# Patient Record
Sex: Male | Born: 1960
Health system: Southern US, Community
[De-identification: ages and names within clinical notes are randomized; demographics above are authoritative.]

## PROBLEM LIST (undated history)

## (undated) DIAGNOSIS — I509 Heart failure, unspecified: Secondary | ICD-10-CM

## (undated) DIAGNOSIS — I4891 Unspecified atrial fibrillation: Secondary | ICD-10-CM

## (undated) DIAGNOSIS — I10 Essential (primary) hypertension: Secondary | ICD-10-CM

## (undated) DIAGNOSIS — K760 Fatty (change of) liver, not elsewhere classified: Secondary | ICD-10-CM

## (undated) DIAGNOSIS — G4730 Sleep apnea, unspecified: Secondary | ICD-10-CM

## (undated) HISTORY — DX: Fatty (change of) liver, not elsewhere classified: K76.0

## (undated) HISTORY — DX: Essential (primary) hypertension: I10

## (undated) HISTORY — DX: Unspecified atrial fibrillation: I48.91

## (undated) HISTORY — PX: WISDOM TOOTH EXTRACTION: SHX21

## (undated) HISTORY — DX: Sleep apnea, unspecified: G47.30

## (undated) HISTORY — PX: TONSILLECTOMY: SUR1361

## (undated) HISTORY — PX: KNEE ARTHROPLASTY: SHX992

## (undated) HISTORY — PX: HIP ARTHROPLASTY: SHX981

---

## 2004-07-05 ENCOUNTER — Inpatient Hospital Stay (HOSPITAL_COMMUNITY): Admission: RE | Admit: 2004-07-05 | Discharge: 2004-07-09 | Payer: Self-pay | Admitting: Orthopedic Surgery

## 2009-12-19 ENCOUNTER — Ambulatory Visit: Payer: Self-pay | Admitting: Cardiology

## 2014-06-10 ENCOUNTER — Encounter (INDEPENDENT_AMBULATORY_CARE_PROVIDER_SITE_OTHER): Payer: Self-pay | Admitting: Ophthalmology

## 2014-06-14 ENCOUNTER — Encounter (INDEPENDENT_AMBULATORY_CARE_PROVIDER_SITE_OTHER): Payer: PRIVATE HEALTH INSURANCE | Admitting: Ophthalmology

## 2014-06-14 DIAGNOSIS — H35039 Hypertensive retinopathy, unspecified eye: Secondary | ICD-10-CM

## 2014-06-14 DIAGNOSIS — H2510 Age-related nuclear cataract, unspecified eye: Secondary | ICD-10-CM

## 2014-06-14 DIAGNOSIS — H43819 Vitreous degeneration, unspecified eye: Secondary | ICD-10-CM

## 2014-06-14 DIAGNOSIS — I10 Essential (primary) hypertension: Secondary | ICD-10-CM

## 2014-07-09 DIAGNOSIS — M545 Low back pain, unspecified: Secondary | ICD-10-CM | POA: Diagnosis not present

## 2014-07-09 DIAGNOSIS — E669 Obesity, unspecified: Secondary | ICD-10-CM | POA: Diagnosis not present

## 2014-07-09 DIAGNOSIS — I10 Essential (primary) hypertension: Secondary | ICD-10-CM | POA: Diagnosis not present

## 2014-07-09 DIAGNOSIS — J45902 Unspecified asthma with status asthmaticus: Secondary | ICD-10-CM | POA: Diagnosis not present

## 2014-07-09 DIAGNOSIS — Z5181 Encounter for therapeutic drug level monitoring: Secondary | ICD-10-CM | POA: Diagnosis not present

## 2014-07-09 DIAGNOSIS — G4733 Obstructive sleep apnea (adult) (pediatric): Secondary | ICD-10-CM | POA: Diagnosis not present

## 2014-07-09 DIAGNOSIS — F17200 Nicotine dependence, unspecified, uncomplicated: Secondary | ICD-10-CM | POA: Diagnosis not present

## 2014-07-09 DIAGNOSIS — Z79899 Other long term (current) drug therapy: Secondary | ICD-10-CM | POA: Diagnosis not present

## 2014-09-06 DIAGNOSIS — I10 Essential (primary) hypertension: Secondary | ICD-10-CM | POA: Diagnosis not present

## 2014-12-28 DIAGNOSIS — J4542 Moderate persistent asthma with status asthmaticus: Secondary | ICD-10-CM | POA: Diagnosis not present

## 2014-12-28 DIAGNOSIS — F331 Major depressive disorder, recurrent, moderate: Secondary | ICD-10-CM | POA: Diagnosis not present

## 2014-12-28 DIAGNOSIS — F17210 Nicotine dependence, cigarettes, uncomplicated: Secondary | ICD-10-CM | POA: Diagnosis not present

## 2014-12-28 DIAGNOSIS — F5221 Male erectile disorder: Secondary | ICD-10-CM | POA: Diagnosis not present

## 2014-12-28 DIAGNOSIS — I10 Essential (primary) hypertension: Secondary | ICD-10-CM | POA: Diagnosis not present

## 2014-12-28 DIAGNOSIS — Z1389 Encounter for screening for other disorder: Secondary | ICD-10-CM | POA: Diagnosis not present

## 2014-12-28 DIAGNOSIS — Z9189 Other specified personal risk factors, not elsewhere classified: Secondary | ICD-10-CM | POA: Diagnosis not present

## 2014-12-28 DIAGNOSIS — E6609 Other obesity due to excess calories: Secondary | ICD-10-CM | POA: Diagnosis not present

## 2014-12-28 DIAGNOSIS — G4733 Obstructive sleep apnea (adult) (pediatric): Secondary | ICD-10-CM | POA: Diagnosis not present

## 2014-12-28 DIAGNOSIS — K210 Gastro-esophageal reflux disease with esophagitis: Secondary | ICD-10-CM | POA: Diagnosis not present

## 2014-12-28 DIAGNOSIS — M545 Low back pain: Secondary | ICD-10-CM | POA: Diagnosis not present

## 2015-04-06 DIAGNOSIS — F17210 Nicotine dependence, cigarettes, uncomplicated: Secondary | ICD-10-CM | POA: Diagnosis not present

## 2015-04-06 DIAGNOSIS — F331 Major depressive disorder, recurrent, moderate: Secondary | ICD-10-CM | POA: Diagnosis not present

## 2015-04-06 DIAGNOSIS — E6609 Other obesity due to excess calories: Secondary | ICD-10-CM | POA: Diagnosis not present

## 2015-04-06 DIAGNOSIS — I10 Essential (primary) hypertension: Secondary | ICD-10-CM | POA: Diagnosis not present

## 2015-04-06 DIAGNOSIS — K210 Gastro-esophageal reflux disease with esophagitis: Secondary | ICD-10-CM | POA: Diagnosis not present

## 2015-04-13 DIAGNOSIS — R1011 Right upper quadrant pain: Secondary | ICD-10-CM | POA: Diagnosis not present

## 2015-04-13 DIAGNOSIS — K219 Gastro-esophageal reflux disease without esophagitis: Secondary | ICD-10-CM | POA: Diagnosis not present

## 2015-04-13 DIAGNOSIS — F331 Major depressive disorder, recurrent, moderate: Secondary | ICD-10-CM | POA: Diagnosis not present

## 2015-04-13 DIAGNOSIS — E6609 Other obesity due to excess calories: Secondary | ICD-10-CM | POA: Diagnosis not present

## 2015-04-13 DIAGNOSIS — G4733 Obstructive sleep apnea (adult) (pediatric): Secondary | ICD-10-CM | POA: Diagnosis not present

## 2015-04-13 DIAGNOSIS — I10 Essential (primary) hypertension: Secondary | ICD-10-CM | POA: Diagnosis not present

## 2015-04-13 DIAGNOSIS — F17210 Nicotine dependence, cigarettes, uncomplicated: Secondary | ICD-10-CM | POA: Diagnosis not present

## 2015-04-13 DIAGNOSIS — F5221 Male erectile disorder: Secondary | ICD-10-CM | POA: Diagnosis not present

## 2015-04-13 DIAGNOSIS — M545 Low back pain: Secondary | ICD-10-CM | POA: Diagnosis not present

## 2015-04-20 DIAGNOSIS — I10 Essential (primary) hypertension: Secondary | ICD-10-CM | POA: Diagnosis not present

## 2015-05-06 DIAGNOSIS — K219 Gastro-esophageal reflux disease without esophagitis: Secondary | ICD-10-CM | POA: Diagnosis not present

## 2015-05-06 DIAGNOSIS — I10 Essential (primary) hypertension: Secondary | ICD-10-CM | POA: Diagnosis not present

## 2015-05-09 DIAGNOSIS — I10 Essential (primary) hypertension: Secondary | ICD-10-CM | POA: Diagnosis not present

## 2015-07-19 DIAGNOSIS — F331 Major depressive disorder, recurrent, moderate: Secondary | ICD-10-CM | POA: Diagnosis not present

## 2015-07-19 DIAGNOSIS — F17210 Nicotine dependence, cigarettes, uncomplicated: Secondary | ICD-10-CM | POA: Diagnosis not present

## 2015-07-19 DIAGNOSIS — E6609 Other obesity due to excess calories: Secondary | ICD-10-CM | POA: Diagnosis not present

## 2015-07-19 DIAGNOSIS — I10 Essential (primary) hypertension: Secondary | ICD-10-CM | POA: Diagnosis not present

## 2015-07-19 DIAGNOSIS — K219 Gastro-esophageal reflux disease without esophagitis: Secondary | ICD-10-CM | POA: Diagnosis not present

## 2015-07-19 DIAGNOSIS — F5221 Male erectile disorder: Secondary | ICD-10-CM | POA: Diagnosis not present

## 2015-07-19 DIAGNOSIS — M545 Low back pain: Secondary | ICD-10-CM | POA: Diagnosis not present

## 2015-07-19 DIAGNOSIS — G4733 Obstructive sleep apnea (adult) (pediatric): Secondary | ICD-10-CM | POA: Diagnosis not present

## 2015-08-24 DIAGNOSIS — F5221 Male erectile disorder: Secondary | ICD-10-CM | POA: Diagnosis not present

## 2015-08-24 DIAGNOSIS — I10 Essential (primary) hypertension: Secondary | ICD-10-CM | POA: Diagnosis not present

## 2015-08-24 DIAGNOSIS — M545 Low back pain: Secondary | ICD-10-CM | POA: Diagnosis not present

## 2015-08-24 DIAGNOSIS — K219 Gastro-esophageal reflux disease without esophagitis: Secondary | ICD-10-CM | POA: Diagnosis not present

## 2015-08-24 DIAGNOSIS — E6609 Other obesity due to excess calories: Secondary | ICD-10-CM | POA: Diagnosis not present

## 2015-08-24 DIAGNOSIS — F17210 Nicotine dependence, cigarettes, uncomplicated: Secondary | ICD-10-CM | POA: Diagnosis not present

## 2015-08-24 DIAGNOSIS — G4733 Obstructive sleep apnea (adult) (pediatric): Secondary | ICD-10-CM | POA: Diagnosis not present

## 2015-08-24 DIAGNOSIS — F331 Major depressive disorder, recurrent, moderate: Secondary | ICD-10-CM | POA: Diagnosis not present

## 2015-08-24 DIAGNOSIS — Z23 Encounter for immunization: Secondary | ICD-10-CM | POA: Diagnosis not present

## 2015-09-22 DIAGNOSIS — J209 Acute bronchitis, unspecified: Secondary | ICD-10-CM | POA: Diagnosis not present

## 2015-09-22 DIAGNOSIS — J019 Acute sinusitis, unspecified: Secondary | ICD-10-CM | POA: Diagnosis not present

## 2015-09-26 DIAGNOSIS — R0602 Shortness of breath: Secondary | ICD-10-CM | POA: Diagnosis not present

## 2015-09-26 DIAGNOSIS — I4892 Unspecified atrial flutter: Secondary | ICD-10-CM | POA: Diagnosis not present

## 2015-09-26 DIAGNOSIS — G4733 Obstructive sleep apnea (adult) (pediatric): Secondary | ICD-10-CM | POA: Diagnosis not present

## 2015-09-26 DIAGNOSIS — I10 Essential (primary) hypertension: Secondary | ICD-10-CM | POA: Diagnosis not present

## 2015-09-26 DIAGNOSIS — I253 Aneurysm of heart: Secondary | ICD-10-CM | POA: Diagnosis not present

## 2015-09-26 DIAGNOSIS — G8929 Other chronic pain: Secondary | ICD-10-CM | POA: Diagnosis present

## 2015-09-26 DIAGNOSIS — Z79899 Other long term (current) drug therapy: Secondary | ICD-10-CM | POA: Diagnosis not present

## 2015-09-26 DIAGNOSIS — Z79891 Long term (current) use of opiate analgesic: Secondary | ICD-10-CM | POA: Diagnosis not present

## 2015-09-26 DIAGNOSIS — F329 Major depressive disorder, single episode, unspecified: Secondary | ICD-10-CM | POA: Diagnosis present

## 2015-09-26 DIAGNOSIS — M549 Dorsalgia, unspecified: Secondary | ICD-10-CM | POA: Diagnosis present

## 2015-09-26 DIAGNOSIS — Z7952 Long term (current) use of systemic steroids: Secondary | ICD-10-CM | POA: Diagnosis not present

## 2015-09-26 DIAGNOSIS — I2510 Atherosclerotic heart disease of native coronary artery without angina pectoris: Secondary | ICD-10-CM | POA: Diagnosis present

## 2015-09-26 DIAGNOSIS — Z6841 Body Mass Index (BMI) 40.0 and over, adult: Secondary | ICD-10-CM | POA: Diagnosis not present

## 2015-09-26 DIAGNOSIS — E8770 Fluid overload, unspecified: Secondary | ICD-10-CM | POA: Diagnosis not present

## 2015-09-26 DIAGNOSIS — R600 Localized edema: Secondary | ICD-10-CM | POA: Diagnosis not present

## 2015-09-26 DIAGNOSIS — I340 Nonrheumatic mitral (valve) insufficiency: Secondary | ICD-10-CM | POA: Diagnosis not present

## 2015-09-26 DIAGNOSIS — I4891 Unspecified atrial fibrillation: Secondary | ICD-10-CM | POA: Insufficient documentation

## 2015-09-26 DIAGNOSIS — Z9889 Other specified postprocedural states: Secondary | ICD-10-CM | POA: Diagnosis not present

## 2015-09-26 DIAGNOSIS — Z7951 Long term (current) use of inhaled steroids: Secondary | ICD-10-CM | POA: Diagnosis not present

## 2015-09-26 DIAGNOSIS — Z7982 Long term (current) use of aspirin: Secondary | ICD-10-CM | POA: Diagnosis not present

## 2015-09-26 DIAGNOSIS — Z87891 Personal history of nicotine dependence: Secondary | ICD-10-CM | POA: Diagnosis not present

## 2015-09-26 DIAGNOSIS — R0682 Tachypnea, not elsewhere classified: Secondary | ICD-10-CM | POA: Diagnosis not present

## 2015-10-11 DIAGNOSIS — I4891 Unspecified atrial fibrillation: Secondary | ICD-10-CM | POA: Diagnosis not present

## 2015-10-11 DIAGNOSIS — R0602 Shortness of breath: Secondary | ICD-10-CM | POA: Diagnosis not present

## 2015-10-17 DIAGNOSIS — F331 Major depressive disorder, recurrent, moderate: Secondary | ICD-10-CM | POA: Diagnosis not present

## 2015-10-17 DIAGNOSIS — I480 Paroxysmal atrial fibrillation: Secondary | ICD-10-CM | POA: Diagnosis not present

## 2015-10-17 DIAGNOSIS — I5031 Acute diastolic (congestive) heart failure: Secondary | ICD-10-CM | POA: Diagnosis not present

## 2015-10-17 DIAGNOSIS — K219 Gastro-esophageal reflux disease without esophagitis: Secondary | ICD-10-CM | POA: Diagnosis not present

## 2015-10-17 DIAGNOSIS — F17210 Nicotine dependence, cigarettes, uncomplicated: Secondary | ICD-10-CM | POA: Diagnosis not present

## 2015-10-17 DIAGNOSIS — G4733 Obstructive sleep apnea (adult) (pediatric): Secondary | ICD-10-CM | POA: Diagnosis not present

## 2015-10-17 DIAGNOSIS — I10 Essential (primary) hypertension: Secondary | ICD-10-CM | POA: Diagnosis not present

## 2015-10-17 DIAGNOSIS — M545 Low back pain: Secondary | ICD-10-CM | POA: Diagnosis not present

## 2015-12-12 DIAGNOSIS — I4891 Unspecified atrial fibrillation: Secondary | ICD-10-CM | POA: Diagnosis not present

## 2016-01-24 DIAGNOSIS — K219 Gastro-esophageal reflux disease without esophagitis: Secondary | ICD-10-CM | POA: Diagnosis not present

## 2016-01-24 DIAGNOSIS — F5221 Male erectile disorder: Secondary | ICD-10-CM | POA: Diagnosis not present

## 2016-01-24 DIAGNOSIS — F331 Major depressive disorder, recurrent, moderate: Secondary | ICD-10-CM | POA: Diagnosis not present

## 2016-01-24 DIAGNOSIS — F17210 Nicotine dependence, cigarettes, uncomplicated: Secondary | ICD-10-CM | POA: Diagnosis not present

## 2016-01-24 DIAGNOSIS — E6609 Other obesity due to excess calories: Secondary | ICD-10-CM | POA: Diagnosis not present

## 2016-01-24 DIAGNOSIS — G4733 Obstructive sleep apnea (adult) (pediatric): Secondary | ICD-10-CM | POA: Diagnosis not present

## 2016-01-24 DIAGNOSIS — E876 Hypokalemia: Secondary | ICD-10-CM | POA: Diagnosis not present

## 2016-01-24 DIAGNOSIS — Z9189 Other specified personal risk factors, not elsewhere classified: Secondary | ICD-10-CM | POA: Diagnosis not present

## 2016-01-24 DIAGNOSIS — I10 Essential (primary) hypertension: Secondary | ICD-10-CM | POA: Diagnosis not present

## 2016-01-24 DIAGNOSIS — I480 Paroxysmal atrial fibrillation: Secondary | ICD-10-CM | POA: Diagnosis not present

## 2016-02-28 DIAGNOSIS — G4733 Obstructive sleep apnea (adult) (pediatric): Secondary | ICD-10-CM | POA: Diagnosis not present

## 2016-02-28 DIAGNOSIS — F331 Major depressive disorder, recurrent, moderate: Secondary | ICD-10-CM | POA: Diagnosis not present

## 2016-02-28 DIAGNOSIS — I10 Essential (primary) hypertension: Secondary | ICD-10-CM | POA: Diagnosis not present

## 2016-02-28 DIAGNOSIS — K219 Gastro-esophageal reflux disease without esophagitis: Secondary | ICD-10-CM | POA: Diagnosis not present

## 2016-04-04 DIAGNOSIS — Z1211 Encounter for screening for malignant neoplasm of colon: Secondary | ICD-10-CM | POA: Diagnosis not present

## 2016-04-30 DIAGNOSIS — G4733 Obstructive sleep apnea (adult) (pediatric): Secondary | ICD-10-CM | POA: Insufficient documentation

## 2016-04-30 DIAGNOSIS — I517 Cardiomegaly: Secondary | ICD-10-CM | POA: Diagnosis not present

## 2016-04-30 DIAGNOSIS — Z720 Tobacco use: Secondary | ICD-10-CM | POA: Insufficient documentation

## 2016-04-30 DIAGNOSIS — Z7952 Long term (current) use of systemic steroids: Secondary | ICD-10-CM | POA: Diagnosis not present

## 2016-04-30 DIAGNOSIS — J811 Chronic pulmonary edema: Secondary | ICD-10-CM | POA: Diagnosis not present

## 2016-04-30 DIAGNOSIS — Z79891 Long term (current) use of opiate analgesic: Secondary | ICD-10-CM | POA: Diagnosis not present

## 2016-04-30 DIAGNOSIS — K219 Gastro-esophageal reflux disease without esophagitis: Secondary | ICD-10-CM | POA: Insufficient documentation

## 2016-04-30 DIAGNOSIS — R0602 Shortness of breath: Secondary | ICD-10-CM | POA: Diagnosis not present

## 2016-04-30 DIAGNOSIS — I4891 Unspecified atrial fibrillation: Secondary | ICD-10-CM | POA: Diagnosis not present

## 2016-04-30 DIAGNOSIS — Z79899 Other long term (current) drug therapy: Secondary | ICD-10-CM | POA: Diagnosis not present

## 2016-04-30 DIAGNOSIS — R079 Chest pain, unspecified: Secondary | ICD-10-CM | POA: Diagnosis not present

## 2016-04-30 DIAGNOSIS — I10 Essential (primary) hypertension: Secondary | ICD-10-CM | POA: Insufficient documentation

## 2016-04-30 DIAGNOSIS — E876 Hypokalemia: Secondary | ICD-10-CM | POA: Diagnosis not present

## 2016-04-30 DIAGNOSIS — F329 Major depressive disorder, single episode, unspecified: Secondary | ICD-10-CM | POA: Diagnosis not present

## 2016-04-30 DIAGNOSIS — Z7951 Long term (current) use of inhaled steroids: Secondary | ICD-10-CM | POA: Diagnosis not present

## 2016-04-30 DIAGNOSIS — Z7982 Long term (current) use of aspirin: Secondary | ICD-10-CM | POA: Diagnosis not present

## 2016-05-01 DIAGNOSIS — G4733 Obstructive sleep apnea (adult) (pediatric): Secondary | ICD-10-CM | POA: Diagnosis not present

## 2016-05-01 DIAGNOSIS — Z9989 Dependence on other enabling machines and devices: Secondary | ICD-10-CM | POA: Diagnosis not present

## 2016-05-01 DIAGNOSIS — I10 Essential (primary) hypertension: Secondary | ICD-10-CM | POA: Diagnosis not present

## 2016-05-01 DIAGNOSIS — I4891 Unspecified atrial fibrillation: Secondary | ICD-10-CM | POA: Diagnosis not present

## 2016-05-02 DIAGNOSIS — I4891 Unspecified atrial fibrillation: Secondary | ICD-10-CM | POA: Diagnosis not present

## 2016-05-02 DIAGNOSIS — G4733 Obstructive sleep apnea (adult) (pediatric): Secondary | ICD-10-CM | POA: Diagnosis not present

## 2016-05-02 DIAGNOSIS — Z9989 Dependence on other enabling machines and devices: Secondary | ICD-10-CM | POA: Diagnosis not present

## 2016-05-02 DIAGNOSIS — I10 Essential (primary) hypertension: Secondary | ICD-10-CM | POA: Diagnosis not present

## 2016-05-03 DIAGNOSIS — Z452 Encounter for adjustment and management of vascular access device: Secondary | ICD-10-CM | POA: Diagnosis not present

## 2016-05-03 DIAGNOSIS — J811 Chronic pulmonary edema: Secondary | ICD-10-CM | POA: Diagnosis not present

## 2016-05-03 DIAGNOSIS — I517 Cardiomegaly: Secondary | ICD-10-CM | POA: Diagnosis not present

## 2016-05-03 DIAGNOSIS — I4891 Unspecified atrial fibrillation: Secondary | ICD-10-CM | POA: Diagnosis not present

## 2016-05-04 DIAGNOSIS — I4891 Unspecified atrial fibrillation: Secondary | ICD-10-CM | POA: Diagnosis not present

## 2016-05-05 DIAGNOSIS — I10 Essential (primary) hypertension: Secondary | ICD-10-CM | POA: Diagnosis not present

## 2016-05-05 DIAGNOSIS — I4891 Unspecified atrial fibrillation: Secondary | ICD-10-CM | POA: Diagnosis not present

## 2016-05-06 DIAGNOSIS — I10 Essential (primary) hypertension: Secondary | ICD-10-CM | POA: Diagnosis not present

## 2016-05-06 DIAGNOSIS — I4891 Unspecified atrial fibrillation: Secondary | ICD-10-CM | POA: Diagnosis not present

## 2016-05-07 DIAGNOSIS — I4891 Unspecified atrial fibrillation: Secondary | ICD-10-CM | POA: Diagnosis not present

## 2016-05-07 DIAGNOSIS — I10 Essential (primary) hypertension: Secondary | ICD-10-CM | POA: Diagnosis not present

## 2016-05-25 DIAGNOSIS — I340 Nonrheumatic mitral (valve) insufficiency: Secondary | ICD-10-CM | POA: Insufficient documentation

## 2016-05-25 DIAGNOSIS — I480 Paroxysmal atrial fibrillation: Secondary | ICD-10-CM | POA: Insufficient documentation

## 2016-05-25 DIAGNOSIS — I4891 Unspecified atrial fibrillation: Secondary | ICD-10-CM | POA: Diagnosis not present

## 2016-05-25 DIAGNOSIS — I493 Ventricular premature depolarization: Secondary | ICD-10-CM | POA: Diagnosis not present

## 2016-05-25 DIAGNOSIS — R9431 Abnormal electrocardiogram [ECG] [EKG]: Secondary | ICD-10-CM | POA: Diagnosis not present

## 2016-05-30 DIAGNOSIS — I480 Paroxysmal atrial fibrillation: Secondary | ICD-10-CM | POA: Diagnosis not present

## 2016-05-30 DIAGNOSIS — Z9989 Dependence on other enabling machines and devices: Secondary | ICD-10-CM | POA: Diagnosis not present

## 2016-05-30 DIAGNOSIS — I10 Essential (primary) hypertension: Secondary | ICD-10-CM | POA: Diagnosis not present

## 2016-05-30 DIAGNOSIS — I340 Nonrheumatic mitral (valve) insufficiency: Secondary | ICD-10-CM | POA: Diagnosis not present

## 2016-05-30 DIAGNOSIS — G4733 Obstructive sleep apnea (adult) (pediatric): Secondary | ICD-10-CM | POA: Diagnosis not present

## 2016-06-04 DIAGNOSIS — F17210 Nicotine dependence, cigarettes, uncomplicated: Secondary | ICD-10-CM | POA: Diagnosis not present

## 2016-06-04 DIAGNOSIS — I10 Essential (primary) hypertension: Secondary | ICD-10-CM | POA: Diagnosis not present

## 2016-06-04 DIAGNOSIS — I5031 Acute diastolic (congestive) heart failure: Secondary | ICD-10-CM | POA: Diagnosis not present

## 2016-06-04 DIAGNOSIS — I480 Paroxysmal atrial fibrillation: Secondary | ICD-10-CM | POA: Diagnosis not present

## 2016-06-04 DIAGNOSIS — G4733 Obstructive sleep apnea (adult) (pediatric): Secondary | ICD-10-CM | POA: Diagnosis not present

## 2016-06-04 DIAGNOSIS — F331 Major depressive disorder, recurrent, moderate: Secondary | ICD-10-CM | POA: Diagnosis not present

## 2016-06-04 DIAGNOSIS — K219 Gastro-esophageal reflux disease without esophagitis: Secondary | ICD-10-CM | POA: Diagnosis not present

## 2016-06-11 DIAGNOSIS — R0602 Shortness of breath: Secondary | ICD-10-CM | POA: Diagnosis not present

## 2016-07-04 DIAGNOSIS — Z5181 Encounter for therapeutic drug level monitoring: Secondary | ICD-10-CM | POA: Diagnosis not present

## 2016-07-04 DIAGNOSIS — Z79899 Other long term (current) drug therapy: Secondary | ICD-10-CM | POA: Diagnosis not present

## 2016-08-16 DIAGNOSIS — M1711 Unilateral primary osteoarthritis, right knee: Secondary | ICD-10-CM | POA: Diagnosis not present

## 2016-08-20 DIAGNOSIS — M179 Osteoarthritis of knee, unspecified: Secondary | ICD-10-CM | POA: Diagnosis not present

## 2016-08-20 DIAGNOSIS — M1711 Unilateral primary osteoarthritis, right knee: Secondary | ICD-10-CM | POA: Diagnosis not present

## 2016-08-31 DIAGNOSIS — R9431 Abnormal electrocardiogram [ECG] [EKG]: Secondary | ICD-10-CM | POA: Diagnosis not present

## 2016-08-31 DIAGNOSIS — I4891 Unspecified atrial fibrillation: Secondary | ICD-10-CM | POA: Diagnosis not present

## 2016-08-31 DIAGNOSIS — I481 Persistent atrial fibrillation: Secondary | ICD-10-CM | POA: Diagnosis not present

## 2016-09-19 DIAGNOSIS — E6609 Other obesity due to excess calories: Secondary | ICD-10-CM | POA: Diagnosis not present

## 2016-09-19 DIAGNOSIS — I5031 Acute diastolic (congestive) heart failure: Secondary | ICD-10-CM | POA: Diagnosis not present

## 2016-09-19 DIAGNOSIS — G4733 Obstructive sleep apnea (adult) (pediatric): Secondary | ICD-10-CM | POA: Diagnosis not present

## 2016-09-19 DIAGNOSIS — I10 Essential (primary) hypertension: Secondary | ICD-10-CM | POA: Diagnosis not present

## 2016-09-19 DIAGNOSIS — I480 Paroxysmal atrial fibrillation: Secondary | ICD-10-CM | POA: Diagnosis not present

## 2016-09-19 DIAGNOSIS — F5221 Male erectile disorder: Secondary | ICD-10-CM | POA: Diagnosis not present

## 2016-09-19 DIAGNOSIS — M545 Low back pain: Secondary | ICD-10-CM | POA: Diagnosis not present

## 2016-09-19 DIAGNOSIS — F331 Major depressive disorder, recurrent, moderate: Secondary | ICD-10-CM | POA: Diagnosis not present

## 2016-09-19 DIAGNOSIS — K219 Gastro-esophageal reflux disease without esophagitis: Secondary | ICD-10-CM | POA: Diagnosis not present

## 2016-09-19 DIAGNOSIS — F17210 Nicotine dependence, cigarettes, uncomplicated: Secondary | ICD-10-CM | POA: Diagnosis not present

## 2016-12-18 DIAGNOSIS — F331 Major depressive disorder, recurrent, moderate: Secondary | ICD-10-CM | POA: Diagnosis not present

## 2016-12-18 DIAGNOSIS — F17210 Nicotine dependence, cigarettes, uncomplicated: Secondary | ICD-10-CM | POA: Diagnosis not present

## 2016-12-18 DIAGNOSIS — I10 Essential (primary) hypertension: Secondary | ICD-10-CM | POA: Diagnosis not present

## 2016-12-18 DIAGNOSIS — G4733 Obstructive sleep apnea (adult) (pediatric): Secondary | ICD-10-CM | POA: Diagnosis not present

## 2016-12-18 DIAGNOSIS — E6609 Other obesity due to excess calories: Secondary | ICD-10-CM | POA: Diagnosis not present

## 2016-12-18 DIAGNOSIS — K219 Gastro-esophageal reflux disease without esophagitis: Secondary | ICD-10-CM | POA: Diagnosis not present

## 2016-12-18 DIAGNOSIS — M545 Low back pain: Secondary | ICD-10-CM | POA: Diagnosis not present

## 2016-12-18 DIAGNOSIS — I480 Paroxysmal atrial fibrillation: Secondary | ICD-10-CM | POA: Diagnosis not present

## 2017-03-14 DIAGNOSIS — G4733 Obstructive sleep apnea (adult) (pediatric): Secondary | ICD-10-CM | POA: Diagnosis not present

## 2017-03-14 DIAGNOSIS — E162 Hypoglycemia, unspecified: Secondary | ICD-10-CM | POA: Diagnosis not present

## 2017-03-14 DIAGNOSIS — F331 Major depressive disorder, recurrent, moderate: Secondary | ICD-10-CM | POA: Diagnosis not present

## 2017-03-14 DIAGNOSIS — F17210 Nicotine dependence, cigarettes, uncomplicated: Secondary | ICD-10-CM | POA: Diagnosis not present

## 2017-03-14 DIAGNOSIS — Z9189 Other specified personal risk factors, not elsewhere classified: Secondary | ICD-10-CM | POA: Diagnosis not present

## 2017-03-14 DIAGNOSIS — E876 Hypokalemia: Secondary | ICD-10-CM | POA: Diagnosis not present

## 2017-03-14 DIAGNOSIS — I10 Essential (primary) hypertension: Secondary | ICD-10-CM | POA: Diagnosis not present

## 2017-03-14 DIAGNOSIS — K210 Gastro-esophageal reflux disease with esophagitis: Secondary | ICD-10-CM | POA: Diagnosis not present

## 2017-03-19 DIAGNOSIS — K219 Gastro-esophageal reflux disease without esophagitis: Secondary | ICD-10-CM | POA: Diagnosis not present

## 2017-03-19 DIAGNOSIS — F331 Major depressive disorder, recurrent, moderate: Secondary | ICD-10-CM | POA: Diagnosis not present

## 2017-03-19 DIAGNOSIS — M545 Low back pain: Secondary | ICD-10-CM | POA: Diagnosis not present

## 2017-03-19 DIAGNOSIS — Z1389 Encounter for screening for other disorder: Secondary | ICD-10-CM | POA: Diagnosis not present

## 2017-03-19 DIAGNOSIS — F5221 Male erectile disorder: Secondary | ICD-10-CM | POA: Diagnosis not present

## 2017-03-19 DIAGNOSIS — Z23 Encounter for immunization: Secondary | ICD-10-CM | POA: Diagnosis not present

## 2017-03-19 DIAGNOSIS — I480 Paroxysmal atrial fibrillation: Secondary | ICD-10-CM | POA: Diagnosis not present

## 2017-03-19 DIAGNOSIS — I10 Essential (primary) hypertension: Secondary | ICD-10-CM | POA: Diagnosis not present

## 2017-06-24 DIAGNOSIS — I5032 Chronic diastolic (congestive) heart failure: Secondary | ICD-10-CM | POA: Diagnosis not present

## 2017-06-24 DIAGNOSIS — Z6841 Body Mass Index (BMI) 40.0 and over, adult: Secondary | ICD-10-CM | POA: Diagnosis not present

## 2017-06-24 DIAGNOSIS — G4733 Obstructive sleep apnea (adult) (pediatric): Secondary | ICD-10-CM | POA: Diagnosis not present

## 2017-06-24 DIAGNOSIS — Z9189 Other specified personal risk factors, not elsewhere classified: Secondary | ICD-10-CM | POA: Diagnosis not present

## 2017-06-24 DIAGNOSIS — F331 Major depressive disorder, recurrent, moderate: Secondary | ICD-10-CM | POA: Diagnosis not present

## 2017-06-24 DIAGNOSIS — I480 Paroxysmal atrial fibrillation: Secondary | ICD-10-CM | POA: Diagnosis not present

## 2017-06-24 DIAGNOSIS — I10 Essential (primary) hypertension: Secondary | ICD-10-CM | POA: Diagnosis not present

## 2017-06-24 DIAGNOSIS — F5221 Male erectile disorder: Secondary | ICD-10-CM | POA: Diagnosis not present

## 2017-06-24 DIAGNOSIS — F17210 Nicotine dependence, cigarettes, uncomplicated: Secondary | ICD-10-CM | POA: Diagnosis not present

## 2017-06-24 DIAGNOSIS — K219 Gastro-esophageal reflux disease without esophagitis: Secondary | ICD-10-CM | POA: Diagnosis not present

## 2017-06-24 DIAGNOSIS — M545 Low back pain: Secondary | ICD-10-CM | POA: Diagnosis not present

## 2017-09-27 DIAGNOSIS — E162 Hypoglycemia, unspecified: Secondary | ICD-10-CM | POA: Diagnosis not present

## 2017-09-27 DIAGNOSIS — G4733 Obstructive sleep apnea (adult) (pediatric): Secondary | ICD-10-CM | POA: Diagnosis not present

## 2017-09-27 DIAGNOSIS — E876 Hypokalemia: Secondary | ICD-10-CM | POA: Diagnosis not present

## 2017-09-27 DIAGNOSIS — K210 Gastro-esophageal reflux disease with esophagitis: Secondary | ICD-10-CM | POA: Diagnosis not present

## 2017-09-27 DIAGNOSIS — I10 Essential (primary) hypertension: Secondary | ICD-10-CM | POA: Diagnosis not present

## 2017-09-27 DIAGNOSIS — F17210 Nicotine dependence, cigarettes, uncomplicated: Secondary | ICD-10-CM | POA: Diagnosis not present

## 2017-10-01 DIAGNOSIS — F331 Major depressive disorder, recurrent, moderate: Secondary | ICD-10-CM | POA: Diagnosis not present

## 2017-10-01 DIAGNOSIS — I10 Essential (primary) hypertension: Secondary | ICD-10-CM | POA: Diagnosis not present

## 2017-10-01 DIAGNOSIS — I480 Paroxysmal atrial fibrillation: Secondary | ICD-10-CM | POA: Diagnosis not present

## 2017-10-01 DIAGNOSIS — F17210 Nicotine dependence, cigarettes, uncomplicated: Secondary | ICD-10-CM | POA: Diagnosis not present

## 2017-10-01 DIAGNOSIS — I5032 Chronic diastolic (congestive) heart failure: Secondary | ICD-10-CM | POA: Diagnosis not present

## 2017-10-01 DIAGNOSIS — Z6841 Body Mass Index (BMI) 40.0 and over, adult: Secondary | ICD-10-CM | POA: Diagnosis not present

## 2017-10-01 DIAGNOSIS — K219 Gastro-esophageal reflux disease without esophagitis: Secondary | ICD-10-CM | POA: Diagnosis not present

## 2017-10-01 DIAGNOSIS — Z23 Encounter for immunization: Secondary | ICD-10-CM | POA: Diagnosis not present

## 2018-03-26 DIAGNOSIS — Z79891 Long term (current) use of opiate analgesic: Secondary | ICD-10-CM | POA: Diagnosis not present

## 2018-03-26 DIAGNOSIS — I10 Essential (primary) hypertension: Secondary | ICD-10-CM | POA: Diagnosis not present

## 2018-03-26 DIAGNOSIS — K219 Gastro-esophageal reflux disease without esophagitis: Secondary | ICD-10-CM | POA: Diagnosis not present

## 2018-03-26 DIAGNOSIS — F17210 Nicotine dependence, cigarettes, uncomplicated: Secondary | ICD-10-CM | POA: Diagnosis not present

## 2018-03-26 DIAGNOSIS — M545 Low back pain: Secondary | ICD-10-CM | POA: Diagnosis not present

## 2018-03-26 DIAGNOSIS — Z1331 Encounter for screening for depression: Secondary | ICD-10-CM | POA: Diagnosis not present

## 2018-03-26 DIAGNOSIS — Z23 Encounter for immunization: Secondary | ICD-10-CM | POA: Diagnosis not present

## 2018-03-26 DIAGNOSIS — Z6841 Body Mass Index (BMI) 40.0 and over, adult: Secondary | ICD-10-CM | POA: Diagnosis not present

## 2018-03-26 DIAGNOSIS — Z1389 Encounter for screening for other disorder: Secondary | ICD-10-CM | POA: Diagnosis not present

## 2018-03-26 DIAGNOSIS — F331 Major depressive disorder, recurrent, moderate: Secondary | ICD-10-CM | POA: Diagnosis not present

## 2018-03-26 DIAGNOSIS — Z0001 Encounter for general adult medical examination with abnormal findings: Secondary | ICD-10-CM | POA: Diagnosis not present

## 2018-03-26 DIAGNOSIS — Z9189 Other specified personal risk factors, not elsewhere classified: Secondary | ICD-10-CM | POA: Diagnosis not present

## 2018-03-26 DIAGNOSIS — G4733 Obstructive sleep apnea (adult) (pediatric): Secondary | ICD-10-CM | POA: Diagnosis not present

## 2018-03-26 DIAGNOSIS — I5032 Chronic diastolic (congestive) heart failure: Secondary | ICD-10-CM | POA: Diagnosis not present

## 2018-03-26 DIAGNOSIS — I480 Paroxysmal atrial fibrillation: Secondary | ICD-10-CM | POA: Diagnosis not present

## 2018-04-08 ENCOUNTER — Other Ambulatory Visit: Payer: Self-pay | Admitting: Family Medicine

## 2018-04-08 DIAGNOSIS — M545 Low back pain: Secondary | ICD-10-CM

## 2018-05-20 DIAGNOSIS — Z9989 Dependence on other enabling machines and devices: Secondary | ICD-10-CM | POA: Diagnosis not present

## 2018-05-20 DIAGNOSIS — I10 Essential (primary) hypertension: Secondary | ICD-10-CM | POA: Diagnosis not present

## 2018-05-20 DIAGNOSIS — G4733 Obstructive sleep apnea (adult) (pediatric): Secondary | ICD-10-CM | POA: Diagnosis not present

## 2018-05-20 DIAGNOSIS — I481 Persistent atrial fibrillation: Secondary | ICD-10-CM | POA: Diagnosis not present

## 2018-06-27 DIAGNOSIS — F5221 Male erectile disorder: Secondary | ICD-10-CM | POA: Diagnosis not present

## 2018-06-27 DIAGNOSIS — Z6841 Body Mass Index (BMI) 40.0 and over, adult: Secondary | ICD-10-CM | POA: Diagnosis not present

## 2018-06-27 DIAGNOSIS — Z1389 Encounter for screening for other disorder: Secondary | ICD-10-CM | POA: Diagnosis not present

## 2018-06-27 DIAGNOSIS — Z79891 Long term (current) use of opiate analgesic: Secondary | ICD-10-CM | POA: Diagnosis not present

## 2018-06-27 DIAGNOSIS — I10 Essential (primary) hypertension: Secondary | ICD-10-CM | POA: Diagnosis not present

## 2018-06-27 DIAGNOSIS — F17210 Nicotine dependence, cigarettes, uncomplicated: Secondary | ICD-10-CM | POA: Diagnosis not present

## 2018-06-27 DIAGNOSIS — I480 Paroxysmal atrial fibrillation: Secondary | ICD-10-CM | POA: Diagnosis not present

## 2018-07-08 DIAGNOSIS — F17200 Nicotine dependence, unspecified, uncomplicated: Secondary | ICD-10-CM | POA: Diagnosis not present

## 2018-07-08 DIAGNOSIS — M8580 Other specified disorders of bone density and structure, unspecified site: Secondary | ICD-10-CM | POA: Diagnosis not present

## 2018-07-08 DIAGNOSIS — Z7952 Long term (current) use of systemic steroids: Secondary | ICD-10-CM | POA: Diagnosis not present

## 2018-08-08 DIAGNOSIS — R0602 Shortness of breath: Secondary | ICD-10-CM | POA: Diagnosis not present

## 2018-08-08 DIAGNOSIS — Z6841 Body Mass Index (BMI) 40.0 and over, adult: Secondary | ICD-10-CM | POA: Diagnosis not present

## 2018-08-08 DIAGNOSIS — J209 Acute bronchitis, unspecified: Secondary | ICD-10-CM | POA: Diagnosis not present

## 2018-08-08 DIAGNOSIS — R05 Cough: Secondary | ICD-10-CM | POA: Diagnosis not present

## 2018-08-08 DIAGNOSIS — R300 Dysuria: Secondary | ICD-10-CM | POA: Diagnosis not present

## 2018-08-15 DIAGNOSIS — R0602 Shortness of breath: Secondary | ICD-10-CM | POA: Diagnosis not present

## 2018-08-15 DIAGNOSIS — R600 Localized edema: Secondary | ICD-10-CM | POA: Diagnosis not present

## 2018-08-15 DIAGNOSIS — R3911 Hesitancy of micturition: Secondary | ICD-10-CM | POA: Diagnosis not present

## 2018-08-15 DIAGNOSIS — M793 Panniculitis, unspecified: Secondary | ICD-10-CM | POA: Diagnosis not present

## 2018-08-15 DIAGNOSIS — R350 Frequency of micturition: Secondary | ICD-10-CM | POA: Diagnosis not present

## 2018-09-22 DIAGNOSIS — R739 Hyperglycemia, unspecified: Secondary | ICD-10-CM | POA: Diagnosis not present

## 2018-09-22 DIAGNOSIS — I10 Essential (primary) hypertension: Secondary | ICD-10-CM | POA: Diagnosis not present

## 2018-09-29 DIAGNOSIS — G252 Other specified forms of tremor: Secondary | ICD-10-CM | POA: Diagnosis not present

## 2018-09-29 DIAGNOSIS — I10 Essential (primary) hypertension: Secondary | ICD-10-CM | POA: Diagnosis not present

## 2018-09-29 DIAGNOSIS — I480 Paroxysmal atrial fibrillation: Secondary | ICD-10-CM | POA: Diagnosis not present

## 2018-09-29 DIAGNOSIS — Z23 Encounter for immunization: Secondary | ICD-10-CM | POA: Diagnosis not present

## 2018-09-29 DIAGNOSIS — F17210 Nicotine dependence, cigarettes, uncomplicated: Secondary | ICD-10-CM | POA: Diagnosis not present

## 2018-09-29 DIAGNOSIS — Z6841 Body Mass Index (BMI) 40.0 and over, adult: Secondary | ICD-10-CM | POA: Diagnosis not present

## 2018-09-29 DIAGNOSIS — F331 Major depressive disorder, recurrent, moderate: Secondary | ICD-10-CM | POA: Diagnosis not present

## 2019-01-05 DIAGNOSIS — Z79891 Long term (current) use of opiate analgesic: Secondary | ICD-10-CM | POA: Diagnosis not present

## 2019-01-05 DIAGNOSIS — I5032 Chronic diastolic (congestive) heart failure: Secondary | ICD-10-CM | POA: Diagnosis not present

## 2019-01-05 DIAGNOSIS — I480 Paroxysmal atrial fibrillation: Secondary | ICD-10-CM | POA: Diagnosis not present

## 2019-01-05 DIAGNOSIS — F5221 Male erectile disorder: Secondary | ICD-10-CM | POA: Diagnosis not present

## 2019-01-05 DIAGNOSIS — G252 Other specified forms of tremor: Secondary | ICD-10-CM | POA: Diagnosis not present

## 2019-01-05 DIAGNOSIS — I10 Essential (primary) hypertension: Secondary | ICD-10-CM | POA: Diagnosis not present

## 2019-01-05 DIAGNOSIS — Z6841 Body Mass Index (BMI) 40.0 and over, adult: Secondary | ICD-10-CM | POA: Diagnosis not present

## 2019-01-05 DIAGNOSIS — F331 Major depressive disorder, recurrent, moderate: Secondary | ICD-10-CM | POA: Diagnosis not present

## 2019-01-28 DIAGNOSIS — E876 Hypokalemia: Secondary | ICD-10-CM | POA: Diagnosis not present

## 2019-02-16 DIAGNOSIS — E876 Hypokalemia: Secondary | ICD-10-CM | POA: Diagnosis not present

## 2019-03-05 DIAGNOSIS — J0101 Acute recurrent maxillary sinusitis: Secondary | ICD-10-CM | POA: Diagnosis not present

## 2019-03-05 DIAGNOSIS — I10 Essential (primary) hypertension: Secondary | ICD-10-CM | POA: Diagnosis not present

## 2019-03-05 DIAGNOSIS — I5032 Chronic diastolic (congestive) heart failure: Secondary | ICD-10-CM | POA: Diagnosis not present

## 2019-03-13 DIAGNOSIS — E876 Hypokalemia: Secondary | ICD-10-CM | POA: Diagnosis not present

## 2019-03-13 DIAGNOSIS — F17210 Nicotine dependence, cigarettes, uncomplicated: Secondary | ICD-10-CM | POA: Diagnosis present

## 2019-03-13 DIAGNOSIS — R0602 Shortness of breath: Secondary | ICD-10-CM | POA: Diagnosis not present

## 2019-03-13 DIAGNOSIS — R000 Tachycardia, unspecified: Secondary | ICD-10-CM | POA: Diagnosis not present

## 2019-03-13 DIAGNOSIS — Z7901 Long term (current) use of anticoagulants: Secondary | ICD-10-CM | POA: Diagnosis not present

## 2019-03-13 DIAGNOSIS — I10 Essential (primary) hypertension: Secondary | ICD-10-CM | POA: Diagnosis not present

## 2019-03-13 DIAGNOSIS — N400 Enlarged prostate without lower urinary tract symptoms: Secondary | ICD-10-CM | POA: Diagnosis present

## 2019-03-13 DIAGNOSIS — N529 Male erectile dysfunction, unspecified: Secondary | ICD-10-CM | POA: Diagnosis not present

## 2019-03-13 DIAGNOSIS — Z6841 Body Mass Index (BMI) 40.0 and over, adult: Secondary | ICD-10-CM | POA: Diagnosis not present

## 2019-03-13 DIAGNOSIS — I5033 Acute on chronic diastolic (congestive) heart failure: Secondary | ICD-10-CM | POA: Diagnosis not present

## 2019-03-13 DIAGNOSIS — I110 Hypertensive heart disease with heart failure: Secondary | ICD-10-CM | POA: Diagnosis not present

## 2019-03-13 DIAGNOSIS — M47816 Spondylosis without myelopathy or radiculopathy, lumbar region: Secondary | ICD-10-CM | POA: Diagnosis not present

## 2019-03-13 DIAGNOSIS — Z79899 Other long term (current) drug therapy: Secondary | ICD-10-CM | POA: Diagnosis not present

## 2019-03-13 DIAGNOSIS — Z96641 Presence of right artificial hip joint: Secondary | ICD-10-CM | POA: Diagnosis not present

## 2019-03-13 DIAGNOSIS — I472 Ventricular tachycardia: Secondary | ICD-10-CM | POA: Diagnosis not present

## 2019-03-13 DIAGNOSIS — I4891 Unspecified atrial fibrillation: Secondary | ICD-10-CM | POA: Diagnosis not present

## 2019-03-13 DIAGNOSIS — Z7952 Long term (current) use of systemic steroids: Secondary | ICD-10-CM | POA: Diagnosis not present

## 2019-03-13 DIAGNOSIS — M5136 Other intervertebral disc degeneration, lumbar region: Secondary | ICD-10-CM | POA: Diagnosis present

## 2019-03-13 DIAGNOSIS — K219 Gastro-esophageal reflux disease without esophagitis: Secondary | ICD-10-CM | POA: Diagnosis present

## 2019-03-13 DIAGNOSIS — R451 Restlessness and agitation: Secondary | ICD-10-CM | POA: Diagnosis present

## 2019-03-13 DIAGNOSIS — I482 Chronic atrial fibrillation, unspecified: Secondary | ICD-10-CM | POA: Diagnosis not present

## 2019-03-13 DIAGNOSIS — G4733 Obstructive sleep apnea (adult) (pediatric): Secondary | ICD-10-CM | POA: Diagnosis present

## 2019-03-13 DIAGNOSIS — F3340 Major depressive disorder, recurrent, in remission, unspecified: Secondary | ICD-10-CM | POA: Diagnosis not present

## 2019-03-13 DIAGNOSIS — G609 Hereditary and idiopathic neuropathy, unspecified: Secondary | ICD-10-CM | POA: Diagnosis not present

## 2019-04-02 DIAGNOSIS — I5032 Chronic diastolic (congestive) heart failure: Secondary | ICD-10-CM | POA: Diagnosis not present

## 2019-04-02 DIAGNOSIS — F331 Major depressive disorder, recurrent, moderate: Secondary | ICD-10-CM | POA: Diagnosis not present

## 2019-04-02 DIAGNOSIS — Z79891 Long term (current) use of opiate analgesic: Secondary | ICD-10-CM | POA: Diagnosis not present

## 2019-04-02 DIAGNOSIS — K219 Gastro-esophageal reflux disease without esophagitis: Secondary | ICD-10-CM | POA: Diagnosis not present

## 2019-04-02 DIAGNOSIS — I480 Paroxysmal atrial fibrillation: Secondary | ICD-10-CM | POA: Diagnosis not present

## 2019-04-02 DIAGNOSIS — G4733 Obstructive sleep apnea (adult) (pediatric): Secondary | ICD-10-CM | POA: Diagnosis not present

## 2019-04-02 DIAGNOSIS — F5221 Male erectile disorder: Secondary | ICD-10-CM | POA: Diagnosis not present

## 2019-04-02 DIAGNOSIS — I10 Essential (primary) hypertension: Secondary | ICD-10-CM | POA: Diagnosis not present

## 2019-04-15 DIAGNOSIS — I4891 Unspecified atrial fibrillation: Secondary | ICD-10-CM | POA: Diagnosis not present

## 2019-05-15 DIAGNOSIS — I4811 Longstanding persistent atrial fibrillation: Secondary | ICD-10-CM | POA: Diagnosis not present

## 2019-05-15 DIAGNOSIS — Z6841 Body Mass Index (BMI) 40.0 and over, adult: Secondary | ICD-10-CM | POA: Diagnosis not present

## 2019-05-15 DIAGNOSIS — I5042 Chronic combined systolic (congestive) and diastolic (congestive) heart failure: Secondary | ICD-10-CM | POA: Diagnosis not present

## 2019-05-15 DIAGNOSIS — I420 Dilated cardiomyopathy: Secondary | ICD-10-CM | POA: Diagnosis not present

## 2019-06-04 DIAGNOSIS — R270 Ataxia, unspecified: Secondary | ICD-10-CM | POA: Diagnosis not present

## 2019-06-23 NOTE — Progress Notes (Addendum)
Virtual Visit via Video Note The purpose of this virtual visit is to provide medical care while limiting exposure to the novel coronavirus.    Consent was obtained for video visit:  Yes Answered questions that patient had about telehealth interaction:  Yes I discussed the limitations, risks, security and privacy concerns of performing an evaluation and management service by telemedicine. I also discussed with the patient that there may be a patient responsible charge related to this service. The patient expressed understanding and agreed to proceed.  Pt location: Home Physician Location: Home Name of referring provider:  Caryl Bis, MD I connected with Timothy Macias at patients initiation/request on 06/24/2019 at  8:30 AM EDT by video enabled telemedicine application and verified that I am speaking with the correct person using two identifiers. Pt MRN:  [id number] Pt DOB:  05-08-1961 Video Participants:  Timothy Macias   History of Present Illness:  Timothy Macias is a 58 year old man with atrial fibrillation, on anticoagulation, who presents for dizziness.  History supplemented by referring provider note.  Since January, he started experiencing "dizziness" and balance issues which has gradually progressed.  He states he is stumbling.  He feels off balance.  He reports lack of coordination.  When he walks, his feet "don't go where my brain tells them".  He feels numbness and tingling in his feet and has trouble feeling the ground when walking.  He has had several falls.  He also reports low back pain as well as pain in the legs and feet.  He also reports numbness in the hands and arms as well.  The pain is stabbing and burning.  Arms and legs feel weak  He also reports dizziness.  When he stands up, he feels dizzy described as the room moving.  It lasts about a few minutes.  He also reports fairly constant horizontal double vision.  It resolves when covering either eye.  Severity  fluctuates, worse later in the day.  Sometimes he slurs his speech.  Sometimes trouble swallowing but nothing significant.  Hearing is fine.  No tinnitus.  No significant facial numbness.   He was started on gabapentin for the pain.  He has been on Cymbalta off and on for a couple of years for depression and arthritic pain.  He has neck pain. No changes in bowel or bladder function.  No known diabetes.  He has not seen the eye doctor for the double vision.    He had a CT of the head without contrast on 06/04/19 which was reported as negative.  05/29/19 LABS:  CMP with Na 138, K 4.4, Cl 92, CO2 28, Ca 9.4, glucose 118, BUN 21, Cr 1.07, t bili 0.5, ALP 61, AST 20, ALT 13  Past Medical History: Past Medical History:  Diagnosis Date  . Atrial fibrillation (HCC)     Medications: Outpatient Encounter Medications as of 06/24/2019  Medication Sig  . apixaban (ELIQUIS) 5 MG TABS tablet Take 5 mg by mouth daily.  . cyclobenzaprine (FLEXERIL) 10 MG tablet Take 20 mg by mouth at bedtime.  . digoxin (LANOXIN) 0.25 MG tablet Take by mouth.  . potassium chloride SA (K-DUR) 20 MEQ tablet Take 2 tablets by mouth daily.  . SUMAtriptan (IMITREX) 100 MG tablet Take 100 mg by mouth as directed.  . Cholecalciferol (VITAMIN D3) 25 MCG (1000 UT) CAPS Take 1 capsule by mouth daily.  . Coenzyme Q10 10 MG capsule Take 1 capsule by mouth daily.  . diphenhydramine-acetaminophen (  TYLENOL PM) 25-500 MG TABS tablet Take 2 tablets by mouth at bedtime as needed.  . DULoxetine (CYMBALTA) 60 MG capsule Take 60 mg by mouth 2 (two) times daily.  . furosemide (LASIX) 80 MG tablet Take 80 mg by mouth 2 (two) times daily.  Marland Kitchen gabapentin (NEURONTIN) 600 MG tablet Take 600 mg by mouth 2 (two) times daily.  Marland Kitchen lisinopril (ZESTRIL) 5 MG tablet Take 2.5 mg by mouth daily.  Marland Kitchen LORazepam (ATIVAN) 0.5 MG tablet Take 0.5 mg by mouth 3 (three) times daily.  . Melatonin (MELATONIN MAXIMUM STRENGTH) 5 MG TABS Take 2-3 tablets by mouth at bedtime as  needed.  . metoprolol (TOPROL-XL) 200 MG 24 hr tablet Take 200 mg by mouth 2 (two) times daily.  . Multiple Vitamins-Minerals (YOUR LIFE MULTI ADULT GUMMIES) CHEW Chew by mouth.  . Oxycodone HCl 20 MG TABS Take 3 tablets by mouth daily.  . pantoprazole (PROTONIX) 40 MG tablet Take 1 tablet by mouth 2 (two) times daily.  . predniSONE (DELTASONE) 20 MG tablet Take 20 mg by mouth daily.  . promethazine (PHENERGAN) 25 MG tablet Take 25 mg by mouth every 4 (four) hours as needed.  . tamsulosin (FLOMAX) 0.4 MG CAPS capsule Take 0.4 mg by mouth daily.  Marland Kitchen testosterone cypionate (DEPOTESTOSTERONE CYPIONATE) 200 MG/ML injection INJECT ONE ML INTRAMUSCULARLY TWICE A WEEK FOR LOW TESTOSTERONE.   No facility-administered encounter medications on file as of 06/24/2019.     Allergies: Allergies  Allergen Reactions  . Albuterol Swelling  . Penicillins Hives    Family History: Family History  Problem Relation Age of Onset  . Bone cancer Mother   . Other Father        MVA    Social History: Social History   Socioeconomic History  . Marital status: Married    Spouse name: Not on file  . Number of children: Not on file  . Years of education: Not on file  . Highest education level: Not on file  Occupational History  . Not on file  Social Needs  . Financial resource strain: Not on file  . Food insecurity    Worry: Not on file    Inability: Not on file  . Transportation needs    Medical: Not on file    Non-medical: Not on file  Tobacco Use  . Smoking status: Not on file  Substance and Sexual Activity  . Alcohol use: Not on file  . Drug use: Not on file  . Sexual activity: Not on file  Lifestyle  . Physical activity    Days per week: Not on file    Minutes per session: Not on file  . Stress: Not on file  Relationships  . Social Musician on phone: Not on file    Gets together: Not on file    Attends religious service: Not on file    Active member of club or  organization: Not on file    Attends meetings of clubs or organizations: Not on file    Relationship status: Not on file  . Intimate partner violence    Fear of current or ex partner: Not on file    Emotionally abused: Not on file    Physically abused: Not on file    Forced sexual activity: Not on file  Other Topics Concern  . Not on file  Social History Narrative  . Not on file   Observations/Objective:   Height 5\' 9"  (1.753 m), weight Marland Kitchen)  370 lb (167.8 kg). No acute distress.  Alert and oriented.  Speech fluent and not dysarthric.  Language intact.  Dysconjugate gaze (right eye appears abducted on primary gaze).  Face symmetric.  Assessment and Plan:   1.  Positional vertigo 2.  Diplopia/ophthalmoplegia.  I do appreciate abduction of this right eye on primary gaze. 3.  Numbness and tingling/neuropathy/unsteady gait.   4.  Atrial fibrillation 5.  Tobacco use 6.  Morbid obesity  He describes symptoms of a peripheral neuropathy.  Dizziness may be BPPV.  However, given the ophthalmoplegia, I think we need to check MRI of brain.    1.  MRI of brain with and without contrast 2.  NCV-EMG to assess for peripheral neuropathy 3.  Blood work:  B12, TSH, Hgb A1c  ADDENDUM:  Labs from 06/25/19 showed B12 592, TSH 1.760, Hgb A1c 6.1 4.  Refer to ophthalmology for formal evaluation.  ADDENDUM (07/24/19):  Patient evaluated by ophthalmologist Dr. Baker Pierini on 07/16/19.  Decompensated exodeviation suspected.  He is being referred to Dr. Rodman Pickle.   5.  Follow up in office after testing.  Further recommendations pending results.  Follow Up Instructions:    -I discussed the assessment and treatment plan with the patient. The patient was provided an opportunity to ask questions and all were answered. The patient agreed with the plan and demonstrated an understanding of the instructions.   The patient was advised to call back or seek an in-person evaluation if the symptoms worsen or if the  condition fails to improve as anticipated.   Cira Servant, DO

## 2019-06-24 ENCOUNTER — Encounter: Payer: Self-pay | Admitting: Neurology

## 2019-06-24 ENCOUNTER — Telehealth (INDEPENDENT_AMBULATORY_CARE_PROVIDER_SITE_OTHER): Payer: Commercial Managed Care - PPO | Admitting: Neurology

## 2019-06-24 ENCOUNTER — Other Ambulatory Visit: Payer: Self-pay

## 2019-06-24 VITALS — Ht 69.0 in | Wt 370.0 lb

## 2019-06-24 DIAGNOSIS — H499 Unspecified paralytic strabismus: Secondary | ICD-10-CM

## 2019-06-24 DIAGNOSIS — R6889 Other general symptoms and signs: Secondary | ICD-10-CM

## 2019-06-24 DIAGNOSIS — Z720 Tobacco use: Secondary | ICD-10-CM

## 2019-06-24 DIAGNOSIS — H532 Diplopia: Secondary | ICD-10-CM

## 2019-06-24 DIAGNOSIS — G629 Polyneuropathy, unspecified: Secondary | ICD-10-CM

## 2019-06-24 DIAGNOSIS — R739 Hyperglycemia, unspecified: Secondary | ICD-10-CM | POA: Diagnosis not present

## 2019-06-24 DIAGNOSIS — R296 Repeated falls: Secondary | ICD-10-CM | POA: Diagnosis not present

## 2019-06-24 DIAGNOSIS — I4891 Unspecified atrial fibrillation: Secondary | ICD-10-CM

## 2019-06-24 NOTE — Progress Notes (Signed)
Faxed referral to Dr. Trilby Leaver office

## 2019-07-01 DIAGNOSIS — E669 Obesity, unspecified: Secondary | ICD-10-CM | POA: Diagnosis not present

## 2019-07-01 DIAGNOSIS — I5042 Chronic combined systolic (congestive) and diastolic (congestive) heart failure: Secondary | ICD-10-CM | POA: Diagnosis not present

## 2019-07-01 DIAGNOSIS — Z720 Tobacco use: Secondary | ICD-10-CM | POA: Diagnosis not present

## 2019-07-01 DIAGNOSIS — Z6841 Body Mass Index (BMI) 40.0 and over, adult: Secondary | ICD-10-CM | POA: Diagnosis not present

## 2019-07-02 ENCOUNTER — Other Ambulatory Visit: Payer: Self-pay | Admitting: Family Medicine

## 2019-07-02 ENCOUNTER — Ambulatory Visit
Admission: RE | Admit: 2019-07-02 | Discharge: 2019-07-02 | Disposition: A | Discharge status: Home / Self Care | Payer: Medicare Other | Source: Ambulatory Visit | Attending: Family Medicine | Admitting: Family Medicine

## 2019-07-02 ENCOUNTER — Other Ambulatory Visit: Payer: Self-pay

## 2019-07-02 DIAGNOSIS — G35 Multiple sclerosis: Secondary | ICD-10-CM

## 2019-07-07 ENCOUNTER — Ambulatory Visit (INDEPENDENT_AMBULATORY_CARE_PROVIDER_SITE_OTHER): Payer: Commercial Managed Care - PPO | Admitting: Neurology

## 2019-07-07 ENCOUNTER — Other Ambulatory Visit: Payer: Self-pay

## 2019-07-07 DIAGNOSIS — G629 Polyneuropathy, unspecified: Secondary | ICD-10-CM | POA: Diagnosis not present

## 2019-07-07 NOTE — Procedures (Signed)
[facility]  [address] [place], [address]  [place], [place] [address] Tel: [phone] Fax:  [phone] Test Date:  07/07/2019  Patient: Timothy Macias DOB: [birth year] Physician: Nita Sickle, DO  Sex: Male Height: 5\' 9"  Ref Phys: Nita Sickle, DO  ID#: [id number] Temp: 34.0C Technician:    Patient Complaints: This is a 58 year old man referred for evaluation of gait imbalance and left foot weakness.  NCV & EMG Findings: Extensive electrodiagnostic testing of the left lower extremity and additional studies of the right shows:  1. Bilateral sural and superficial peroneal sensory responses are absent. 2. Bilateral peroneal (EBD) and tibial motor responses are absent.  Bilateral peroneal motor responses at the tibialis anterior shows markedly reduced amplitude (L1.1, R1.5 mV).   3. Bilateral tibial H reflex studies are absent. 4. Severe active on chronic motor axonal loss changes are seen affecting the muscles below the knee with chronic changes seen in bilateral rectus femoris muscles.  Proximal and deep muscles are not tested as the patient is on anticoagulation therapy.   Impression: The electrophysiologic findings are consistent with a severe active on chronic sensorimotor axonal polyneuropathy affecting the lower extremities, worse on the left.   A superimposed multilevel intraspinal canal lesion (i.e. radiculopathy) cannot be excluded.   ___________________________ Nita Sickle, DO    Nerve Conduction Studies Anti Sensory Summary Table   Site NR Peak (ms) Norm Peak (ms) P-T Amp (V) Norm P-T Amp  Left Sup Peroneal Anti Sensory (Ant Lat Mall)  34C  12 cm NR  <4.6  >4  Right Sup Peroneal Anti Sensory (Ant Lat Mall)  34C  12 cm NR  <4.6  >4  Left Sural Anti Sensory (Lat Mall)  34C  Calf NR  <4.6  >4  Right Sural Anti Sensory (Lat Mall)  34C  Calf NR  <4.6  >4   Motor Summary Table   Site NR Onset (ms) Norm Onset (ms) O-P Amp (mV) Norm O-P Amp Site1  Site2 Delta-0 (ms) Dist (cm) Vel (m/s) Norm Vel (m/s)  Left Peroneal Motor (Ext Dig Brev)  34C  Ankle NR  <6.0  >2.5 B Fib Ankle  0.0  >40  B Fib NR     Poplt B Fib  0.0  >40  Poplt NR            Right Peroneal Motor (Ext Dig Brev)  34C  Ankle NR  <6.0  >2.5 B Fib Ankle  0.0  >40  B Fib NR     Poplt B Fib  0.0  >40  Poplt NR            Left Peroneal TA Motor (Tib Ant)  34C  Fib Head    4.4 <4.5 1.1 >3 Poplit Fib Head 1.3 7.0 54 >40  Poplit    5.7  1.0         Post-exercise    4.6  1.0         Right Peroneal TA Motor (Tib Ant)  34C  Fib Head    3.0 <4.5 1.5 >3 Poplit Fib Head 1.4 8.0 57 >40  Poplit    4.4  1.5         Post-exercise    3.0  1.5         Left Tibial Motor (Abd Hall Brev)  34C  Ankle NR  <6.0  >4 Knee Ankle  0.0  >40  Knee NR  Right Tibial Motor (Abd Hall Brev)  34C  Ankle NR  <6.0  >4 Knee Ankle  0.0  >40  Knee NR             H Reflex Studies   NR H-Lat (ms) Lat Norm (ms) L-R H-Lat (ms)  Left Tibial (Gastroc)  34C  NR  <35   Right Tibial (Gastroc)  34C  NR  <35    EMG   Side Muscle Ins Act Fibs Psw Fasc Number Recrt Dur Dur. Amp Amp. Poly Poly. Comment  Left AntTibialis Nml 2+ Nml Nml NE None - - - - - - N/A  Left Gastroc Nml 1+ Nml Nml 2- Rapid All 1+ All 1+ All 1+ N/A  Left RectFemoris Nml Nml Nml Nml 2- Rapid Most 1+ Most 1+ Most 1+ N/A  Right AntTibialis Nml 2+ Nml Nml 2- Rapid All 1+ All 1+ All 1+ N/A  Right Gastroc Nml 1+ Nml Nml 2- Rapid Many 1+ Many 1+ Many 1+ N/A  Right RectFemoris Nml Nml Nml Nml 1- Rapid Some 1+ Some 1+ Some 1+ N/A      Waveforms:

## 2019-07-08 ENCOUNTER — Telehealth: Payer: Self-pay

## 2019-07-08 DIAGNOSIS — R6889 Other general symptoms and signs: Secondary | ICD-10-CM

## 2019-07-08 DIAGNOSIS — G5793 Unspecified mononeuropathy of bilateral lower limbs: Secondary | ICD-10-CM

## 2019-07-08 DIAGNOSIS — G629 Polyneuropathy, unspecified: Secondary | ICD-10-CM

## 2019-07-08 DIAGNOSIS — M5417 Radiculopathy, lumbosacral region: Secondary | ICD-10-CM

## 2019-07-08 NOTE — Telephone Encounter (Signed)
-----   Message from Pieter Partridge, DO sent at 07/07/2019 11:22 AM EDT ----- Nerve study shows severe peripheral neuropathy.  Pinched nerves in the lower back also a possibility.  He should be having labs performed (Hgb A1c, B12, TSH).  I would like to add ANA, Sed Rate, RF, B6, SPEP/IFE.  I would also like to order MRI of lumbar spine without contrast for evaluation of multilevel lumbosacral radiculopathy.

## 2019-07-08 NOTE — Telephone Encounter (Signed)
Called and advised Pt of EMG results, MRI recommendation, additional labs. Pt verbalized understanding.

## 2019-07-10 ENCOUNTER — Telehealth: Payer: Self-pay

## 2019-07-10 NOTE — Telephone Encounter (Signed)
Per Dr. Tomi Likens, reviewed his brain MRI and it looks overall unremarkable.   Called Pt and advised him.

## 2019-07-13 IMAGING — MR MRI HEAD WITHOUT CONTRAST
12 series · 43 of 48 positions shown · non-contrast
Comparison: Head CT 06/04/2019

CLINICAL DATA: Worsening fatigue and left leg weakness. Double
vision and gait disturbance. Question multiple sclerosis.

EXAM:
MRI HEAD WITHOUT CONTRAST
TECHNIQUE: Multiplanar, multiecho pulse sequences of the brain and surrounding
structures were obtained without intravenous contrast.

[Series 5: T1 · sagittal · 4.0mm · 0.75mm/px · 3 of 29 slices shown (1 of 2)]
[im 1/29]
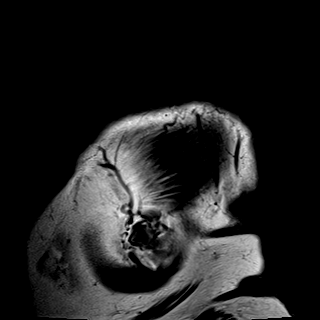
[im 15/29]
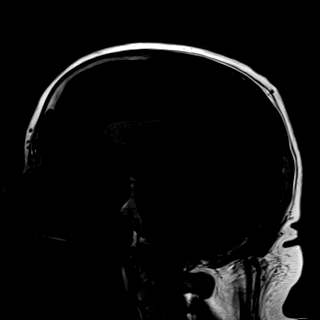
[im 29/29]
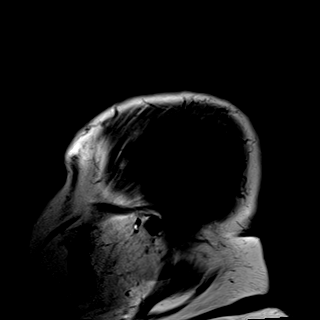

[Series 6: T2 · axial · 4.0mm · 0.36mm/px · z∈[-14,+117]mm · 3 of 28 slices shown (1 of 2)]
[im 1/28]
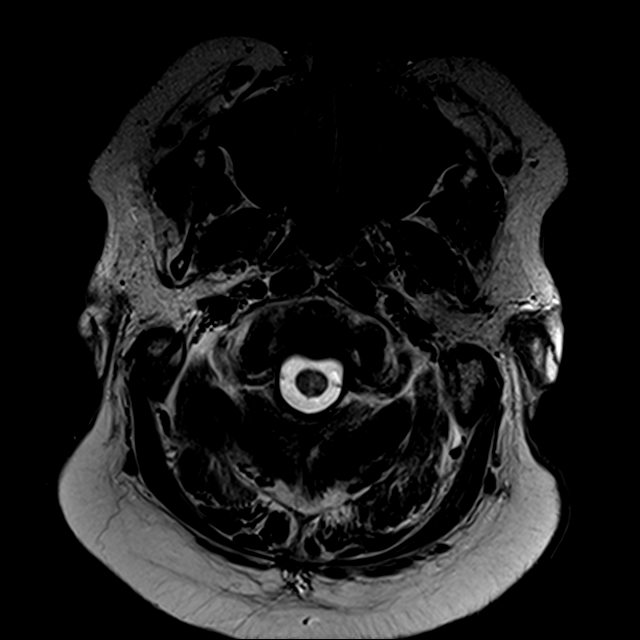
[im 14/28]
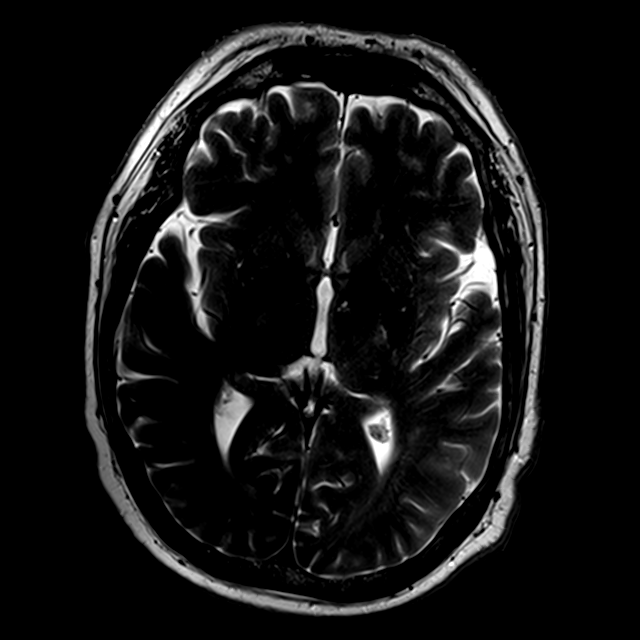
[im 28/28]
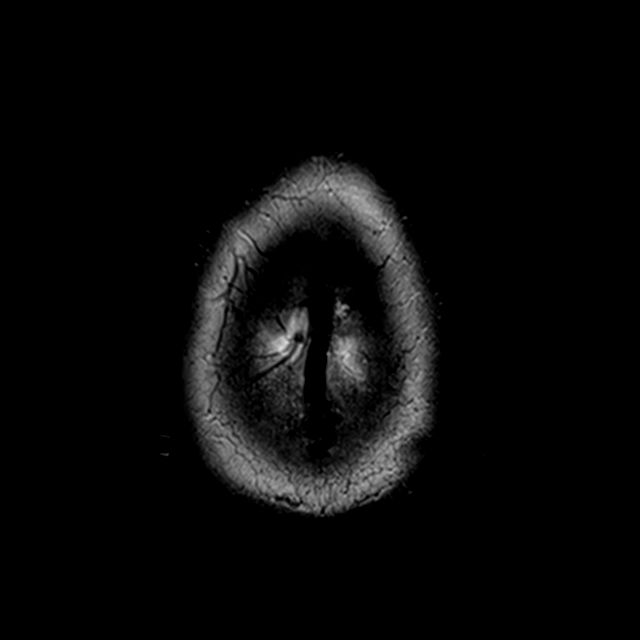

[Series 7: DWI · axial · 3.0mm · 1.44mm/px · z∈[-2,+121]mm · 7 of 84 slices shown (1 of 4)]
[im 1/84]
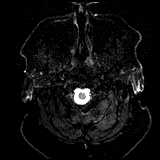
[im 14/84]
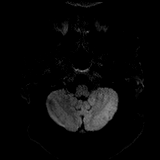
[im 28/84]
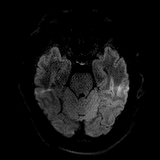
[im 42/84]
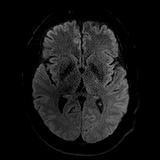
[im 56/84]
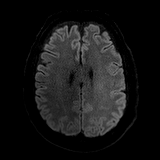
[im 70/84]
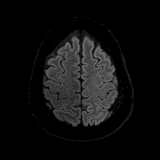
[im 84/84]
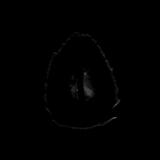

[Series 8: DWI · axial · 3.0mm · 1.44mm/px · z∈[-2,+121]mm · 3 of 42 slices shown (2 of 4)]
[im 1/42]
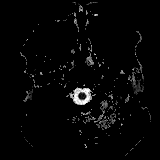
[im 21/42]
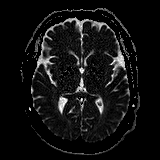
[im 42/42]
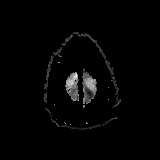

[Series 9: DWI · coronal · 5.0mm · 1.44mm/px · 5 of 60 slices shown (3 of 4)]
[im 1/60]
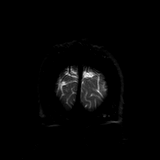
[im 15/60]
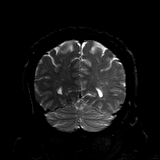
[im 30/60]
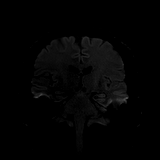
[im 45/60]
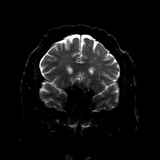
[im 60/60]
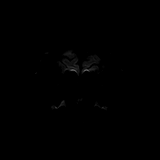

[Series 10: DWI · coronal · 5.0mm · 1.44mm/px · 2 of 30 slices shown (4 of 4)]
[im 1/30]
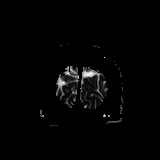
[im 30/30]
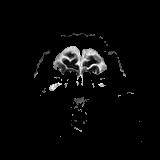

[Series 11: T2 · axial · 4.0mm · 0.36mm/px · z∈[-1,+128]mm · 2 of 28 slices shown (2 of 2)]
[im 1/28]
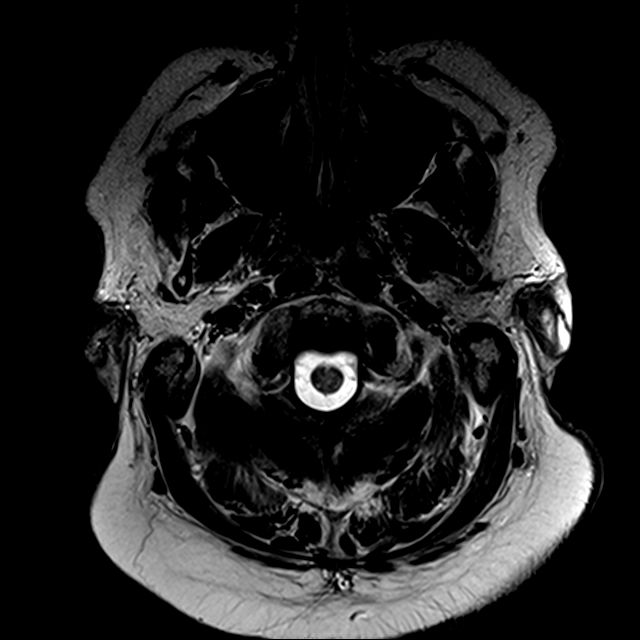
[im 28/28]
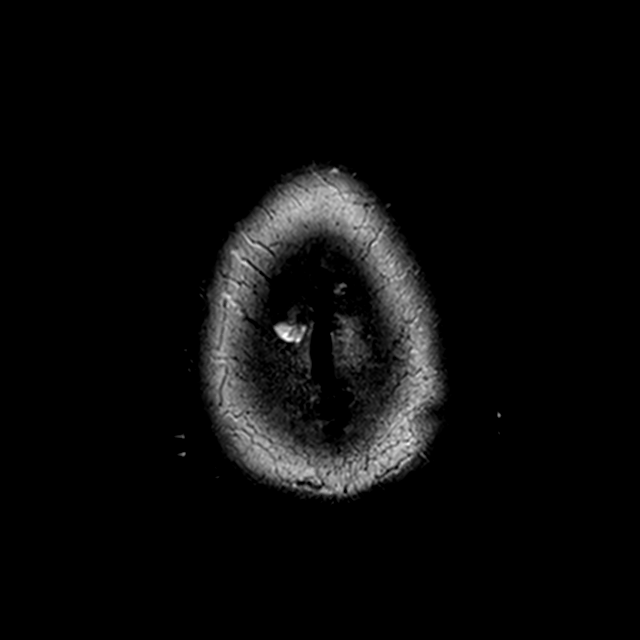

[Series 12: FLAIR · sagittal · 4.0mm · 0.72mm/px · 2 of 29 slices shown (1 of 2)]
[im 1/29]
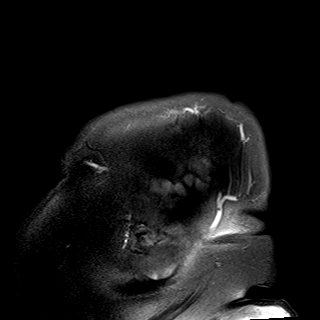
[im 29/29]
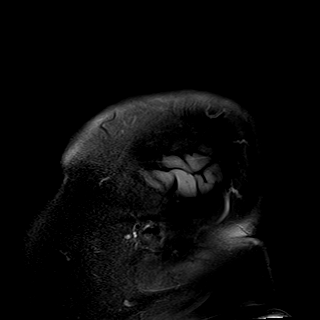

[Series 14: swi_images · axial · 4.0mm · 0.90mm/px · z∈[-2,+60]mm · 2 of 36 slices shown]
[im 1/36]
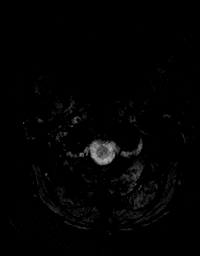
[im 18/36]
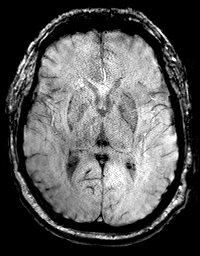

[Series 15: FLAIR · axial · 3.0mm · 0.72mm/px · z∈[-3,+130]mm · 3 of 40 slices shown (2 of 2)]
[im 1/40]
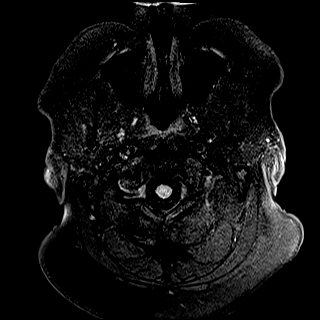
[im 20/40]
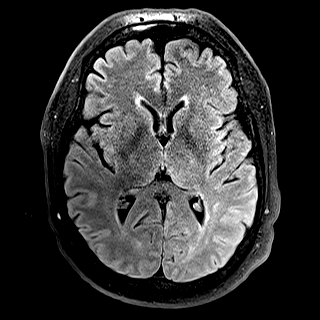
[im 40/40]
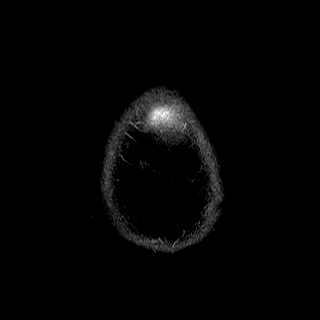

[Series 16: T1 · axial · 1.0mm · 0.90mm/px · z∈[-14,+125]mm · 8 of 144 slices shown (2 of 2)]
[im 1/144]
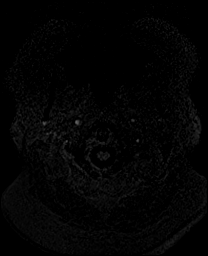
[im 27/144]
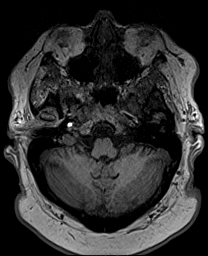
[im 40/144]
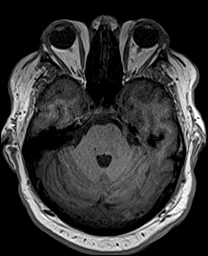
[im 66/144]
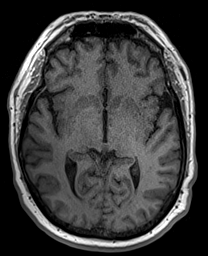
[im 79/144]
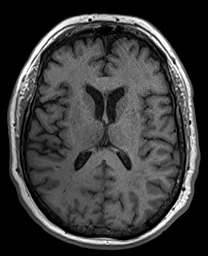
[im 105/144]
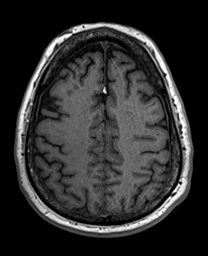
[im 118/144]
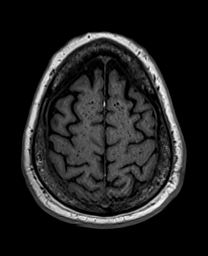
[im 144/144]
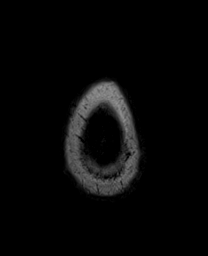

[Series 17: T2 post-contrast · coronal · 4.0mm · 0.36mm/px · 3 of 33 slices shown]
[im 1/33]
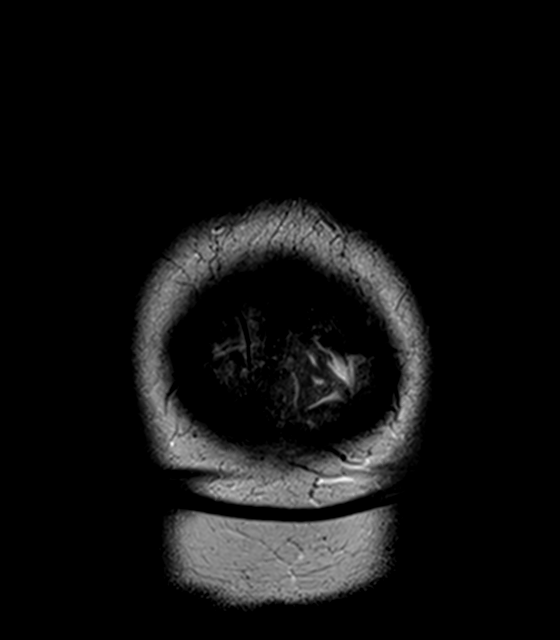
[im 17/33]
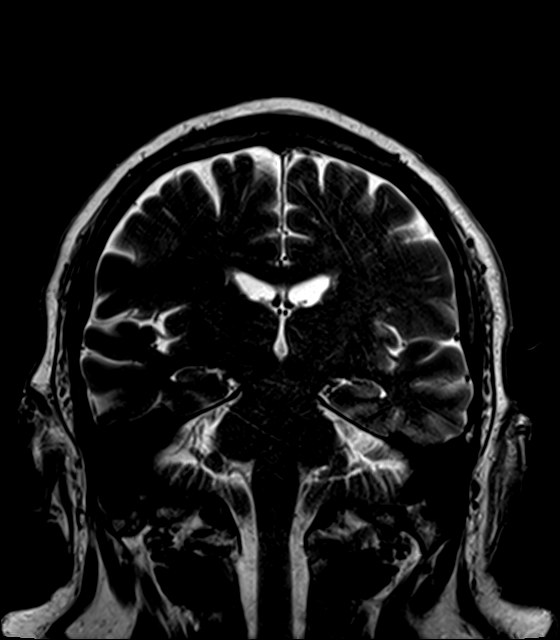
[im 33/33]
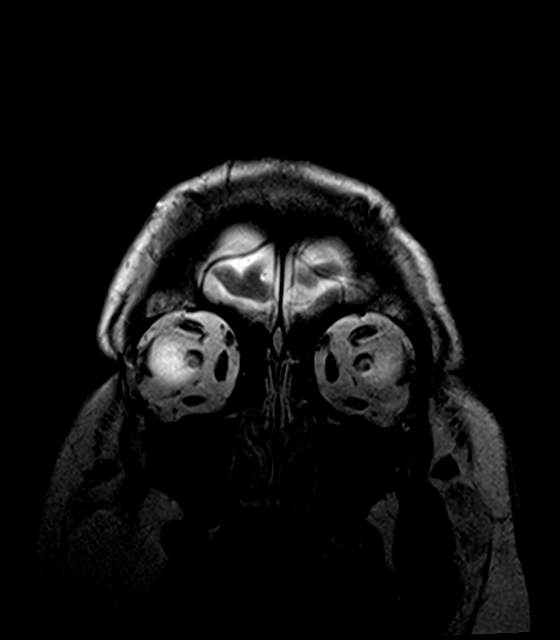

[43 of 48 positions shown; findings below may reference images not displayed]

FINDINGS: Brain: Diffusion imaging does not show any acute or subacute
infarction. No abnormality seen affecting the brainstem or
cerebellum. Within the cerebral hemispheres, there are multiple
scattered punctate foci of T2 and FLAIR signal within the
subcortical more than deep white matter. Old small vessel lacunar
infarctions in the thalami. Findings are most consistent with a
manifestation of small vessel disease rather than demyelinating
disease, though the latter is not absolutely excluded. No mass
lesion, hemorrhage, hydrocephalus or extra-axial collection.

Vascular: Major vessels at the base of the brain show flow.

Skull and upper cervical spine: Negative

Sinuses/Orbits: Clear/normal

Other: None
IMPRESSION: No acute or reversible finding. Multiple punctate foci of T2 and
FLAIR signal within the cerebral hemispheric white matter,
subcortical more than deep. The pattern is more consistent with
small vessel disease than demyelinating disease, though the latter
is not absolutely excluded.

## 2019-07-29 ENCOUNTER — Ambulatory Visit
Admission: RE | Admit: 2019-07-29 | Discharge: 2019-07-29 | Disposition: A | Discharge status: Home / Self Care | Payer: Medicare Other | Source: Ambulatory Visit | Attending: Neurology | Admitting: Neurology

## 2019-07-29 ENCOUNTER — Other Ambulatory Visit (INDEPENDENT_AMBULATORY_CARE_PROVIDER_SITE_OTHER): Payer: Commercial Managed Care - PPO

## 2019-07-29 ENCOUNTER — Other Ambulatory Visit: Payer: Self-pay

## 2019-07-29 DIAGNOSIS — R739 Hyperglycemia, unspecified: Secondary | ICD-10-CM

## 2019-07-29 DIAGNOSIS — R6889 Other general symptoms and signs: Secondary | ICD-10-CM

## 2019-07-29 DIAGNOSIS — G5793 Unspecified mononeuropathy of bilateral lower limbs: Secondary | ICD-10-CM

## 2019-07-29 DIAGNOSIS — G629 Polyneuropathy, unspecified: Secondary | ICD-10-CM

## 2019-07-29 DIAGNOSIS — M5417 Radiculopathy, lumbosacral region: Secondary | ICD-10-CM

## 2019-07-29 LAB — TSH: TSH: 3.07 u[IU]/mL (ref 0.35–4.50)

## 2019-07-29 LAB — SEDIMENTATION RATE: Sed Rate: 34 mm/hr — ABNORMAL HIGH (ref 0–20)

## 2019-07-29 LAB — VITAMIN B12: Vitamin B-12: 438 pg/mL (ref 211–911)

## 2019-07-29 LAB — HEMOGLOBIN A1C: Hgb A1c MFr Bld: 6 % (ref 4.6–6.5)

## 2019-07-29 NOTE — Addendum Note (Signed)
Addended by: Kaylyn Lim I on: 07/29/2019 08:20 AM   Modules accepted: Orders

## 2019-07-29 NOTE — Addendum Note (Signed)
Addended by: STONE-ELMORE, Rabecca Birge I on: 07/29/2019 08:20 AM   Modules accepted: Orders  

## 2019-07-29 NOTE — Addendum Note (Signed)
Addended by: STONE-ELMORE, Aviyah Swetz I on: 07/29/2019 08:20 AM   Modules accepted: Orders  

## 2019-07-31 LAB — IMMUNOFIXATION ELECTROPHORESIS
IgG (Immunoglobin G), Serum: 930 mg/dL (ref 600–1640)
IgM, Serum: 78 mg/dL (ref 50–300)
Immunofix Electr Int: NOT DETECTED
Immunoglobulin A: 232 mg/dL (ref 47–310)

## 2019-07-31 LAB — ANA: Anti Nuclear Antibody (ANA): NEGATIVE

## 2019-07-31 LAB — RHEUMATOID FACTOR: Rheumatoid fact SerPl-aCnc: <14 IU/mL (ref <14)

## 2019-08-01 LAB — PROTEIN ELECTROPHORESIS, SERUM
Albumin ELP: 3.1 g/dL — ABNORMAL LOW (ref 3.8–4.8)
Alpha 1: 0.4 g/dL — ABNORMAL HIGH (ref 0.2–0.3)
Alpha 2: 0.9 g/dL (ref 0.5–0.9)
Beta 2: 0.3 g/dL (ref 0.2–0.5)
Beta Globulin: 0.5 g/dL (ref 0.4–0.6)
Gamma Globulin: 0.9 g/dL (ref 0.8–1.7)
Total Protein: 6 g/dL — ABNORMAL LOW (ref 6.1–8.1)

## 2019-08-01 LAB — VITAMIN B6: Vitamin B6: 6.5 ng/mL (ref 2.1–21.7)

## 2019-08-05 ENCOUNTER — Other Ambulatory Visit: Payer: Self-pay

## 2019-08-05 ENCOUNTER — Telehealth: Payer: Self-pay

## 2019-08-05 DIAGNOSIS — M5417 Radiculopathy, lumbosacral region: Secondary | ICD-10-CM

## 2019-08-05 NOTE — Telephone Encounter (Signed)
-----   Message from Pieter Partridge, DO sent at 08/04/2019 12:23 PM EDT ----- The labs checking for causes of neuropathy are back.  Overall unremarkable.  One test is mildly elevated but is nonspecific and likely of no clinical significance.

## 2019-08-05 NOTE — Telephone Encounter (Signed)
-----   Message from Pieter Partridge, DO sent at 07/30/2019  4:22 PM EDT ----- MRI of lumbar spine shows several levels of arthritic changes in the spine that are pressing on nerves.  This may be contributing to the back and leg pain.  We can refer to spine surgeon for evaluation.  If not surgery, they may recommend injections.

## 2019-08-05 NOTE — Telephone Encounter (Signed)
Called and spoke with Pt, advised of MRI results, and referral to [facility]

## 2019-08-05 NOTE — Telephone Encounter (Signed)
Called and advised Pt of lab results 

## 2019-08-17 DIAGNOSIS — M48061 Spinal stenosis, lumbar region without neurogenic claudication: Secondary | ICD-10-CM | POA: Diagnosis not present

## 2019-08-19 DIAGNOSIS — I4811 Longstanding persistent atrial fibrillation: Secondary | ICD-10-CM | POA: Insufficient documentation

## 2019-08-24 NOTE — Progress Notes (Signed)
Virtual Visit via Video Note The purpose of this virtual visit is to provide medical care while limiting exposure to the novel coronavirus.    Consent was obtained for video visit:  Yes Answered questions that patient had about telehealth interaction:  Yes I discussed the limitations, risks, security and privacy concerns of performing an evaluation and management service by telemedicine. I also discussed with the patient that there may be a patient responsible charge related to this service. The patient expressed understanding and agreed to proceed.  Pt location: Home Physician Location: Home Name of referring provider:  Richardean Chimeraaniel, Terry G, MD I connected with Timothy LitesAnthony Macias at patients initiation/request on 08/26/2019 at  8:50 AM EDT by video enabled telemedicine application and verified that I am speaking with the correct person using two identifiers. Pt MRN:  [id number] Pt DOB:  03/23/1961 Video Participants:  Timothy Macias   History of Present Illness:  Timothy Litesnthony Macias is a 58 year old man with atrial fibrillation, on anticoagulation, who follows up for peripheral neuropathy, BPPV  UPDATE: 06/25/19 LABS:  B12 592, TSH 1.760, Hgb A1c 6.1. 07/02/19 MRI Brain wo:  Multiple punctate T2 and FLAIR signal within the cerebral hemispheric and subcortical white matter, likely small vessel disease.  My suspicion for demyelinating disease is low. 07/07/19:  NCV-EMG demonstrated severe active on chronic sensorimotor axonal polyneuropathy, worse on left.  To rule out possible superimposed multilevel radiculopathy, MRI lumbar spine performed on 07/29/19, which showed compressive spinal stenosis from T12-L1 to L3-4 with moderate spinal stenosis at L4-5.  He was evaluated by neurosurgery who felt symptoms more likely related to polyneuropathy and that given severity of polyneuropathy, surgery would not be beneficial anyway.   07/24/19 Ophthalmology (Dr. Alben SpittleWeaver):  Exam demonstrated decompensated  exodeviation.  He was being referred to Dr. Rodman PickleGrace Patel, scheduled for 9/29.  HISTORY: Since January, he started experiencing "dizziness" and balance issues which has gradually progressed.  He states he is stumbling.  He feels off balance.  He reports lack of coordination.  When he walks, his feet "don't go where my brain tells them".  He feels numbness and tingling in his feet and has trouble feeling the ground when walking.  He has had several falls.  He also reports low back pain as well as pain in the legs and feet.  He also reports numbness in the hands and arms as well.  The pain is stabbing and burning.  Arms and legs feel weak  He also reports dizziness.  When he stands up, he feels dizzy described as the room moving.  It lasts about a few minutes.  He also reports fairly constant horizontal double vision.  It resolves when covering either eye.  Severity fluctuates, worse later in the day.  Sometimes he slurs his speech.  Sometimes trouble swallowing but nothing significant.  Hearing is fine.  No tinnitus.  No significant facial numbness.   He was started on gabapentin for the pain.  He has been on Cymbalta off and on for a couple of years for depression and arthritic pain.  He has neck pain. No changes in bowel or bladder function.  No known diabetes.  He has not seen the eye doctor for the double vision.    He had a CT of the head without contrast on 06/04/19 which was reported as negative.   Past Medical History: Past Medical History:  Diagnosis Date  . Atrial fibrillation [facility]HCC)     Medications: Outpatient Encounter Medications as of 08/26/2019  Medication Sig  . apixaban (ELIQUIS) 5 MG TABS tablet Take 5 mg by mouth daily.  . Cholecalciferol (VITAMIN D3) 25 MCG (1000 UT) CAPS Take 1 capsule by mouth daily.  . Coenzyme Q10 10 MG capsule Take 1 capsule by mouth daily.  . cyclobenzaprine (FLEXERIL) 10 MG tablet Take 20 mg by mouth at bedtime.  . digoxin (LANOXIN) 0.25 MG tablet Take by  mouth.  . diphenhydramine-acetaminophen (TYLENOL PM) 25-500 MG TABS tablet Take 2 tablets by mouth at bedtime as needed.  . DULoxetine (CYMBALTA) 60 MG capsule Take 60 mg by mouth 2 (two) times daily.  . furosemide (LASIX) 80 MG tablet Take 80 mg by mouth 2 (two) times daily.  Marland Kitchen gabapentin (NEURONTIN) 600 MG tablet Take 600 mg by mouth 2 (two) times daily.  Marland Kitchen lisinopril (ZESTRIL) 5 MG tablet Take 2.5 mg by mouth daily.  Marland Kitchen LORazepam (ATIVAN) 0.5 MG tablet Take 0.5 mg by mouth 3 (three) times daily.  . Melatonin (MELATONIN MAXIMUM STRENGTH) 5 MG TABS Take 2-3 tablets by mouth at bedtime as needed.  . metoprolol (TOPROL-XL) 200 MG 24 hr tablet Take 200 mg by mouth 2 (two) times daily.  . Multiple Vitamins-Minerals (YOUR LIFE MULTI ADULT GUMMIES) CHEW Chew by mouth.  . Oxycodone HCl 20 MG TABS Take 3 tablets by mouth daily.  . pantoprazole (PROTONIX) 40 MG tablet Take 1 tablet by mouth 2 (two) times daily.  . potassium chloride SA (K-DUR) 20 MEQ tablet Take 2 tablets by mouth daily.  . predniSONE (DELTASONE) 20 MG tablet Take 20 mg by mouth daily.  . promethazine (PHENERGAN) 25 MG tablet Take 25 mg by mouth every 4 (four) hours as needed.  . SUMAtriptan (IMITREX) 100 MG tablet Take 100 mg by mouth as directed.  . tamsulosin (FLOMAX) 0.4 MG CAPS capsule Take 0.4 mg by mouth daily.  Marland Kitchen testosterone cypionate (DEPOTESTOSTERONE CYPIONATE) 200 MG/ML injection INJECT ONE ML INTRAMUSCULARLY TWICE A WEEK FOR LOW TESTOSTERONE.   No facility-administered encounter medications on file as of 08/26/2019.     Allergies: Allergies  Allergen Reactions  . Albuterol Swelling  . Penicillins Hives    Family History: Family History  Problem Relation Age of Onset  . Bone cancer Mother   . Other Father        MVA    Social History: Social History   Socioeconomic History  . Marital status: Married    Spouse name: [name]  . Number of children: Not on file  . Years of education: Not on file  . Highest  education level: Some college, no degree  Occupational History  . Not on file  Social Needs  . Financial resource strain: Not on file  . Food insecurity    Worry: Not on file    Inability: Not on file  . Transportation needs    Medical: Not on file    Non-medical: Not on file  Tobacco Use  . Smoking status: Current Some Day Smoker    Packs/day: 1.00    Types: Cigarettes  . Smokeless tobacco: Never Used  Substance and Sexual Activity  . Alcohol use: Not Currently  . Drug use: Never  . Sexual activity: Not on file  Lifestyle  . Physical activity    Days per week: Not on file    Minutes per session: Not on file  . Stress: Not on file  Relationships  . Social Herbalist on phone: Not on file    Gets together: Not on  file    Attends religious service: Not on file    Active member of club or organization: Not on file    Attends meetings of clubs or organizations: Not on file    Relationship status: Not on file  . Intimate partner violence    Fear of current or ex partner: Not on file    Emotionally abused: Not on file    Physically abused: Not on file    Forced sexual activity: Not on file  Other Topics Concern  . Not on file  Social History Narrative   Patient is right-handed. He lives with his wife in a one level home. He drinks 1-2 diet sodas a day. He does not exercise.    Observations/Objective:   Height 5\' 9"  (1.753 m), weight (!) 350 lb (158.8 kg). No acute distress.  Alert and oriented.  Speech fluent and not dysarthric.  Language intact.  Eyes orthophoric on primary gaze.  Face symmetric.  Assessment and Plan:   1.  Benign paroxysmal positional vertigo 2.  Polyneuropathy, idiopathic.  Possibly related to pre-diabetes. 3.  Diplopia, thought to be due to decompensated exodeviation  1. To continue investigating etiology of neuropathy, will check SPEP/IFE, ANA, Sed Rate, B6. 2.  I will also check a myasthenia panel to further evaluate diplopia 3.  I  would like to refer to physical therapy at [facility] to assist in unsteady gait and any recommendations for assisted device that may be helpful. 4.  Follow up with me in 4 months in the office.  Follow Up Instructions:    -I discussed the assessment and treatment plan with the patient. The patient was provided an opportunity to ask questions and all were answered. The patient agreed with the plan and demonstrated an understanding of the instructions.   The patient was advised to call back or seek an in-person evaluation if the symptoms worsen or if the condition fails to improve as anticipated.    Total Time spent in visit with the patient was:  15 minutes  Cira Servant, DO

## 2019-08-26 ENCOUNTER — Other Ambulatory Visit: Payer: Self-pay

## 2019-08-26 ENCOUNTER — Encounter: Payer: Self-pay | Admitting: Neurology

## 2019-08-26 ENCOUNTER — Telehealth (INDEPENDENT_AMBULATORY_CARE_PROVIDER_SITE_OTHER): Payer: Commercial Managed Care - PPO | Admitting: Neurology

## 2019-08-26 VITALS — Ht 69.0 in | Wt 350.0 lb

## 2019-08-26 DIAGNOSIS — M5417 Radiculopathy, lumbosacral region: Secondary | ICD-10-CM

## 2019-08-26 DIAGNOSIS — G629 Polyneuropathy, unspecified: Secondary | ICD-10-CM | POA: Diagnosis not present

## 2019-08-26 DIAGNOSIS — G5793 Unspecified mononeuropathy of bilateral lower limbs: Secondary | ICD-10-CM

## 2019-08-26 DIAGNOSIS — R296 Repeated falls: Secondary | ICD-10-CM

## 2019-08-26 DIAGNOSIS — H532 Diplopia: Secondary | ICD-10-CM

## 2019-08-26 DIAGNOSIS — R2681 Unsteadiness on feet: Secondary | ICD-10-CM

## 2019-08-26 NOTE — Patient Instructions (Signed)
1.  I would like to check blood work for:  SPEP/IFE, ANA, Sed Rate, Myasthenia panel 2.  I would like to refer to physical therapy at [facility] to assist in unsteady gait and any recommendations for assisted device that may be helpful. 3.  Follow up with me in 4 months in the office.

## 2019-08-27 ENCOUNTER — Telehealth: Payer: Self-pay | Admitting: *Deleted

## 2019-08-27 NOTE — Addendum Note (Signed)
Addended by: Jesse Fall on: 08/27/2019 09:43 AM   Modules accepted: Orders

## 2019-08-27 NOTE — Addendum Note (Signed)
Addended by: Jesse Fall on: 08/27/2019 10:39 AM   Modules accepted: Orders

## 2019-08-27 NOTE — Telephone Encounter (Signed)
Called patient and made him aware all his labs are entered in the computer and he can come anytime to check in at our office and then go to 2nd floor for blood work. He stated he will come in the next few days for it.  Also made patient aware PT referral made to [facility]. Fax sent to [phone] and phone is [phone]. Patient aware of PT referral also.

## 2019-09-01 ENCOUNTER — Telehealth: Payer: Self-pay | Admitting: Neurology

## 2019-09-01 NOTE — Telephone Encounter (Signed)
Timothy Macias from [facility] in [place] called and said they've received a referral on this patient but only the cover sheet came over but not the visit notes, etc. Please resend the referral.

## 2019-09-02 NOTE — Telephone Encounter (Signed)
Refax by Cristy Friedlander She states that she refax and it did go through today

## 2019-09-03 ENCOUNTER — Other Ambulatory Visit (INDEPENDENT_AMBULATORY_CARE_PROVIDER_SITE_OTHER): Payer: Commercial Managed Care - PPO

## 2019-09-03 ENCOUNTER — Other Ambulatory Visit: Payer: Self-pay

## 2019-09-03 ENCOUNTER — Other Ambulatory Visit: Payer: Self-pay | Admitting: *Deleted

## 2019-09-03 DIAGNOSIS — G5793 Unspecified mononeuropathy of bilateral lower limbs: Secondary | ICD-10-CM

## 2019-09-03 DIAGNOSIS — H499 Unspecified paralytic strabismus: Secondary | ICD-10-CM

## 2019-09-03 DIAGNOSIS — H532 Diplopia: Secondary | ICD-10-CM

## 2019-09-03 DIAGNOSIS — G629 Polyneuropathy, unspecified: Secondary | ICD-10-CM

## 2019-09-03 DIAGNOSIS — M5417 Radiculopathy, lumbosacral region: Secondary | ICD-10-CM

## 2019-09-03 DIAGNOSIS — R296 Repeated falls: Secondary | ICD-10-CM

## 2019-09-03 DIAGNOSIS — R2681 Unsteadiness on feet: Secondary | ICD-10-CM

## 2019-09-03 LAB — SEDIMENTATION RATE: Sed Rate: 33 mm/h — ABNORMAL HIGH (ref 0–20)

## 2019-09-07 LAB — IMMUNOFIXATION ELECTROPHORESIS
IgG (Immunoglobin G), Serum: 975 mg/dL (ref 600–1640)
IgM, Serum: 87 mg/dL (ref 50–300)
Immunofix Electr Int: NOT DETECTED
Immunoglobulin A: 256 mg/dL (ref 47–310)

## 2019-09-08 LAB — PROTEIN ELECTROPHORESIS, SERUM
Albumin ELP: 3.1 g/dL — ABNORMAL LOW (ref 3.8–4.8)
Alpha 1: 0.4 g/dL — ABNORMAL HIGH (ref 0.2–0.3)
Alpha 2: 0.8 g/dL (ref 0.5–0.9)
Beta 2: 0.3 g/dL (ref 0.2–0.5)
Beta Globulin: 0.5 g/dL (ref 0.4–0.6)
Gamma Globulin: 0.9 g/dL (ref 0.8–1.7)
Total Protein: 6.1 g/dL (ref 6.1–8.1)

## 2019-09-08 LAB — VITAMIN B6: Vitamin B6: 4.2 ng/mL (ref 2.1–21.7)

## 2019-09-08 LAB — STRIATED MUSCLE ANTIBODY: STRIATED MUSCLE AB SCREEN: NEGATIVE

## 2019-09-08 LAB — ANA: Anti Nuclear Antibody (ANA): NEGATIVE

## 2019-09-08 LAB — ACETYLCHOLINE RECEPTOR, BINDING: A CHR BINDING ABS: <0.3 nmol/L

## 2019-09-11 DIAGNOSIS — R531 Weakness: Secondary | ICD-10-CM | POA: Diagnosis not present

## 2019-09-11 DIAGNOSIS — R262 Difficulty in walking, not elsewhere classified: Secondary | ICD-10-CM | POA: Diagnosis not present

## 2019-09-11 DIAGNOSIS — G629 Polyneuropathy, unspecified: Secondary | ICD-10-CM | POA: Diagnosis not present

## 2019-09-11 DIAGNOSIS — R269 Unspecified abnormalities of gait and mobility: Secondary | ICD-10-CM | POA: Diagnosis not present

## 2019-09-14 ENCOUNTER — Encounter: Payer: Self-pay | Admitting: Neurology

## 2019-09-14 DIAGNOSIS — R269 Unspecified abnormalities of gait and mobility: Secondary | ICD-10-CM | POA: Diagnosis not present

## 2019-09-14 DIAGNOSIS — R262 Difficulty in walking, not elsewhere classified: Secondary | ICD-10-CM | POA: Diagnosis not present

## 2019-09-14 DIAGNOSIS — G629 Polyneuropathy, unspecified: Secondary | ICD-10-CM | POA: Diagnosis not present

## 2019-09-14 DIAGNOSIS — R531 Weakness: Secondary | ICD-10-CM | POA: Diagnosis not present

## 2019-09-16 DIAGNOSIS — R262 Difficulty in walking, not elsewhere classified: Secondary | ICD-10-CM | POA: Diagnosis not present

## 2019-09-16 DIAGNOSIS — R269 Unspecified abnormalities of gait and mobility: Secondary | ICD-10-CM | POA: Diagnosis not present

## 2019-09-16 DIAGNOSIS — R531 Weakness: Secondary | ICD-10-CM | POA: Diagnosis not present

## 2019-09-16 DIAGNOSIS — G629 Polyneuropathy, unspecified: Secondary | ICD-10-CM | POA: Diagnosis not present

## 2019-09-21 DIAGNOSIS — R262 Difficulty in walking, not elsewhere classified: Secondary | ICD-10-CM | POA: Diagnosis not present

## 2019-09-21 DIAGNOSIS — G629 Polyneuropathy, unspecified: Secondary | ICD-10-CM | POA: Diagnosis not present

## 2019-09-21 DIAGNOSIS — R269 Unspecified abnormalities of gait and mobility: Secondary | ICD-10-CM | POA: Diagnosis not present

## 2019-09-21 DIAGNOSIS — R531 Weakness: Secondary | ICD-10-CM | POA: Diagnosis not present

## 2019-09-23 DIAGNOSIS — R531 Weakness: Secondary | ICD-10-CM | POA: Diagnosis not present

## 2019-09-23 DIAGNOSIS — R269 Unspecified abnormalities of gait and mobility: Secondary | ICD-10-CM | POA: Diagnosis not present

## 2019-09-23 DIAGNOSIS — G629 Polyneuropathy, unspecified: Secondary | ICD-10-CM | POA: Diagnosis not present

## 2019-09-23 DIAGNOSIS — R262 Difficulty in walking, not elsewhere classified: Secondary | ICD-10-CM | POA: Diagnosis not present

## 2019-09-29 DIAGNOSIS — G629 Polyneuropathy, unspecified: Secondary | ICD-10-CM | POA: Diagnosis not present

## 2019-09-29 DIAGNOSIS — R269 Unspecified abnormalities of gait and mobility: Secondary | ICD-10-CM | POA: Diagnosis not present

## 2019-09-29 DIAGNOSIS — R531 Weakness: Secondary | ICD-10-CM | POA: Diagnosis not present

## 2019-09-29 DIAGNOSIS — R262 Difficulty in walking, not elsewhere classified: Secondary | ICD-10-CM | POA: Diagnosis not present

## 2019-10-01 DIAGNOSIS — R531 Weakness: Secondary | ICD-10-CM | POA: Diagnosis not present

## 2019-10-01 DIAGNOSIS — R269 Unspecified abnormalities of gait and mobility: Secondary | ICD-10-CM | POA: Diagnosis not present

## 2019-10-01 DIAGNOSIS — R262 Difficulty in walking, not elsewhere classified: Secondary | ICD-10-CM | POA: Diagnosis not present

## 2019-10-01 DIAGNOSIS — G629 Polyneuropathy, unspecified: Secondary | ICD-10-CM | POA: Diagnosis not present

## 2019-10-02 ENCOUNTER — Telehealth: Payer: Self-pay | Admitting: *Deleted

## 2019-10-02 NOTE — Telephone Encounter (Addendum)
Call from [place]anna at [facility] that MD needs to please sign an order for patient to be fitted for bilateral AFO's to assist with gait. Order signed and faxed to [phone].  Phone is [phone] (tony)

## 2019-10-05 DIAGNOSIS — R531 Weakness: Secondary | ICD-10-CM | POA: Diagnosis not present

## 2019-10-05 DIAGNOSIS — R269 Unspecified abnormalities of gait and mobility: Secondary | ICD-10-CM | POA: Diagnosis not present

## 2019-10-05 DIAGNOSIS — G629 Polyneuropathy, unspecified: Secondary | ICD-10-CM | POA: Diagnosis not present

## 2019-10-05 DIAGNOSIS — R262 Difficulty in walking, not elsewhere classified: Secondary | ICD-10-CM | POA: Diagnosis not present

## 2019-10-07 DIAGNOSIS — R269 Unspecified abnormalities of gait and mobility: Secondary | ICD-10-CM | POA: Diagnosis not present

## 2019-10-07 DIAGNOSIS — G629 Polyneuropathy, unspecified: Secondary | ICD-10-CM | POA: Diagnosis not present

## 2019-10-07 DIAGNOSIS — R531 Weakness: Secondary | ICD-10-CM | POA: Diagnosis not present

## 2019-10-07 DIAGNOSIS — R262 Difficulty in walking, not elsewhere classified: Secondary | ICD-10-CM | POA: Diagnosis not present

## 2019-10-14 DIAGNOSIS — R262 Difficulty in walking, not elsewhere classified: Secondary | ICD-10-CM | POA: Diagnosis not present

## 2019-10-14 DIAGNOSIS — R269 Unspecified abnormalities of gait and mobility: Secondary | ICD-10-CM | POA: Diagnosis not present

## 2019-10-14 DIAGNOSIS — G629 Polyneuropathy, unspecified: Secondary | ICD-10-CM | POA: Diagnosis not present

## 2019-10-14 DIAGNOSIS — R531 Weakness: Secondary | ICD-10-CM | POA: Diagnosis not present

## 2019-10-19 DIAGNOSIS — R269 Unspecified abnormalities of gait and mobility: Secondary | ICD-10-CM | POA: Diagnosis not present

## 2019-10-19 DIAGNOSIS — R262 Difficulty in walking, not elsewhere classified: Secondary | ICD-10-CM | POA: Diagnosis not present

## 2019-10-19 DIAGNOSIS — G629 Polyneuropathy, unspecified: Secondary | ICD-10-CM | POA: Diagnosis not present

## 2019-10-19 DIAGNOSIS — R531 Weakness: Secondary | ICD-10-CM | POA: Diagnosis not present

## 2019-10-26 ENCOUNTER — Telehealth: Payer: Self-pay

## 2019-10-26 NOTE — Telephone Encounter (Addendum)
Received call from Osgood at [employer] regarding order for bilateral AFOs. Rx was sent for AFOs on 9/18/20She is requesting office notes that mention patient needs AFOs. Reviewed patient last 2 office notes from Dr. Tomi Likens neither mentions AFOs.  Per Pamala Hurry to get patient approved with insurance he will need a face to face with provider with mentions patient needs AFOs.  Pt last seen 08/26/19 for telemedicine visit.   [phone]

## 2019-10-28 NOTE — Telephone Encounter (Signed)
I would like to schedule patient for in-office visit so I can document findings and recommendation for bilateral AFOs.

## 2019-10-29 NOTE — Telephone Encounter (Signed)
Called patient no make him aware of this. No answer left message to call office back to schedule in office visit for evaluation for AFOs.

## 2019-11-24 DIAGNOSIS — H5334 Suppression of binocular vision: Secondary | ICD-10-CM | POA: Diagnosis not present

## 2019-11-24 DIAGNOSIS — H50331 Intermittent monocular exotropia, right eye: Secondary | ICD-10-CM | POA: Diagnosis not present

## 2019-11-24 DIAGNOSIS — H532 Diplopia: Secondary | ICD-10-CM | POA: Diagnosis not present

## 2019-11-24 DIAGNOSIS — Z8669 Personal history of other diseases of the nervous system and sense organs: Secondary | ICD-10-CM | POA: Diagnosis not present

## 2019-12-04 DIAGNOSIS — G8929 Other chronic pain: Secondary | ICD-10-CM | POA: Diagnosis present

## 2019-12-04 DIAGNOSIS — I5023 Acute on chronic systolic (congestive) heart failure: Secondary | ICD-10-CM | POA: Diagnosis present

## 2019-12-04 DIAGNOSIS — Z79899 Other long term (current) drug therapy: Secondary | ICD-10-CM | POA: Diagnosis not present

## 2019-12-04 DIAGNOSIS — F329 Major depressive disorder, single episode, unspecified: Secondary | ICD-10-CM | POA: Diagnosis present

## 2019-12-04 DIAGNOSIS — I4811 Longstanding persistent atrial fibrillation: Secondary | ICD-10-CM | POA: Diagnosis not present

## 2019-12-04 DIAGNOSIS — Z6841 Body Mass Index (BMI) 40.0 and over, adult: Secondary | ICD-10-CM | POA: Diagnosis not present

## 2019-12-04 DIAGNOSIS — E662 Morbid (severe) obesity with alveolar hypoventilation: Secondary | ICD-10-CM | POA: Diagnosis present

## 2019-12-04 DIAGNOSIS — I472 Ventricular tachycardia: Secondary | ICD-10-CM | POA: Diagnosis not present

## 2019-12-04 DIAGNOSIS — I471 Supraventricular tachycardia: Secondary | ICD-10-CM | POA: Diagnosis not present

## 2019-12-04 DIAGNOSIS — Z7952 Long term (current) use of systemic steroids: Secondary | ICD-10-CM | POA: Diagnosis not present

## 2019-12-04 DIAGNOSIS — Z7901 Long term (current) use of anticoagulants: Secondary | ICD-10-CM | POA: Diagnosis not present

## 2019-12-04 DIAGNOSIS — G629 Polyneuropathy, unspecified: Secondary | ICD-10-CM | POA: Diagnosis present

## 2019-12-04 DIAGNOSIS — K2100 Gastro-esophageal reflux disease with esophagitis, without bleeding: Secondary | ICD-10-CM | POA: Diagnosis present

## 2019-12-04 DIAGNOSIS — Z880 Allergy status to penicillin: Secondary | ICD-10-CM | POA: Diagnosis not present

## 2019-12-04 DIAGNOSIS — I110 Hypertensive heart disease with heart failure: Secondary | ICD-10-CM | POA: Diagnosis present

## 2019-12-04 DIAGNOSIS — I420 Dilated cardiomyopathy: Secondary | ICD-10-CM | POA: Diagnosis present

## 2019-12-04 DIAGNOSIS — F17210 Nicotine dependence, cigarettes, uncomplicated: Secondary | ICD-10-CM | POA: Diagnosis present

## 2019-12-04 DIAGNOSIS — I4819 Other persistent atrial fibrillation: Secondary | ICD-10-CM | POA: Diagnosis present

## 2019-12-28 DIAGNOSIS — I4811 Longstanding persistent atrial fibrillation: Secondary | ICD-10-CM | POA: Diagnosis not present

## 2019-12-28 DIAGNOSIS — I420 Dilated cardiomyopathy: Secondary | ICD-10-CM | POA: Diagnosis not present

## 2019-12-28 DIAGNOSIS — I10 Essential (primary) hypertension: Secondary | ICD-10-CM | POA: Diagnosis not present

## 2019-12-28 DIAGNOSIS — G4733 Obstructive sleep apnea (adult) (pediatric): Secondary | ICD-10-CM | POA: Diagnosis not present

## 2020-01-01 ENCOUNTER — Ambulatory Visit: Payer: Commercial Managed Care - PPO | Admitting: Neurology

## 2020-01-06 NOTE — Progress Notes (Signed)
NEUROLOGY FOLLOW UP OFFICE NOTE  Timothy Macias [id number]  HISTORY OF PRESENT ILLNESS: Timothy Macias is a 59 year old manwith atrial fibrillation, on anticoagulation,who follows up for peripheral neuropathy, BPPV  UPDATE: Further workup for neuropathy and diplopia:   09/03/2019 LABS:  ANA negative; sed rate mildly elevated at 33; SPEP/IFE negative for monoclonal protein; B6 4.2; ACh R binding antibodies and striated muscle antibodies negative  He did see Dr. Allena Katz, the eye specialist.  There was not much that can be done except surgery.  He deferred. He has since had heart ablation. He went to physical therapy for gait but they didn't perform vestibular rehab.  Still reports vertigo, unchanged.  Unable to ambulate except with a walker.   HISTORY: Since January, he started experiencing "dizziness"and balance issues which has gradually progressed.He states he is stumbling. He feels off balance. He reports lack of coordination. When he walks, his feet "don't go where my brain tells them". He feels numbness and tingling in his feet and has trouble feeling the ground when walking. He has had several falls. He also reports low back pain as well as pain in the legs and feet. He also reports numbness in the hands and arms as well. The pain is stabbing and burning. Arms and legs feel weak He also reports dizziness. When he stands up, he feels dizzy described as the room moving. It lasts about a few minutes. He also reports fairly constant horizontal double vision. It resolves when covering either eye. Severity fluctuates, worse later in the day. Sometimes he slurs his speech. Sometimes trouble swallowing but nothing significant. Hearing is fine. No tinnitus. No significant facial numbness. He was started on gabapentin for the pain. He has been on Cymbalta off and on for a couple of years for depression and arthritic pain. He has neck pain. No changes in bowel or  bladder function. No known diabetes. He has not seen the eye doctor for the double vision.   06/04/19  CT of the head without contrast:  negative. 06/25/19 LABS:  B12 592, TSH 1.760, Hgb A1c 6.1. 07/02/19 MRI Brain wo:  Multiple punctate T2 and FLAIR signal within the cerebral hemispheric and subcortical white matter, likely small vessel disease.  My suspicion for demyelinating disease is low. 07/07/19:  NCV-EMG demonstrated severe active on chronic sensorimotor axonal polyneuropathy, worse on left.  To rule out possible superimposed multilevel radiculopathy, MRI lumbar spine performed on 07/29/19, which showed compressive spinal stenosis from T12-L1 to L3-4 with moderate spinal stenosis at L4-5.  He was evaluated by neurosurgery who felt symptoms more likely related to polyneuropathy and that given severity of polyneuropathy, surgery would not be beneficial anyway.   07/24/19 Ophthalmology (Dr. Alben Spittle):  Exam demonstrated decompensated exodeviation.  He was being referred to Dr. Rodman Pickle, scheduled for 9/29.  PAST MEDICAL HISTORY: Past Medical History:  Diagnosis Date  . Atrial fibrillation (HCC)   . Fatty liver   . Hypertension   . Sleep apnea    wears CPAP    MEDICATIONS: Current Outpatient Medications on File Prior to Visit  Medication Sig Dispense Refill  . apixaban (ELIQUIS) 5 MG TABS tablet Take 5 mg by mouth daily.    . Cholecalciferol (VITAMIN D3) 25 MCG (1000 UT) CAPS Take 1 capsule by mouth daily.    . Coenzyme Q10 10 MG capsule Take 1 capsule by mouth daily.    . cyclobenzaprine (FLEXERIL) 10 MG tablet Take 20 mg by mouth at bedtime.    Marland Kitchen  digoxin (LANOXIN) 0.125 MG tablet 0.125 mg.     . diphenhydramine-acetaminophen (TYLENOL PM) 25-500 MG TABS tablet Take 2 tablets by mouth at bedtime as needed.    . DULoxetine (CYMBALTA) 60 MG capsule Take 60 mg by mouth 2 (two) times daily.    . furosemide (LASIX) 80 MG tablet Take 80 mg by mouth daily.     Marland Kitchen gabapentin (NEURONTIN) 300 MG  capsule at bedtime.     Marland Kitchen lisinopril (ZESTRIL) 5 MG tablet Take 2.5 mg by mouth daily.    Marland Kitchen LORazepam (ATIVAN) 0.5 MG tablet Take 0.5 mg by mouth 3 (three) times daily.    . Melatonin (MELATONIN MAXIMUM STRENGTH) 5 MG TABS Take 2-3 tablets by mouth at bedtime as needed.    . metoprolol succinate (TOPROL-XL) 100 MG 24 hr tablet Take 150 mg by mouth 2 (two) times daily.    . Multiple Vitamins-Minerals (YOUR LIFE MULTI ADULT GUMMIES) CHEW Chew by mouth.    . Oxycodone HCl 20 MG TABS Take 3 tablets by mouth daily. Takes 3 tablets 3 times a day    . pantoprazole (PROTONIX) 40 MG tablet Take 1 tablet by mouth 2 (two) times daily.    . potassium chloride SA (K-DUR) 20 MEQ tablet Take 2 tablets by mouth daily.    . predniSONE (DELTASONE) 20 MG tablet Take 10 mg by mouth daily.     . promethazine (PHENERGAN) 25 MG tablet Take 25 mg by mouth every 4 (four) hours as needed.    . SUMAtriptan (IMITREX) 100 MG tablet Take 100 mg by mouth as directed.    . tamsulosin (FLOMAX) 0.4 MG CAPS capsule Take 0.4 mg by mouth daily.    Marland Kitchen testosterone cypionate (DEPOTESTOSTERONE CYPIONATE) 200 MG/ML injection INJECT ONE ML INTRAMUSCULARLY TWICE A WEEK FOR LOW TESTOSTERONE.     No current facility-administered medications on file prior to visit.    ALLERGIES: Allergies  Allergen Reactions  . Albuterol Swelling  . Amiodarone Rash  . Penicillins Hives    FAMILY HISTORY: Family History  Problem Relation Age of Onset  . Bone cancer Mother   . Other Father        MVA    SOCIAL HISTORY: Social History   Socioeconomic History  . Marital status: Married    Spouse name: [name]  . Number of children: Not on file  . Years of education: Not on file  . Highest education level: Some college, no degree  Occupational History  . Not on file  Tobacco Use  . Smoking status: Current Some Day Smoker    Packs/day: 1.00    Types: Cigarettes  . Smokeless tobacco: Never Used  Substance and Sexual Activity  . Alcohol  use: Not Currently  . Drug use: Never  . Sexual activity: Not on file  Other Topics Concern  . Not on file  Social History Narrative   Patient is right-handed. He lives with his wife in a one level home. He drinks 1-2 diet sodas a day caffeine free. He does not exercise. On disability   Social Determinants of Health   Financial Resource Strain:   . Difficulty of Paying Living Expenses: Not on file  Food Insecurity:   . Worried About Programme researcher, broadcasting/film/video in the Last Year: Not on file  . Ran Out of Food in the Last Year: Not on file  Transportation Needs:   . Lack of Transportation (Medical): Not on file  . Lack of Transportation (Non-Medical): Not on file  Physical Activity:   . Days of Exercise per Week: Not on file  . Minutes of Exercise per Session: Not on file  Stress:   . Feeling of Stress : Not on file  Social Connections:   . Frequency of Communication with Friends and Family: Not on file  . Frequency of Social Gatherings with Friends and Family: Not on file  . Attends Religious Services: Not on file  . Active Member of Clubs or Organizations: Not on file  . Attends Archivist Meetings: Not on file  . Marital Status: Not on file  Intimate Partner Violence:   . Fear of Current or Ex-Partner: Not on file  . Emotionally Abused: Not on file  . Physically Abused: Not on file  . Sexually Abused: Not on file    REVIEW OF SYSTEMS: Constitutional: No fevers, chills, or sweats, no generalized fatigue, change in appetite Eyes: No visual changes, double vision, eye pain Ear, nose and throat: No hearing loss, ear pain, nasal congestion, sore throat Cardiovascular: No chest pain, palpitations Respiratory:  No shortness of breath at rest or with exertion, wheezes GastrointestinaI: No nausea, vomiting, diarrhea, abdominal pain, fecal incontinence Genitourinary:  No dysuria, urinary retention or frequency Musculoskeletal:  No neck pain, back pain Integumentary: No rash,  pruritus, skin lesions Neurological: as above Psychiatric: No depression, insomnia, anxiety Endocrine: No palpitations, fatigue, diaphoresis, mood swings, change in appetite, change in weight, increased thirst Hematologic/Lymphatic:  No purpura, petechiae. Allergic/Immunologic: no itchy/runny eyes, nasal congestion, recent allergic reactions, rashes  PHYSICAL EXAM: Blood pressure 140/90, pulse 78, height 5\' 9"  (1.753 m), weight (!) 340 lb (154.2 kg), SpO2 95 %. General: No acute distress.  Patient appears well-groomed.   Head:  Normocephalic/atraumatic Eyes:  Fundi examined but not visualized Neck: supple, no paraspinal tenderness, full range of motion Heart:  Regular rate and rhythm Lungs:  Clear to auscultation bilaterally Back: No paraspinal tenderness Neurological Exam: alert and oriented to person, place, and time. Attention span and concentration intact, recent and remote memory intact, fund of knowledge intact.  Speech fluent and not dysarthric, language intact.  CN II-XII intact. Bulk and tone normal, muscle strength 5/5 throughout except for bilateral foot drop.  Sensation to pinprick reduced in all extremities up to above elbows and knees.  Reduced vibratory sensation up to knees.  Deep tendon reflexes absent throughout, toes downgoing.  Finger to nose testing intact.  Cannot stand without using upper body strength and still with difficulty.  Very unsteady on his feet.  IMPRESSION: 1.  Severe idiopathic sensorimotor axonal peripheral neuropathy.  Discussed that this may be progressive. 2.  Lumbar spinal stenosis 3.  Bilateral foot drop, secondary to polyneuropathy vs L5 spinal stenosis 4.  Diplopia, secondary to decompensated exodeviation 5.  Benign paroxysmal positional vertigo, stable  PLAN: 1.  Prescribe AFOs 2.  Follow up in one year  Metta Clines, DO  CC:  Gar Ponto, MD

## 2020-01-08 ENCOUNTER — Encounter: Payer: Self-pay | Admitting: Neurology

## 2020-01-08 ENCOUNTER — Other Ambulatory Visit: Payer: Self-pay

## 2020-01-08 ENCOUNTER — Ambulatory Visit (INDEPENDENT_AMBULATORY_CARE_PROVIDER_SITE_OTHER): Payer: Commercial Managed Care - PPO | Admitting: Neurology

## 2020-01-08 VITALS — BP 140/90 | HR 78 | Ht 69.0 in | Wt 340.0 lb

## 2020-01-08 DIAGNOSIS — M21372 Foot drop, left foot: Secondary | ICD-10-CM | POA: Diagnosis not present

## 2020-01-08 DIAGNOSIS — M21371 Foot drop, right foot: Secondary | ICD-10-CM

## 2020-01-08 DIAGNOSIS — M48061 Spinal stenosis, lumbar region without neurogenic claudication: Secondary | ICD-10-CM | POA: Diagnosis not present

## 2020-01-08 DIAGNOSIS — G609 Hereditary and idiopathic neuropathy, unspecified: Secondary | ICD-10-CM

## 2020-01-08 NOTE — Patient Instructions (Addendum)
1.  Will order AFO for you to take to the local DME store.

## 2020-01-11 ENCOUNTER — Encounter: Payer: Self-pay | Admitting: Neurology

## 2020-01-11 ENCOUNTER — Telehealth: Payer: Self-pay | Admitting: Neurology

## 2020-01-11 NOTE — Telephone Encounter (Signed)
Last office note faxed over.

## 2020-01-11 NOTE — Telephone Encounter (Signed)
Britta Mccreedy from [facility] [employer] called and said they've received a referral for this patient but they need the last visit notes sent as well.  Fax: [phone]

## 2020-01-27 DIAGNOSIS — I471 Supraventricular tachycardia: Secondary | ICD-10-CM | POA: Diagnosis not present

## 2020-02-09 DIAGNOSIS — I420 Dilated cardiomyopathy: Secondary | ICD-10-CM | POA: Diagnosis not present

## 2020-02-09 DIAGNOSIS — I4811 Longstanding persistent atrial fibrillation: Secondary | ICD-10-CM | POA: Diagnosis not present

## 2020-02-09 DIAGNOSIS — Z6841 Body Mass Index (BMI) 40.0 and over, adult: Secondary | ICD-10-CM | POA: Diagnosis not present

## 2020-03-18 DIAGNOSIS — K429 Umbilical hernia without obstruction or gangrene: Secondary | ICD-10-CM | POA: Diagnosis not present

## 2020-03-18 DIAGNOSIS — F17200 Nicotine dependence, unspecified, uncomplicated: Secondary | ICD-10-CM | POA: Diagnosis not present

## 2020-03-23 DIAGNOSIS — I420 Dilated cardiomyopathy: Secondary | ICD-10-CM | POA: Diagnosis not present

## 2020-03-23 DIAGNOSIS — I4811 Longstanding persistent atrial fibrillation: Secondary | ICD-10-CM | POA: Diagnosis not present

## 2020-03-23 DIAGNOSIS — I428 Other cardiomyopathies: Secondary | ICD-10-CM | POA: Diagnosis not present

## 2020-03-31 DIAGNOSIS — I4811 Longstanding persistent atrial fibrillation: Secondary | ICD-10-CM | POA: Diagnosis not present

## 2020-04-20 DIAGNOSIS — I420 Dilated cardiomyopathy: Secondary | ICD-10-CM | POA: Diagnosis not present

## 2020-07-04 DIAGNOSIS — R4582 Worries: Secondary | ICD-10-CM | POA: Diagnosis not present

## 2020-07-04 DIAGNOSIS — Z0001 Encounter for general adult medical examination with abnormal findings: Secondary | ICD-10-CM | POA: Diagnosis not present

## 2020-07-04 DIAGNOSIS — F5221 Male erectile disorder: Secondary | ICD-10-CM | POA: Diagnosis not present

## 2020-07-04 DIAGNOSIS — I480 Paroxysmal atrial fibrillation: Secondary | ICD-10-CM | POA: Diagnosis not present

## 2020-07-04 DIAGNOSIS — Z79891 Long term (current) use of opiate analgesic: Secondary | ICD-10-CM | POA: Diagnosis not present

## 2020-07-04 DIAGNOSIS — I10 Essential (primary) hypertension: Secondary | ICD-10-CM | POA: Diagnosis not present

## 2020-07-04 DIAGNOSIS — F331 Major depressive disorder, recurrent, moderate: Secondary | ICD-10-CM | POA: Diagnosis not present

## 2020-07-04 DIAGNOSIS — I5032 Chronic diastolic (congestive) heart failure: Secondary | ICD-10-CM | POA: Diagnosis not present

## 2020-07-05 DIAGNOSIS — I4811 Longstanding persistent atrial fibrillation: Secondary | ICD-10-CM | POA: Diagnosis not present

## 2020-07-05 DIAGNOSIS — I10 Essential (primary) hypertension: Secondary | ICD-10-CM | POA: Diagnosis not present

## 2020-07-05 DIAGNOSIS — G4733 Obstructive sleep apnea (adult) (pediatric): Secondary | ICD-10-CM | POA: Diagnosis not present

## 2020-07-05 DIAGNOSIS — I420 Dilated cardiomyopathy: Secondary | ICD-10-CM | POA: Diagnosis not present

## 2020-09-20 DIAGNOSIS — I4811 Longstanding persistent atrial fibrillation: Secondary | ICD-10-CM | POA: Diagnosis not present

## 2021-01-09 NOTE — Progress Notes (Signed)
Due to the COVID-19 crisis, this telephone visit was done via telephone from my office and it was initiated and consent given by this patient and or family.  Telephone (Audio) Visit As the patient was unable to log onto a video visit, the encounter was switched to a telephone appointment.  The purpose of this telephone visit is to provide medical care while limiting exposure to the novel coronavirus.    Consent was obtained for telephone visit and initiated by pt/family:  Yes Answered questions that patient had about telehealth interaction:  Yes I discussed the limitations, risks, security and privacy concerns of performing an evaluation and management service by telephone. I also discussed with the patient that there may be a patient responsible charge related to this service. The patient expressed understanding and agreed to proceed.  Pt location: Home Physician Location: office Name of referring provider:  Richardean Chimera, MD I connected with .Timothy Macias at patients initiation/request on 01/10/2021 at  8:50 AM EST by telephone and verified that I am speaking with the correct person using two identifiers.  Pt MRN:  [id number] Pt DOB:  1961-11-03  Assessment/Plan:  1.  Severe idiopathic sensorimotor axonal peripheral neuropathy 2.  Lumbar spinal stenosis 3.  Bilateral foot drop secondary to polyneuropathy vs L5 spinal stenosis 4.  Diplopia secondary to decompensated exodeviation  1.  Continue current management 2.  Follow up one year   Need for in person visit now:  No  Subjective:  Timothy Macias is a 60year old manwith atrial fibrillation, on anticoagulation,whofollows up for peripheral neuropathy.  UPDATE: Current medications:  Lyrica 200mg  BID, Cymbalta 60mg  BID, oxycodone  Due to pain, his PCP increased his Lyrica, which has helped.  He wears two AFOs and a walker to help ambulate.  He is doing well at this time.  HISTORY: Since January 2020, he started  experiencing "dizziness"and balance issues which has gradually progressed.He states he is stumbling. He feels off balance. He reports lack of coordination. When he walks, his feet "don't go where my brain tells them". He feels numbness and tingling in his feet and has trouble feeling the ground when walking. He has had several falls. He also reports low back pain as well as pain in the legs and feet. He also reports numbness in the hands and arms as well. The pain is stabbing and burning. Arms and legs feel weak He also reports dizziness. When he stands up, he feels dizzy described as the room moving. It lasts about a few minutes. He also reports fairly constant horizontal double vision. It resolves when covering either eye. Severity fluctuates, worse later in the day. Sometimes he slurs his speech. Sometimes trouble swallowing but nothing significant. Hearing is fine. No tinnitus. No significant facial numbness. He was started on gabapentin for the pain. He has been on Cymbalta off and on for a couple of years for depression and arthritic pain. He has neck pain. No changes in bowel or bladder function. No known diabetes. He has not seen the eye doctor for the double vision.   Past medications:  Gabapentin  06/04/19  CT of the head without contrast:  negative. 2020 LABS: B12 592, TSH 1.760, Hgb A1c 6.1, ANA negative, sed rate 33, SPEP/IFE negative for monoclonal protein, B6 4.2, AChR binding antibodies and striated muscle antibodies negative. 07/02/19 MRI Brain wo: Multiple punctate T2 and FLAIR signal within the cerebral hemispheric and subcortical white matter, likely small vessel disease. My suspicion for demyelinating disease is  low. 07/07/19: NCV-EMG demonstrated severe active on chronic sensorimotor axonal polyneuropathy, worse on left. To rule out possible superimposed multilevel radiculopathy, MRI lumbar spine performed on 07/29/19, which showed compressive spinal  stenosis from T12-L1 to L3-4 with moderate spinal stenosis at L4-5. He was evaluated by neurosurgery who felt symptoms more likely related to polyneuropathy and that given severity of polyneuropathy, surgery would not be beneficial anyway.  07/24/19 Ophthalmology (Dr. Alben Spittle): Exam demonstrated decompensated exodeviation. He was referred to Dr. Allena Katz, the eye specialist.  There was not much that can be done except surgery.  He deferred.    Objective:   Vitals:   01/10/21 0804  Weight: (!) 344 lb (156 kg)  Height: 5\' 10"  (1.778 m)     Follow Up Instructions:      -I discussed the assessment and treatment plan with the patient. The patient was provided an opportunity to ask questions and all were answered. The patient agreed with the plan and demonstrated an understanding of the instructions.   The patient was advised to call back or seek an in-person evaluation if the symptoms worsen or if the condition fails to improve as anticipated.    Total Time spent in visit with the patient was:  9 minutes   , DO

## 2021-01-10 ENCOUNTER — Encounter: Payer: Self-pay | Admitting: Neurology

## 2021-01-10 ENCOUNTER — Telehealth (INDEPENDENT_AMBULATORY_CARE_PROVIDER_SITE_OTHER): Payer: Commercial Managed Care - PPO | Admitting: Neurology

## 2021-01-10 VITALS — Ht 70.0 in | Wt 344.0 lb

## 2021-01-10 DIAGNOSIS — G609 Hereditary and idiopathic neuropathy, unspecified: Secondary | ICD-10-CM

## 2021-01-10 DIAGNOSIS — M21371 Foot drop, right foot: Secondary | ICD-10-CM

## 2021-01-10 DIAGNOSIS — M21372 Foot drop, left foot: Secondary | ICD-10-CM

## 2021-01-10 DIAGNOSIS — M48061 Spinal stenosis, lumbar region without neurogenic claudication: Secondary | ICD-10-CM

## 2021-01-19 DIAGNOSIS — I4811 Longstanding persistent atrial fibrillation: Secondary | ICD-10-CM | POA: Diagnosis not present

## 2021-01-19 DIAGNOSIS — I420 Dilated cardiomyopathy: Secondary | ICD-10-CM | POA: Diagnosis not present

## 2021-01-19 DIAGNOSIS — I5043 Acute on chronic combined systolic (congestive) and diastolic (congestive) heart failure: Secondary | ICD-10-CM | POA: Diagnosis not present

## 2021-01-30 DIAGNOSIS — I5021 Acute systolic (congestive) heart failure: Secondary | ICD-10-CM | POA: Diagnosis not present

## 2021-02-16 DIAGNOSIS — R531 Weakness: Secondary | ICD-10-CM | POA: Diagnosis not present

## 2021-02-16 DIAGNOSIS — R5381 Other malaise: Secondary | ICD-10-CM | POA: Diagnosis not present

## 2021-02-19 DIAGNOSIS — I5022 Chronic systolic (congestive) heart failure: Secondary | ICD-10-CM | POA: Diagnosis not present

## 2021-02-19 DIAGNOSIS — F17210 Nicotine dependence, cigarettes, uncomplicated: Secondary | ICD-10-CM | POA: Diagnosis present

## 2021-02-19 DIAGNOSIS — I10 Essential (primary) hypertension: Secondary | ICD-10-CM | POA: Diagnosis not present

## 2021-02-19 DIAGNOSIS — E875 Hyperkalemia: Secondary | ICD-10-CM | POA: Diagnosis present

## 2021-02-19 DIAGNOSIS — Z452 Encounter for adjustment and management of vascular access device: Secondary | ICD-10-CM | POA: Diagnosis not present

## 2021-02-19 DIAGNOSIS — G4733 Obstructive sleep apnea (adult) (pediatric): Secondary | ICD-10-CM | POA: Diagnosis present

## 2021-02-19 DIAGNOSIS — I480 Paroxysmal atrial fibrillation: Secondary | ICD-10-CM | POA: Diagnosis not present

## 2021-02-19 DIAGNOSIS — R0902 Hypoxemia: Secondary | ICD-10-CM | POA: Diagnosis not present

## 2021-02-19 DIAGNOSIS — R571 Hypovolemic shock: Secondary | ICD-10-CM | POA: Diagnosis present

## 2021-02-19 DIAGNOSIS — R4182 Altered mental status, unspecified: Secondary | ICD-10-CM | POA: Diagnosis not present

## 2021-02-19 DIAGNOSIS — Z880 Allergy status to penicillin: Secondary | ICD-10-CM | POA: Diagnosis not present

## 2021-02-19 DIAGNOSIS — G9341 Metabolic encephalopathy: Secondary | ICD-10-CM | POA: Diagnosis present

## 2021-02-19 DIAGNOSIS — M5136 Other intervertebral disc degeneration, lumbar region: Secondary | ICD-10-CM | POA: Diagnosis present

## 2021-02-19 DIAGNOSIS — E869 Volume depletion, unspecified: Secondary | ICD-10-CM | POA: Diagnosis not present

## 2021-02-19 DIAGNOSIS — K429 Umbilical hernia without obstruction or gangrene: Secondary | ICD-10-CM | POA: Diagnosis present

## 2021-02-19 DIAGNOSIS — Z885 Allergy status to narcotic agent status: Secondary | ICD-10-CM | POA: Diagnosis not present

## 2021-02-19 DIAGNOSIS — R442 Other hallucinations: Secondary | ICD-10-CM | POA: Diagnosis not present

## 2021-02-19 DIAGNOSIS — R404 Transient alteration of awareness: Secondary | ICD-10-CM | POA: Diagnosis not present

## 2021-02-19 DIAGNOSIS — Z888 Allergy status to other drugs, medicaments and biological substances status: Secondary | ICD-10-CM | POA: Diagnosis not present

## 2021-02-19 DIAGNOSIS — I5031 Acute diastolic (congestive) heart failure: Secondary | ICD-10-CM | POA: Diagnosis not present

## 2021-02-19 DIAGNOSIS — Z20822 Contact with and (suspected) exposure to covid-19: Secondary | ICD-10-CM | POA: Diagnosis present

## 2021-02-19 DIAGNOSIS — Z7901 Long term (current) use of anticoagulants: Secondary | ICD-10-CM | POA: Diagnosis not present

## 2021-02-19 DIAGNOSIS — M1990 Unspecified osteoarthritis, unspecified site: Secondary | ICD-10-CM | POA: Diagnosis present

## 2021-02-19 DIAGNOSIS — I959 Hypotension, unspecified: Secondary | ICD-10-CM | POA: Diagnosis not present

## 2021-02-19 DIAGNOSIS — N179 Acute kidney failure, unspecified: Secondary | ICD-10-CM | POA: Diagnosis not present

## 2021-02-19 DIAGNOSIS — G8929 Other chronic pain: Secondary | ICD-10-CM | POA: Diagnosis present

## 2021-02-19 DIAGNOSIS — I4811 Longstanding persistent atrial fibrillation: Secondary | ICD-10-CM | POA: Diagnosis not present

## 2021-02-19 DIAGNOSIS — I4819 Other persistent atrial fibrillation: Secondary | ICD-10-CM | POA: Diagnosis present

## 2021-02-19 DIAGNOSIS — R0989 Other specified symptoms and signs involving the circulatory and respiratory systems: Secondary | ICD-10-CM | POA: Diagnosis not present

## 2021-02-19 DIAGNOSIS — Z6841 Body Mass Index (BMI) 40.0 and over, adult: Secondary | ICD-10-CM | POA: Diagnosis not present

## 2021-02-19 DIAGNOSIS — M25519 Pain in unspecified shoulder: Secondary | ICD-10-CM | POA: Diagnosis not present

## 2021-02-19 DIAGNOSIS — R5381 Other malaise: Secondary | ICD-10-CM | POA: Diagnosis present

## 2021-02-19 DIAGNOSIS — I5043 Acute on chronic combined systolic (congestive) and diastolic (congestive) heart failure: Secondary | ICD-10-CM | POA: Diagnosis present

## 2021-02-19 DIAGNOSIS — I420 Dilated cardiomyopathy: Secondary | ICD-10-CM | POA: Diagnosis present

## 2021-02-19 DIAGNOSIS — I110 Hypertensive heart disease with heart failure: Secondary | ICD-10-CM | POA: Diagnosis present

## 2021-02-19 DIAGNOSIS — N170 Acute kidney failure with tubular necrosis: Secondary | ICD-10-CM | POA: Diagnosis present

## 2021-02-20 DIAGNOSIS — I110 Hypertensive heart disease with heart failure: Secondary | ICD-10-CM | POA: Diagnosis present

## 2021-02-20 DIAGNOSIS — Z888 Allergy status to other drugs, medicaments and biological substances status: Secondary | ICD-10-CM | POA: Diagnosis not present

## 2021-02-20 DIAGNOSIS — I5043 Acute on chronic combined systolic (congestive) and diastolic (congestive) heart failure: Secondary | ICD-10-CM | POA: Diagnosis present

## 2021-02-20 DIAGNOSIS — Z880 Allergy status to penicillin: Secondary | ICD-10-CM | POA: Diagnosis not present

## 2021-02-20 DIAGNOSIS — I4811 Longstanding persistent atrial fibrillation: Secondary | ICD-10-CM | POA: Diagnosis not present

## 2021-02-20 DIAGNOSIS — N170 Acute kidney failure with tubular necrosis: Secondary | ICD-10-CM | POA: Diagnosis present

## 2021-02-20 DIAGNOSIS — G8929 Other chronic pain: Secondary | ICD-10-CM | POA: Diagnosis present

## 2021-02-20 DIAGNOSIS — F17210 Nicotine dependence, cigarettes, uncomplicated: Secondary | ICD-10-CM | POA: Diagnosis present

## 2021-02-20 DIAGNOSIS — G9341 Metabolic encephalopathy: Secondary | ICD-10-CM | POA: Diagnosis present

## 2021-02-20 DIAGNOSIS — I5031 Acute diastolic (congestive) heart failure: Secondary | ICD-10-CM | POA: Diagnosis not present

## 2021-02-20 DIAGNOSIS — I5022 Chronic systolic (congestive) heart failure: Secondary | ICD-10-CM | POA: Diagnosis not present

## 2021-02-20 DIAGNOSIS — I10 Essential (primary) hypertension: Secondary | ICD-10-CM | POA: Diagnosis not present

## 2021-02-20 DIAGNOSIS — R571 Hypovolemic shock: Secondary | ICD-10-CM | POA: Diagnosis present

## 2021-02-20 DIAGNOSIS — M1990 Unspecified osteoarthritis, unspecified site: Secondary | ICD-10-CM | POA: Diagnosis present

## 2021-02-20 DIAGNOSIS — M5136 Other intervertebral disc degeneration, lumbar region: Secondary | ICD-10-CM | POA: Diagnosis present

## 2021-02-20 DIAGNOSIS — E869 Volume depletion, unspecified: Secondary | ICD-10-CM | POA: Diagnosis not present

## 2021-02-20 DIAGNOSIS — G4733 Obstructive sleep apnea (adult) (pediatric): Secondary | ICD-10-CM | POA: Diagnosis present

## 2021-02-20 DIAGNOSIS — I420 Dilated cardiomyopathy: Secondary | ICD-10-CM | POA: Diagnosis present

## 2021-02-20 DIAGNOSIS — Z7901 Long term (current) use of anticoagulants: Secondary | ICD-10-CM | POA: Diagnosis not present

## 2021-02-20 DIAGNOSIS — N179 Acute kidney failure, unspecified: Secondary | ICD-10-CM | POA: Diagnosis not present

## 2021-02-20 DIAGNOSIS — K429 Umbilical hernia without obstruction or gangrene: Secondary | ICD-10-CM | POA: Diagnosis present

## 2021-02-20 DIAGNOSIS — R5381 Other malaise: Secondary | ICD-10-CM | POA: Diagnosis present

## 2021-02-20 DIAGNOSIS — I4819 Other persistent atrial fibrillation: Secondary | ICD-10-CM | POA: Diagnosis present

## 2021-02-20 DIAGNOSIS — Z885 Allergy status to narcotic agent status: Secondary | ICD-10-CM | POA: Diagnosis not present

## 2021-02-20 DIAGNOSIS — Z20822 Contact with and (suspected) exposure to covid-19: Secondary | ICD-10-CM | POA: Diagnosis present

## 2021-02-20 DIAGNOSIS — Z6841 Body Mass Index (BMI) 40.0 and over, adult: Secondary | ICD-10-CM | POA: Diagnosis not present

## 2021-02-20 DIAGNOSIS — E875 Hyperkalemia: Secondary | ICD-10-CM | POA: Diagnosis present

## 2021-02-20 DIAGNOSIS — I480 Paroxysmal atrial fibrillation: Secondary | ICD-10-CM | POA: Diagnosis not present

## 2021-02-26 DIAGNOSIS — I5031 Acute diastolic (congestive) heart failure: Secondary | ICD-10-CM | POA: Diagnosis not present

## 2021-02-26 DIAGNOSIS — N179 Acute kidney failure, unspecified: Secondary | ICD-10-CM | POA: Diagnosis not present

## 2021-02-26 DIAGNOSIS — E875 Hyperkalemia: Secondary | ICD-10-CM | POA: Diagnosis not present

## 2021-02-26 DIAGNOSIS — I10 Essential (primary) hypertension: Secondary | ICD-10-CM | POA: Diagnosis not present

## 2021-03-04 DIAGNOSIS — I5031 Acute diastolic (congestive) heart failure: Secondary | ICD-10-CM | POA: Diagnosis not present

## 2021-03-04 DIAGNOSIS — E875 Hyperkalemia: Secondary | ICD-10-CM | POA: Diagnosis not present

## 2021-03-04 DIAGNOSIS — I10 Essential (primary) hypertension: Secondary | ICD-10-CM | POA: Diagnosis not present

## 2021-03-04 DIAGNOSIS — N179 Acute kidney failure, unspecified: Secondary | ICD-10-CM | POA: Diagnosis not present

## 2021-03-20 DIAGNOSIS — G8929 Other chronic pain: Secondary | ICD-10-CM | POA: Diagnosis present

## 2021-03-20 DIAGNOSIS — I517 Cardiomegaly: Secondary | ICD-10-CM | POA: Diagnosis not present

## 2021-03-20 DIAGNOSIS — R0600 Dyspnea, unspecified: Secondary | ICD-10-CM | POA: Diagnosis not present

## 2021-03-20 DIAGNOSIS — J811 Chronic pulmonary edema: Secondary | ICD-10-CM | POA: Diagnosis not present

## 2021-03-20 DIAGNOSIS — I110 Hypertensive heart disease with heart failure: Secondary | ICD-10-CM | POA: Diagnosis present

## 2021-03-20 DIAGNOSIS — I420 Dilated cardiomyopathy: Secondary | ICD-10-CM | POA: Diagnosis present

## 2021-03-20 DIAGNOSIS — I4891 Unspecified atrial fibrillation: Secondary | ICD-10-CM | POA: Diagnosis not present

## 2021-03-20 DIAGNOSIS — M5136 Other intervertebral disc degeneration, lumbar region: Secondary | ICD-10-CM | POA: Diagnosis present

## 2021-03-20 DIAGNOSIS — I5043 Acute on chronic combined systolic (congestive) and diastolic (congestive) heart failure: Secondary | ICD-10-CM | POA: Diagnosis present

## 2021-03-20 DIAGNOSIS — G4733 Obstructive sleep apnea (adult) (pediatric): Secondary | ICD-10-CM | POA: Diagnosis not present

## 2021-03-20 DIAGNOSIS — Z7901 Long term (current) use of anticoagulants: Secondary | ICD-10-CM | POA: Diagnosis not present

## 2021-03-20 DIAGNOSIS — F17210 Nicotine dependence, cigarettes, uncomplicated: Secondary | ICD-10-CM | POA: Diagnosis not present

## 2021-03-20 DIAGNOSIS — Z20822 Contact with and (suspected) exposure to covid-19: Secondary | ICD-10-CM | POA: Diagnosis present

## 2021-03-20 DIAGNOSIS — Z885 Allergy status to narcotic agent status: Secondary | ICD-10-CM | POA: Diagnosis not present

## 2021-03-20 DIAGNOSIS — K2100 Gastro-esophageal reflux disease with esophagitis, without bleeding: Secondary | ICD-10-CM | POA: Diagnosis present

## 2021-03-20 DIAGNOSIS — K429 Umbilical hernia without obstruction or gangrene: Secondary | ICD-10-CM | POA: Diagnosis present

## 2021-03-20 DIAGNOSIS — M170 Bilateral primary osteoarthritis of knee: Secondary | ICD-10-CM | POA: Diagnosis not present

## 2021-03-20 DIAGNOSIS — N179 Acute kidney failure, unspecified: Secondary | ICD-10-CM | POA: Diagnosis present

## 2021-03-20 DIAGNOSIS — E876 Hypokalemia: Secondary | ICD-10-CM | POA: Diagnosis not present

## 2021-03-20 DIAGNOSIS — I4819 Other persistent atrial fibrillation: Secondary | ICD-10-CM | POA: Diagnosis not present

## 2021-03-20 DIAGNOSIS — Z6841 Body Mass Index (BMI) 40.0 and over, adult: Secondary | ICD-10-CM | POA: Diagnosis not present

## 2021-03-20 DIAGNOSIS — Z888 Allergy status to other drugs, medicaments and biological substances status: Secondary | ICD-10-CM | POA: Diagnosis not present

## 2021-03-20 DIAGNOSIS — Z880 Allergy status to penicillin: Secondary | ICD-10-CM | POA: Diagnosis not present

## 2021-03-20 DIAGNOSIS — I272 Pulmonary hypertension, unspecified: Secondary | ICD-10-CM | POA: Diagnosis present

## 2021-04-06 DIAGNOSIS — I4811 Longstanding persistent atrial fibrillation: Secondary | ICD-10-CM | POA: Diagnosis not present

## 2021-04-06 DIAGNOSIS — I5042 Chronic combined systolic (congestive) and diastolic (congestive) heart failure: Secondary | ICD-10-CM | POA: Diagnosis not present

## 2021-04-06 DIAGNOSIS — I420 Dilated cardiomyopathy: Secondary | ICD-10-CM | POA: Diagnosis not present

## 2021-04-28 DIAGNOSIS — Z7901 Long term (current) use of anticoagulants: Secondary | ICD-10-CM | POA: Diagnosis not present

## 2021-04-28 DIAGNOSIS — K2100 Gastro-esophageal reflux disease with esophagitis, without bleeding: Secondary | ICD-10-CM | POA: Diagnosis not present

## 2021-04-28 DIAGNOSIS — L97519 Non-pressure chronic ulcer of other part of right foot with unspecified severity: Secondary | ICD-10-CM | POA: Diagnosis not present

## 2021-04-28 DIAGNOSIS — I110 Hypertensive heart disease with heart failure: Secondary | ICD-10-CM | POA: Diagnosis not present

## 2021-04-28 DIAGNOSIS — Z79899 Other long term (current) drug therapy: Secondary | ICD-10-CM | POA: Diagnosis not present

## 2021-04-28 DIAGNOSIS — L89613 Pressure ulcer of right heel, stage 3: Secondary | ICD-10-CM | POA: Diagnosis not present

## 2021-04-28 DIAGNOSIS — E11621 Type 2 diabetes mellitus with foot ulcer: Secondary | ICD-10-CM | POA: Diagnosis not present

## 2021-04-28 DIAGNOSIS — Z4800 Encounter for change or removal of nonsurgical wound dressing: Secondary | ICD-10-CM | POA: Diagnosis not present

## 2021-04-28 DIAGNOSIS — I509 Heart failure, unspecified: Secondary | ICD-10-CM | POA: Diagnosis not present

## 2021-04-28 DIAGNOSIS — L89612 Pressure ulcer of right heel, stage 2: Secondary | ICD-10-CM | POA: Diagnosis not present

## 2021-04-28 DIAGNOSIS — I4891 Unspecified atrial fibrillation: Secondary | ICD-10-CM | POA: Diagnosis not present

## 2021-04-28 DIAGNOSIS — Z880 Allergy status to penicillin: Secondary | ICD-10-CM | POA: Diagnosis not present

## 2021-04-28 DIAGNOSIS — Z87891 Personal history of nicotine dependence: Secondary | ICD-10-CM | POA: Diagnosis not present

## 2021-05-02 DIAGNOSIS — L89613 Pressure ulcer of right heel, stage 3: Secondary | ICD-10-CM | POA: Diagnosis not present

## 2021-05-02 DIAGNOSIS — M7731 Calcaneal spur, right foot: Secondary | ICD-10-CM | POA: Diagnosis not present

## 2021-05-10 DIAGNOSIS — Z87891 Personal history of nicotine dependence: Secondary | ICD-10-CM | POA: Diagnosis not present

## 2021-05-10 DIAGNOSIS — I509 Heart failure, unspecified: Secondary | ICD-10-CM | POA: Diagnosis not present

## 2021-05-10 DIAGNOSIS — Z79899 Other long term (current) drug therapy: Secondary | ICD-10-CM | POA: Diagnosis not present

## 2021-05-10 DIAGNOSIS — Z6841 Body Mass Index (BMI) 40.0 and over, adult: Secondary | ICD-10-CM | POA: Diagnosis not present

## 2021-05-10 DIAGNOSIS — Z880 Allergy status to penicillin: Secondary | ICD-10-CM | POA: Diagnosis not present

## 2021-05-10 DIAGNOSIS — L89614 Pressure ulcer of right heel, stage 4: Secondary | ICD-10-CM | POA: Diagnosis not present

## 2021-05-10 DIAGNOSIS — Z7901 Long term (current) use of anticoagulants: Secondary | ICD-10-CM | POA: Diagnosis not present

## 2021-05-10 DIAGNOSIS — E1140 Type 2 diabetes mellitus with diabetic neuropathy, unspecified: Secondary | ICD-10-CM | POA: Diagnosis not present

## 2021-05-10 DIAGNOSIS — G4730 Sleep apnea, unspecified: Secondary | ICD-10-CM | POA: Diagnosis not present

## 2021-05-10 DIAGNOSIS — I110 Hypertensive heart disease with heart failure: Secondary | ICD-10-CM | POA: Diagnosis not present

## 2021-05-10 DIAGNOSIS — K2100 Gastro-esophageal reflux disease with esophagitis, without bleeding: Secondary | ICD-10-CM | POA: Diagnosis not present

## 2021-05-10 DIAGNOSIS — J42 Unspecified chronic bronchitis: Secondary | ICD-10-CM | POA: Diagnosis not present

## 2021-05-15 DIAGNOSIS — L89613 Pressure ulcer of right heel, stage 3: Secondary | ICD-10-CM | POA: Diagnosis not present

## 2021-05-15 DIAGNOSIS — R52 Pain, unspecified: Secondary | ICD-10-CM | POA: Diagnosis not present

## 2021-05-15 DIAGNOSIS — R609 Edema, unspecified: Secondary | ICD-10-CM | POA: Diagnosis not present

## 2021-05-16 DIAGNOSIS — L97519 Non-pressure chronic ulcer of other part of right foot with unspecified severity: Secondary | ICD-10-CM | POA: Diagnosis not present

## 2021-05-16 DIAGNOSIS — E11621 Type 2 diabetes mellitus with foot ulcer: Secondary | ICD-10-CM | POA: Diagnosis not present

## 2021-05-16 DIAGNOSIS — L89612 Pressure ulcer of right heel, stage 2: Secondary | ICD-10-CM | POA: Diagnosis not present

## 2021-05-16 DIAGNOSIS — L89613 Pressure ulcer of right heel, stage 3: Secondary | ICD-10-CM | POA: Diagnosis not present

## 2021-05-16 DIAGNOSIS — Z4800 Encounter for change or removal of nonsurgical wound dressing: Secondary | ICD-10-CM | POA: Diagnosis not present

## 2021-05-16 DIAGNOSIS — I110 Hypertensive heart disease with heart failure: Secondary | ICD-10-CM | POA: Diagnosis not present

## 2021-05-17 DIAGNOSIS — R609 Edema, unspecified: Secondary | ICD-10-CM | POA: Diagnosis not present

## 2021-05-17 DIAGNOSIS — L89613 Pressure ulcer of right heel, stage 3: Secondary | ICD-10-CM | POA: Diagnosis not present

## 2021-05-17 DIAGNOSIS — R52 Pain, unspecified: Secondary | ICD-10-CM | POA: Diagnosis not present

## 2021-05-17 DIAGNOSIS — R94138 Abnormal results of other function studies of peripheral nervous system: Secondary | ICD-10-CM | POA: Diagnosis not present

## 2021-05-30 DIAGNOSIS — Z87891 Personal history of nicotine dependence: Secondary | ICD-10-CM | POA: Diagnosis not present

## 2021-05-30 DIAGNOSIS — I890 Lymphedema, not elsewhere classified: Secondary | ICD-10-CM | POA: Diagnosis not present

## 2021-05-30 DIAGNOSIS — I739 Peripheral vascular disease, unspecified: Secondary | ICD-10-CM | POA: Diagnosis not present

## 2021-05-30 DIAGNOSIS — L89613 Pressure ulcer of right heel, stage 3: Secondary | ICD-10-CM | POA: Diagnosis not present

## 2021-05-30 DIAGNOSIS — I509 Heart failure, unspecified: Secondary | ICD-10-CM | POA: Diagnosis not present

## 2021-05-30 DIAGNOSIS — Z79899 Other long term (current) drug therapy: Secondary | ICD-10-CM | POA: Diagnosis not present

## 2021-05-30 DIAGNOSIS — K2100 Gastro-esophageal reflux disease with esophagitis, without bleeding: Secondary | ICD-10-CM | POA: Diagnosis not present

## 2021-05-30 DIAGNOSIS — G629 Polyneuropathy, unspecified: Secondary | ICD-10-CM | POA: Diagnosis not present

## 2021-05-30 DIAGNOSIS — I110 Hypertensive heart disease with heart failure: Secondary | ICD-10-CM | POA: Diagnosis not present

## 2021-05-30 DIAGNOSIS — Z7901 Long term (current) use of anticoagulants: Secondary | ICD-10-CM | POA: Diagnosis not present

## 2021-05-30 DIAGNOSIS — I4891 Unspecified atrial fibrillation: Secondary | ICD-10-CM | POA: Diagnosis not present

## 2021-05-30 DIAGNOSIS — Z880 Allergy status to penicillin: Secondary | ICD-10-CM | POA: Diagnosis not present

## 2021-05-30 DIAGNOSIS — Z4800 Encounter for change or removal of nonsurgical wound dressing: Secondary | ICD-10-CM | POA: Diagnosis not present

## 2021-06-01 DIAGNOSIS — I4819 Other persistent atrial fibrillation: Secondary | ICD-10-CM | POA: Diagnosis present

## 2021-06-01 DIAGNOSIS — K436 Other and unspecified ventral hernia with obstruction, without gangrene: Secondary | ICD-10-CM | POA: Diagnosis present

## 2021-06-01 DIAGNOSIS — L89152 Pressure ulcer of sacral region, stage 2: Secondary | ICD-10-CM | POA: Diagnosis not present

## 2021-06-01 DIAGNOSIS — G8929 Other chronic pain: Secondary | ICD-10-CM | POA: Diagnosis not present

## 2021-06-01 DIAGNOSIS — D62 Acute posthemorrhagic anemia: Secondary | ICD-10-CM | POA: Diagnosis not present

## 2021-06-01 DIAGNOSIS — J479 Bronchiectasis, uncomplicated: Secondary | ICD-10-CM | POA: Diagnosis not present

## 2021-06-01 DIAGNOSIS — I4510 Unspecified right bundle-branch block: Secondary | ICD-10-CM | POA: Diagnosis not present

## 2021-06-01 DIAGNOSIS — I420 Dilated cardiomyopathy: Secondary | ICD-10-CM | POA: Diagnosis present

## 2021-06-01 DIAGNOSIS — T8112XA Postprocedural septic shock, initial encounter: Secondary | ICD-10-CM | POA: Diagnosis not present

## 2021-06-01 DIAGNOSIS — E875 Hyperkalemia: Secondary | ICD-10-CM | POA: Diagnosis present

## 2021-06-01 DIAGNOSIS — K650 Generalized (acute) peritonitis: Secondary | ICD-10-CM | POA: Diagnosis present

## 2021-06-01 DIAGNOSIS — N179 Acute kidney failure, unspecified: Secondary | ICD-10-CM | POA: Diagnosis present

## 2021-06-01 DIAGNOSIS — R2689 Other abnormalities of gait and mobility: Secondary | ICD-10-CM | POA: Diagnosis not present

## 2021-06-01 DIAGNOSIS — G4733 Obstructive sleep apnea (adult) (pediatric): Secondary | ICD-10-CM | POA: Diagnosis not present

## 2021-06-01 DIAGNOSIS — I10 Essential (primary) hypertension: Secondary | ICD-10-CM | POA: Diagnosis not present

## 2021-06-01 DIAGNOSIS — F32A Depression, unspecified: Secondary | ICD-10-CM | POA: Diagnosis not present

## 2021-06-01 DIAGNOSIS — R0902 Hypoxemia: Secondary | ICD-10-CM | POA: Diagnosis not present

## 2021-06-01 DIAGNOSIS — A419 Sepsis, unspecified organism: Secondary | ICD-10-CM | POA: Diagnosis not present

## 2021-06-01 DIAGNOSIS — Z959 Presence of cardiac and vascular implant and graft, unspecified: Secondary | ICD-10-CM | POA: Diagnosis not present

## 2021-06-01 DIAGNOSIS — D689 Coagulation defect, unspecified: Secondary | ICD-10-CM | POA: Diagnosis present

## 2021-06-01 DIAGNOSIS — E876 Hypokalemia: Secondary | ICD-10-CM | POA: Diagnosis present

## 2021-06-01 DIAGNOSIS — M86171 Other acute osteomyelitis, right ankle and foot: Secondary | ICD-10-CM | POA: Diagnosis not present

## 2021-06-01 DIAGNOSIS — I5042 Chronic combined systolic (congestive) and diastolic (congestive) heart failure: Secondary | ICD-10-CM | POA: Diagnosis present

## 2021-06-01 DIAGNOSIS — I4811 Longstanding persistent atrial fibrillation: Secondary | ICD-10-CM | POA: Diagnosis not present

## 2021-06-01 DIAGNOSIS — I509 Heart failure, unspecified: Secondary | ICD-10-CM | POA: Diagnosis not present

## 2021-06-01 DIAGNOSIS — G629 Polyneuropathy, unspecified: Secondary | ICD-10-CM | POA: Diagnosis not present

## 2021-06-01 DIAGNOSIS — Z792 Long term (current) use of antibiotics: Secondary | ICD-10-CM | POA: Diagnosis not present

## 2021-06-01 DIAGNOSIS — T8144XA Sepsis following a procedure, initial encounter: Secondary | ICD-10-CM | POA: Diagnosis not present

## 2021-06-01 DIAGNOSIS — M6281 Muscle weakness (generalized): Secondary | ICD-10-CM | POA: Diagnosis not present

## 2021-06-01 DIAGNOSIS — T8149XD Infection following a procedure, other surgical site, subsequent encounter: Secondary | ICD-10-CM | POA: Diagnosis not present

## 2021-06-01 DIAGNOSIS — I499 Cardiac arrhythmia, unspecified: Secondary | ICD-10-CM | POA: Diagnosis not present

## 2021-06-01 DIAGNOSIS — Z20822 Contact with and (suspected) exposure to covid-19: Secondary | ICD-10-CM | POA: Diagnosis present

## 2021-06-01 DIAGNOSIS — R0602 Shortness of breath: Secondary | ICD-10-CM | POA: Diagnosis not present

## 2021-06-01 DIAGNOSIS — Z6841 Body Mass Index (BMI) 40.0 and over, adult: Secondary | ICD-10-CM | POA: Diagnosis not present

## 2021-06-01 DIAGNOSIS — Z48815 Encounter for surgical aftercare following surgery on the digestive system: Secondary | ICD-10-CM | POA: Diagnosis not present

## 2021-06-01 DIAGNOSIS — I5043 Acute on chronic combined systolic (congestive) and diastolic (congestive) heart failure: Secondary | ICD-10-CM | POA: Diagnosis not present

## 2021-06-01 DIAGNOSIS — E1140 Type 2 diabetes mellitus with diabetic neuropathy, unspecified: Secondary | ICD-10-CM | POA: Diagnosis present

## 2021-06-01 DIAGNOSIS — I4891 Unspecified atrial fibrillation: Secondary | ICD-10-CM | POA: Diagnosis not present

## 2021-06-01 DIAGNOSIS — R1084 Generalized abdominal pain: Secondary | ICD-10-CM | POA: Diagnosis not present

## 2021-06-01 DIAGNOSIS — L89613 Pressure ulcer of right heel, stage 3: Secondary | ICD-10-CM | POA: Diagnosis present

## 2021-06-01 DIAGNOSIS — K55059 Acute (reversible) ischemia of intestine, part and extent unspecified: Secondary | ICD-10-CM | POA: Diagnosis not present

## 2021-06-01 DIAGNOSIS — E1165 Type 2 diabetes mellitus with hyperglycemia: Secondary | ICD-10-CM | POA: Diagnosis present

## 2021-06-01 DIAGNOSIS — I110 Hypertensive heart disease with heart failure: Secondary | ICD-10-CM | POA: Diagnosis present

## 2021-06-01 DIAGNOSIS — F419 Anxiety disorder, unspecified: Secondary | ICD-10-CM | POA: Diagnosis not present

## 2021-06-01 DIAGNOSIS — K654 Sclerosing mesenteritis: Secondary | ICD-10-CM | POA: Diagnosis present

## 2021-06-01 DIAGNOSIS — J95821 Acute postprocedural respiratory failure: Secondary | ICD-10-CM | POA: Diagnosis not present

## 2021-06-15 DIAGNOSIS — I4811 Longstanding persistent atrial fibrillation: Secondary | ICD-10-CM | POA: Diagnosis not present

## 2021-06-15 DIAGNOSIS — L89613 Pressure ulcer of right heel, stage 3: Secondary | ICD-10-CM | POA: Diagnosis not present

## 2021-06-15 DIAGNOSIS — Z96641 Presence of right artificial hip joint: Secondary | ICD-10-CM | POA: Diagnosis not present

## 2021-06-15 DIAGNOSIS — I110 Hypertensive heart disease with heart failure: Secondary | ICD-10-CM | POA: Diagnosis not present

## 2021-06-15 DIAGNOSIS — L89152 Pressure ulcer of sacral region, stage 2: Secondary | ICD-10-CM | POA: Diagnosis not present

## 2021-06-15 DIAGNOSIS — Z9889 Other specified postprocedural states: Secondary | ICD-10-CM | POA: Diagnosis not present

## 2021-06-15 DIAGNOSIS — M549 Dorsalgia, unspecified: Secondary | ICD-10-CM | POA: Diagnosis not present

## 2021-06-15 DIAGNOSIS — G4733 Obstructive sleep apnea (adult) (pediatric): Secondary | ICD-10-CM | POA: Diagnosis not present

## 2021-06-15 DIAGNOSIS — E871 Hypo-osmolality and hyponatremia: Secondary | ICD-10-CM | POA: Diagnosis not present

## 2021-06-15 DIAGNOSIS — Z48815 Encounter for surgical aftercare following surgery on the digestive system: Secondary | ICD-10-CM | POA: Diagnosis not present

## 2021-06-15 DIAGNOSIS — I5043 Acute on chronic combined systolic (congestive) and diastolic (congestive) heart failure: Secondary | ICD-10-CM | POA: Diagnosis not present

## 2021-06-15 DIAGNOSIS — R1110 Vomiting, unspecified: Secondary | ICD-10-CM | POA: Diagnosis not present

## 2021-06-15 DIAGNOSIS — Z792 Long term (current) use of antibiotics: Secondary | ICD-10-CM | POA: Diagnosis not present

## 2021-06-15 DIAGNOSIS — R5381 Other malaise: Secondary | ICD-10-CM | POA: Diagnosis not present

## 2021-06-15 DIAGNOSIS — R2689 Other abnormalities of gait and mobility: Secondary | ICD-10-CM | POA: Diagnosis not present

## 2021-06-15 DIAGNOSIS — G8929 Other chronic pain: Secondary | ICD-10-CM | POA: Diagnosis not present

## 2021-06-15 DIAGNOSIS — I509 Heart failure, unspecified: Secondary | ICD-10-CM | POA: Diagnosis not present

## 2021-06-15 DIAGNOSIS — N200 Calculus of kidney: Secondary | ICD-10-CM | POA: Diagnosis not present

## 2021-06-15 DIAGNOSIS — K6389 Other specified diseases of intestine: Secondary | ICD-10-CM | POA: Diagnosis not present

## 2021-06-15 DIAGNOSIS — I10 Essential (primary) hypertension: Secondary | ICD-10-CM | POA: Diagnosis not present

## 2021-06-15 DIAGNOSIS — K8020 Calculus of gallbladder without cholecystitis without obstruction: Secondary | ICD-10-CM | POA: Diagnosis not present

## 2021-06-15 DIAGNOSIS — Z8719 Personal history of other diseases of the digestive system: Secondary | ICD-10-CM | POA: Diagnosis not present

## 2021-06-15 DIAGNOSIS — Z959 Presence of cardiac and vascular implant and graft, unspecified: Secondary | ICD-10-CM | POA: Diagnosis not present

## 2021-06-15 DIAGNOSIS — Z7401 Bed confinement status: Secondary | ICD-10-CM | POA: Diagnosis not present

## 2021-06-15 DIAGNOSIS — K56609 Unspecified intestinal obstruction, unspecified as to partial versus complete obstruction: Secondary | ICD-10-CM | POA: Diagnosis not present

## 2021-06-15 DIAGNOSIS — R52 Pain, unspecified: Secondary | ICD-10-CM | POA: Diagnosis not present

## 2021-06-15 DIAGNOSIS — T8149XD Infection following a procedure, other surgical site, subsequent encounter: Secondary | ICD-10-CM | POA: Diagnosis not present

## 2021-06-15 DIAGNOSIS — R112 Nausea with vomiting, unspecified: Secondary | ICD-10-CM | POA: Diagnosis not present

## 2021-06-15 DIAGNOSIS — R0902 Hypoxemia: Secondary | ICD-10-CM | POA: Diagnosis not present

## 2021-06-15 DIAGNOSIS — I959 Hypotension, unspecified: Secondary | ICD-10-CM | POA: Diagnosis not present

## 2021-06-15 DIAGNOSIS — I420 Dilated cardiomyopathy: Secondary | ICD-10-CM | POA: Diagnosis not present

## 2021-06-15 DIAGNOSIS — F32A Depression, unspecified: Secondary | ICD-10-CM | POA: Diagnosis not present

## 2021-06-15 DIAGNOSIS — Z87891 Personal history of nicotine dependence: Secondary | ICD-10-CM | POA: Diagnosis not present

## 2021-06-15 DIAGNOSIS — M86171 Other acute osteomyelitis, right ankle and foot: Secondary | ICD-10-CM | POA: Diagnosis not present

## 2021-06-15 DIAGNOSIS — R279 Unspecified lack of coordination: Secondary | ICD-10-CM | POA: Diagnosis not present

## 2021-06-15 DIAGNOSIS — F419 Anxiety disorder, unspecified: Secondary | ICD-10-CM | POA: Diagnosis not present

## 2021-06-15 DIAGNOSIS — M6281 Muscle weakness (generalized): Secondary | ICD-10-CM | POA: Diagnosis not present

## 2021-06-15 DIAGNOSIS — K760 Fatty (change of) liver, not elsewhere classified: Secondary | ICD-10-CM | POA: Diagnosis not present

## 2021-06-15 DIAGNOSIS — N179 Acute kidney failure, unspecified: Secondary | ICD-10-CM | POA: Diagnosis not present

## 2021-06-15 DIAGNOSIS — G629 Polyneuropathy, unspecified: Secondary | ICD-10-CM | POA: Diagnosis not present

## 2021-06-15 DIAGNOSIS — R932 Abnormal findings on diagnostic imaging of liver and biliary tract: Secondary | ICD-10-CM | POA: Diagnosis not present

## 2021-06-19 ENCOUNTER — Encounter: Payer: Commercial Managed Care - PPO | Admitting: Vascular Surgery

## 2021-06-21 DIAGNOSIS — R932 Abnormal findings on diagnostic imaging of liver and biliary tract: Secondary | ICD-10-CM | POA: Diagnosis not present

## 2021-06-21 DIAGNOSIS — K8020 Calculus of gallbladder without cholecystitis without obstruction: Secondary | ICD-10-CM | POA: Diagnosis not present

## 2021-06-21 DIAGNOSIS — R112 Nausea with vomiting, unspecified: Secondary | ICD-10-CM | POA: Diagnosis not present

## 2021-06-23 DIAGNOSIS — N200 Calculus of kidney: Secondary | ICD-10-CM | POA: Diagnosis not present

## 2021-06-23 DIAGNOSIS — Z9889 Other specified postprocedural states: Secondary | ICD-10-CM | POA: Diagnosis not present

## 2021-06-23 DIAGNOSIS — K56609 Unspecified intestinal obstruction, unspecified as to partial versus complete obstruction: Secondary | ICD-10-CM | POA: Diagnosis not present

## 2021-06-23 DIAGNOSIS — K6389 Other specified diseases of intestine: Secondary | ICD-10-CM | POA: Diagnosis not present

## 2021-06-23 DIAGNOSIS — Z8719 Personal history of other diseases of the digestive system: Secondary | ICD-10-CM | POA: Diagnosis not present

## 2021-06-23 DIAGNOSIS — R1110 Vomiting, unspecified: Secondary | ICD-10-CM | POA: Diagnosis not present

## 2021-06-23 DIAGNOSIS — K760 Fatty (change of) liver, not elsewhere classified: Secondary | ICD-10-CM | POA: Diagnosis not present

## 2021-06-28 ENCOUNTER — Encounter: Payer: Commercial Managed Care - PPO | Admitting: Vascular Surgery

## 2021-06-28 DIAGNOSIS — I110 Hypertensive heart disease with heart failure: Secondary | ICD-10-CM | POA: Diagnosis not present

## 2021-06-28 DIAGNOSIS — Z87891 Personal history of nicotine dependence: Secondary | ICD-10-CM | POA: Diagnosis not present

## 2021-06-28 DIAGNOSIS — E871 Hypo-osmolality and hyponatremia: Secondary | ICD-10-CM | POA: Diagnosis not present

## 2021-06-28 DIAGNOSIS — I509 Heart failure, unspecified: Secondary | ICD-10-CM | POA: Diagnosis not present

## 2021-06-28 DIAGNOSIS — Z96641 Presence of right artificial hip joint: Secondary | ICD-10-CM | POA: Diagnosis not present

## 2021-06-29 DIAGNOSIS — E860 Dehydration: Secondary | ICD-10-CM | POA: Diagnosis present

## 2021-06-29 DIAGNOSIS — N170 Acute kidney failure with tubular necrosis: Secondary | ICD-10-CM | POA: Diagnosis not present

## 2021-06-29 DIAGNOSIS — R0902 Hypoxemia: Secondary | ICD-10-CM | POA: Diagnosis not present

## 2021-06-29 DIAGNOSIS — L89156 Pressure-induced deep tissue damage of sacral region: Secondary | ICD-10-CM | POA: Diagnosis present

## 2021-06-29 DIAGNOSIS — I5043 Acute on chronic combined systolic (congestive) and diastolic (congestive) heart failure: Secondary | ICD-10-CM | POA: Diagnosis not present

## 2021-06-29 DIAGNOSIS — N179 Acute kidney failure, unspecified: Secondary | ICD-10-CM | POA: Diagnosis not present

## 2021-06-29 DIAGNOSIS — Z6841 Body Mass Index (BMI) 40.0 and over, adult: Secondary | ICD-10-CM | POA: Diagnosis not present

## 2021-06-29 DIAGNOSIS — I4891 Unspecified atrial fibrillation: Secondary | ICD-10-CM | POA: Diagnosis not present

## 2021-06-29 DIAGNOSIS — G4733 Obstructive sleep apnea (adult) (pediatric): Secondary | ICD-10-CM | POA: Diagnosis not present

## 2021-06-29 DIAGNOSIS — J9692 Respiratory failure, unspecified with hypercapnia: Secondary | ICD-10-CM | POA: Diagnosis present

## 2021-06-29 DIAGNOSIS — I21A1 Myocardial infarction type 2: Secondary | ICD-10-CM | POA: Diagnosis not present

## 2021-06-29 DIAGNOSIS — I4510 Unspecified right bundle-branch block: Secondary | ICD-10-CM | POA: Diagnosis not present

## 2021-06-29 DIAGNOSIS — I5030 Unspecified diastolic (congestive) heart failure: Secondary | ICD-10-CM | POA: Diagnosis not present

## 2021-06-29 DIAGNOSIS — M7989 Other specified soft tissue disorders: Secondary | ICD-10-CM | POA: Diagnosis not present

## 2021-06-29 DIAGNOSIS — E871 Hypo-osmolality and hyponatremia: Secondary | ICD-10-CM | POA: Diagnosis present

## 2021-06-29 DIAGNOSIS — M6281 Muscle weakness (generalized): Secondary | ICD-10-CM | POA: Diagnosis not present

## 2021-06-29 DIAGNOSIS — I5042 Chronic combined systolic (congestive) and diastolic (congestive) heart failure: Secondary | ICD-10-CM | POA: Diagnosis present

## 2021-06-29 DIAGNOSIS — M86171 Other acute osteomyelitis, right ankle and foot: Secondary | ICD-10-CM | POA: Diagnosis not present

## 2021-06-29 DIAGNOSIS — E873 Alkalosis: Secondary | ICD-10-CM | POA: Diagnosis present

## 2021-06-29 DIAGNOSIS — R6521 Severe sepsis with septic shock: Secondary | ICD-10-CM | POA: Diagnosis present

## 2021-06-29 DIAGNOSIS — L89152 Pressure ulcer of sacral region, stage 2: Secondary | ICD-10-CM | POA: Diagnosis not present

## 2021-06-29 DIAGNOSIS — R54 Age-related physical debility: Secondary | ICD-10-CM | POA: Diagnosis present

## 2021-06-29 DIAGNOSIS — I4811 Longstanding persistent atrial fibrillation: Secondary | ICD-10-CM | POA: Diagnosis not present

## 2021-06-29 DIAGNOSIS — I959 Hypotension, unspecified: Secondary | ICD-10-CM | POA: Diagnosis not present

## 2021-06-29 DIAGNOSIS — L89614 Pressure ulcer of right heel, stage 4: Secondary | ICD-10-CM | POA: Diagnosis present

## 2021-06-29 DIAGNOSIS — R410 Disorientation, unspecified: Secondary | ICD-10-CM | POA: Diagnosis not present

## 2021-06-29 DIAGNOSIS — A419 Sepsis, unspecified organism: Secondary | ICD-10-CM | POA: Diagnosis present

## 2021-06-29 DIAGNOSIS — Z792 Long term (current) use of antibiotics: Secondary | ICD-10-CM | POA: Diagnosis not present

## 2021-06-29 DIAGNOSIS — I420 Dilated cardiomyopathy: Secondary | ICD-10-CM | POA: Diagnosis present

## 2021-06-29 DIAGNOSIS — E872 Acidosis: Secondary | ICD-10-CM | POA: Diagnosis present

## 2021-06-29 DIAGNOSIS — K567 Ileus, unspecified: Secondary | ICD-10-CM | POA: Diagnosis present

## 2021-06-29 DIAGNOSIS — R0603 Acute respiratory distress: Secondary | ICD-10-CM | POA: Diagnosis not present

## 2021-06-29 DIAGNOSIS — R578 Other shock: Secondary | ICD-10-CM | POA: Diagnosis not present

## 2021-06-29 DIAGNOSIS — L89613 Pressure ulcer of right heel, stage 3: Secondary | ICD-10-CM | POA: Diagnosis not present

## 2021-06-29 DIAGNOSIS — Z20822 Contact with and (suspected) exposure to covid-19: Secondary | ICD-10-CM | POA: Diagnosis present

## 2021-06-29 DIAGNOSIS — G8929 Other chronic pain: Secondary | ICD-10-CM | POA: Diagnosis not present

## 2021-06-29 DIAGNOSIS — D649 Anemia, unspecified: Secondary | ICD-10-CM | POA: Diagnosis not present

## 2021-06-29 DIAGNOSIS — R140 Abdominal distension (gaseous): Secondary | ICD-10-CM | POA: Diagnosis not present

## 2021-06-29 DIAGNOSIS — G928 Other toxic encephalopathy: Secondary | ICD-10-CM | POA: Diagnosis present

## 2021-06-29 DIAGNOSIS — M869 Osteomyelitis, unspecified: Secondary | ICD-10-CM | POA: Diagnosis present

## 2021-06-29 DIAGNOSIS — R2689 Other abnormalities of gait and mobility: Secondary | ICD-10-CM | POA: Diagnosis not present

## 2021-06-29 DIAGNOSIS — N178 Other acute kidney failure: Secondary | ICD-10-CM | POA: Diagnosis not present

## 2021-06-29 DIAGNOSIS — L97419 Non-pressure chronic ulcer of right heel and midfoot with unspecified severity: Secondary | ICD-10-CM | POA: Diagnosis present

## 2021-06-29 DIAGNOSIS — R52 Pain, unspecified: Secondary | ICD-10-CM | POA: Diagnosis not present

## 2021-06-29 DIAGNOSIS — I10 Essential (primary) hypertension: Secondary | ICD-10-CM | POA: Diagnosis not present

## 2021-06-29 DIAGNOSIS — R7989 Other specified abnormal findings of blood chemistry: Secondary | ICD-10-CM | POA: Diagnosis not present

## 2021-06-29 DIAGNOSIS — I5020 Unspecified systolic (congestive) heart failure: Secondary | ICD-10-CM | POA: Diagnosis not present

## 2021-06-29 DIAGNOSIS — E876 Hypokalemia: Secondary | ICD-10-CM | POA: Diagnosis present

## 2021-06-29 DIAGNOSIS — Z959 Presence of cardiac and vascular implant and graft, unspecified: Secondary | ICD-10-CM | POA: Diagnosis not present

## 2021-06-29 DIAGNOSIS — Z48815 Encounter for surgical aftercare following surgery on the digestive system: Secondary | ICD-10-CM | POA: Diagnosis not present

## 2021-06-29 DIAGNOSIS — J9601 Acute respiratory failure with hypoxia: Secondary | ICD-10-CM | POA: Diagnosis present

## 2021-06-29 DIAGNOSIS — F32A Depression, unspecified: Secondary | ICD-10-CM | POA: Diagnosis not present

## 2021-06-29 DIAGNOSIS — F419 Anxiety disorder, unspecified: Secondary | ICD-10-CM | POA: Diagnosis not present

## 2021-06-29 DIAGNOSIS — G629 Polyneuropathy, unspecified: Secondary | ICD-10-CM | POA: Diagnosis not present

## 2021-06-29 DIAGNOSIS — R892 Abnormal level of other drugs, medicaments and biological substances in specimens from other organs, systems and tissues: Secondary | ICD-10-CM | POA: Diagnosis not present

## 2021-06-29 DIAGNOSIS — E875 Hyperkalemia: Secondary | ICD-10-CM | POA: Diagnosis not present

## 2021-06-29 DIAGNOSIS — E1142 Type 2 diabetes mellitus with diabetic polyneuropathy: Secondary | ICD-10-CM | POA: Diagnosis present

## 2021-06-30 DIAGNOSIS — Z48815 Encounter for surgical aftercare following surgery on the digestive system: Secondary | ICD-10-CM | POA: Diagnosis not present

## 2021-06-30 DIAGNOSIS — G928 Other toxic encephalopathy: Secondary | ICD-10-CM | POA: Diagnosis present

## 2021-06-30 DIAGNOSIS — L97419 Non-pressure chronic ulcer of right heel and midfoot with unspecified severity: Secondary | ICD-10-CM | POA: Diagnosis present

## 2021-06-30 DIAGNOSIS — D649 Anemia, unspecified: Secondary | ICD-10-CM | POA: Diagnosis not present

## 2021-06-30 DIAGNOSIS — E872 Acidosis: Secondary | ICD-10-CM | POA: Diagnosis present

## 2021-06-30 DIAGNOSIS — J9601 Acute respiratory failure with hypoxia: Secondary | ICD-10-CM | POA: Diagnosis present

## 2021-06-30 DIAGNOSIS — E875 Hyperkalemia: Secondary | ICD-10-CM | POA: Diagnosis not present

## 2021-06-30 DIAGNOSIS — M86171 Other acute osteomyelitis, right ankle and foot: Secondary | ICD-10-CM | POA: Diagnosis not present

## 2021-06-30 DIAGNOSIS — K567 Ileus, unspecified: Secondary | ICD-10-CM | POA: Diagnosis present

## 2021-06-30 DIAGNOSIS — I4811 Longstanding persistent atrial fibrillation: Secondary | ICD-10-CM | POA: Diagnosis not present

## 2021-06-30 DIAGNOSIS — G8929 Other chronic pain: Secondary | ICD-10-CM | POA: Diagnosis not present

## 2021-06-30 DIAGNOSIS — Z20822 Contact with and (suspected) exposure to covid-19: Secondary | ICD-10-CM | POA: Diagnosis present

## 2021-06-30 DIAGNOSIS — L89613 Pressure ulcer of right heel, stage 3: Secondary | ICD-10-CM | POA: Diagnosis not present

## 2021-06-30 DIAGNOSIS — F32A Depression, unspecified: Secondary | ICD-10-CM | POA: Diagnosis not present

## 2021-06-30 DIAGNOSIS — R2689 Other abnormalities of gait and mobility: Secondary | ICD-10-CM | POA: Diagnosis not present

## 2021-06-30 DIAGNOSIS — E860 Dehydration: Secondary | ICD-10-CM | POA: Diagnosis present

## 2021-06-30 DIAGNOSIS — N179 Acute kidney failure, unspecified: Secondary | ICD-10-CM | POA: Diagnosis not present

## 2021-06-30 DIAGNOSIS — F419 Anxiety disorder, unspecified: Secondary | ICD-10-CM | POA: Diagnosis not present

## 2021-06-30 DIAGNOSIS — R7989 Other specified abnormal findings of blood chemistry: Secondary | ICD-10-CM | POA: Diagnosis not present

## 2021-06-30 DIAGNOSIS — I10 Essential (primary) hypertension: Secondary | ICD-10-CM | POA: Diagnosis not present

## 2021-06-30 DIAGNOSIS — Z792 Long term (current) use of antibiotics: Secondary | ICD-10-CM | POA: Diagnosis not present

## 2021-06-30 DIAGNOSIS — R54 Age-related physical debility: Secondary | ICD-10-CM | POA: Diagnosis present

## 2021-06-30 DIAGNOSIS — E1142 Type 2 diabetes mellitus with diabetic polyneuropathy: Secondary | ICD-10-CM | POA: Diagnosis present

## 2021-06-30 DIAGNOSIS — L89152 Pressure ulcer of sacral region, stage 2: Secondary | ICD-10-CM | POA: Diagnosis not present

## 2021-06-30 DIAGNOSIS — J9692 Respiratory failure, unspecified with hypercapnia: Secondary | ICD-10-CM | POA: Diagnosis present

## 2021-06-30 DIAGNOSIS — I4510 Unspecified right bundle-branch block: Secondary | ICD-10-CM | POA: Diagnosis not present

## 2021-06-30 DIAGNOSIS — M7989 Other specified soft tissue disorders: Secondary | ICD-10-CM | POA: Diagnosis not present

## 2021-06-30 DIAGNOSIS — I5043 Acute on chronic combined systolic (congestive) and diastolic (congestive) heart failure: Secondary | ICD-10-CM | POA: Diagnosis not present

## 2021-06-30 DIAGNOSIS — R0603 Acute respiratory distress: Secondary | ICD-10-CM | POA: Diagnosis not present

## 2021-06-30 DIAGNOSIS — E873 Alkalosis: Secondary | ICD-10-CM | POA: Diagnosis present

## 2021-06-30 DIAGNOSIS — I959 Hypotension, unspecified: Secondary | ICD-10-CM | POA: Diagnosis not present

## 2021-06-30 DIAGNOSIS — E876 Hypokalemia: Secondary | ICD-10-CM | POA: Diagnosis present

## 2021-06-30 DIAGNOSIS — I5042 Chronic combined systolic (congestive) and diastolic (congestive) heart failure: Secondary | ICD-10-CM | POA: Diagnosis present

## 2021-06-30 DIAGNOSIS — G4733 Obstructive sleep apnea (adult) (pediatric): Secondary | ICD-10-CM | POA: Diagnosis not present

## 2021-06-30 DIAGNOSIS — L89156 Pressure-induced deep tissue damage of sacral region: Secondary | ICD-10-CM | POA: Diagnosis present

## 2021-06-30 DIAGNOSIS — R578 Other shock: Secondary | ICD-10-CM | POA: Diagnosis not present

## 2021-06-30 DIAGNOSIS — I420 Dilated cardiomyopathy: Secondary | ICD-10-CM | POA: Diagnosis present

## 2021-06-30 DIAGNOSIS — Z959 Presence of cardiac and vascular implant and graft, unspecified: Secondary | ICD-10-CM | POA: Diagnosis not present

## 2021-06-30 DIAGNOSIS — M6281 Muscle weakness (generalized): Secondary | ICD-10-CM | POA: Diagnosis not present

## 2021-06-30 DIAGNOSIS — R892 Abnormal level of other drugs, medicaments and biological substances in specimens from other organs, systems and tissues: Secondary | ICD-10-CM | POA: Diagnosis not present

## 2021-06-30 DIAGNOSIS — L89614 Pressure ulcer of right heel, stage 4: Secondary | ICD-10-CM | POA: Diagnosis present

## 2021-06-30 DIAGNOSIS — E871 Hypo-osmolality and hyponatremia: Secondary | ICD-10-CM | POA: Diagnosis present

## 2021-06-30 DIAGNOSIS — R6521 Severe sepsis with septic shock: Secondary | ICD-10-CM | POA: Diagnosis present

## 2021-06-30 DIAGNOSIS — N170 Acute kidney failure with tubular necrosis: Secondary | ICD-10-CM | POA: Diagnosis not present

## 2021-06-30 DIAGNOSIS — A419 Sepsis, unspecified organism: Secondary | ICD-10-CM | POA: Diagnosis present

## 2021-06-30 DIAGNOSIS — N178 Other acute kidney failure: Secondary | ICD-10-CM | POA: Diagnosis not present

## 2021-06-30 DIAGNOSIS — I5030 Unspecified diastolic (congestive) heart failure: Secondary | ICD-10-CM | POA: Diagnosis not present

## 2021-06-30 DIAGNOSIS — I4891 Unspecified atrial fibrillation: Secondary | ICD-10-CM | POA: Diagnosis not present

## 2021-06-30 DIAGNOSIS — I21A1 Myocardial infarction type 2: Secondary | ICD-10-CM | POA: Diagnosis not present

## 2021-06-30 DIAGNOSIS — M869 Osteomyelitis, unspecified: Secondary | ICD-10-CM | POA: Diagnosis present

## 2021-06-30 DIAGNOSIS — Z6841 Body Mass Index (BMI) 40.0 and over, adult: Secondary | ICD-10-CM | POA: Diagnosis not present

## 2021-06-30 DIAGNOSIS — I5020 Unspecified systolic (congestive) heart failure: Secondary | ICD-10-CM | POA: Diagnosis not present

## 2021-06-30 DIAGNOSIS — G629 Polyneuropathy, unspecified: Secondary | ICD-10-CM | POA: Diagnosis not present

## 2021-07-11 DIAGNOSIS — Z959 Presence of cardiac and vascular implant and graft, unspecified: Secondary | ICD-10-CM | POA: Diagnosis not present

## 2021-07-11 DIAGNOSIS — I4811 Longstanding persistent atrial fibrillation: Secondary | ICD-10-CM | POA: Diagnosis not present

## 2021-07-11 DIAGNOSIS — M6281 Muscle weakness (generalized): Secondary | ICD-10-CM | POA: Diagnosis not present

## 2021-07-11 DIAGNOSIS — R5381 Other malaise: Secondary | ICD-10-CM | POA: Diagnosis not present

## 2021-07-11 DIAGNOSIS — G8929 Other chronic pain: Secondary | ICD-10-CM | POA: Diagnosis not present

## 2021-07-11 DIAGNOSIS — M86171 Other acute osteomyelitis, right ankle and foot: Secondary | ICD-10-CM | POA: Diagnosis not present

## 2021-07-11 DIAGNOSIS — Z792 Long term (current) use of antibiotics: Secondary | ICD-10-CM | POA: Diagnosis not present

## 2021-07-11 DIAGNOSIS — I10 Essential (primary) hypertension: Secondary | ICD-10-CM | POA: Diagnosis not present

## 2021-07-11 DIAGNOSIS — L89613 Pressure ulcer of right heel, stage 3: Secondary | ICD-10-CM | POA: Diagnosis not present

## 2021-07-11 DIAGNOSIS — I420 Dilated cardiomyopathy: Secondary | ICD-10-CM | POA: Diagnosis not present

## 2021-07-11 DIAGNOSIS — E875 Hyperkalemia: Secondary | ICD-10-CM | POA: Diagnosis not present

## 2021-07-11 DIAGNOSIS — M869 Osteomyelitis, unspecified: Secondary | ICD-10-CM | POA: Diagnosis not present

## 2021-07-11 DIAGNOSIS — L89152 Pressure ulcer of sacral region, stage 2: Secondary | ICD-10-CM | POA: Diagnosis not present

## 2021-07-11 DIAGNOSIS — Z7401 Bed confinement status: Secondary | ICD-10-CM | POA: Diagnosis not present

## 2021-07-11 DIAGNOSIS — I4821 Permanent atrial fibrillation: Secondary | ICD-10-CM | POA: Diagnosis not present

## 2021-07-11 DIAGNOSIS — F32A Depression, unspecified: Secondary | ICD-10-CM | POA: Diagnosis not present

## 2021-07-11 DIAGNOSIS — F419 Anxiety disorder, unspecified: Secondary | ICD-10-CM | POA: Diagnosis not present

## 2021-07-11 DIAGNOSIS — I5043 Acute on chronic combined systolic (congestive) and diastolic (congestive) heart failure: Secondary | ICD-10-CM | POA: Diagnosis not present

## 2021-07-11 DIAGNOSIS — I4891 Unspecified atrial fibrillation: Secondary | ICD-10-CM | POA: Diagnosis not present

## 2021-07-11 DIAGNOSIS — Z48815 Encounter for surgical aftercare following surgery on the digestive system: Secondary | ICD-10-CM | POA: Diagnosis not present

## 2021-07-11 DIAGNOSIS — I5030 Unspecified diastolic (congestive) heart failure: Secondary | ICD-10-CM | POA: Diagnosis not present

## 2021-07-11 DIAGNOSIS — R279 Unspecified lack of coordination: Secondary | ICD-10-CM | POA: Diagnosis not present

## 2021-07-11 DIAGNOSIS — R2689 Other abnormalities of gait and mobility: Secondary | ICD-10-CM | POA: Diagnosis not present

## 2021-07-11 DIAGNOSIS — G4733 Obstructive sleep apnea (adult) (pediatric): Secondary | ICD-10-CM | POA: Diagnosis not present

## 2021-07-11 DIAGNOSIS — I5042 Chronic combined systolic (congestive) and diastolic (congestive) heart failure: Secondary | ICD-10-CM | POA: Diagnosis not present

## 2021-07-11 DIAGNOSIS — G629 Polyneuropathy, unspecified: Secondary | ICD-10-CM | POA: Diagnosis not present

## 2021-07-12 ENCOUNTER — Encounter: Payer: Commercial Managed Care - PPO | Admitting: Vascular Surgery

## 2021-07-20 DIAGNOSIS — I4821 Permanent atrial fibrillation: Secondary | ICD-10-CM | POA: Diagnosis not present

## 2021-07-20 DIAGNOSIS — I5042 Chronic combined systolic (congestive) and diastolic (congestive) heart failure: Secondary | ICD-10-CM | POA: Diagnosis not present

## 2021-07-25 DIAGNOSIS — M869 Osteomyelitis, unspecified: Secondary | ICD-10-CM | POA: Diagnosis not present

## 2021-07-25 DIAGNOSIS — L89613 Pressure ulcer of right heel, stage 3: Secondary | ICD-10-CM | POA: Diagnosis not present

## 2021-09-29 DIAGNOSIS — I5032 Chronic diastolic (congestive) heart failure: Secondary | ICD-10-CM | POA: Diagnosis not present

## 2021-09-29 DIAGNOSIS — I10 Essential (primary) hypertension: Secondary | ICD-10-CM | POA: Diagnosis not present

## 2021-09-29 DIAGNOSIS — I4891 Unspecified atrial fibrillation: Secondary | ICD-10-CM | POA: Diagnosis not present

## 2021-10-04 ENCOUNTER — Encounter: Payer: Commercial Managed Care - PPO | Admitting: Vascular Surgery

## 2021-10-11 DIAGNOSIS — Z23 Encounter for immunization: Secondary | ICD-10-CM | POA: Diagnosis not present

## 2021-10-22 DIAGNOSIS — I5032 Chronic diastolic (congestive) heart failure: Secondary | ICD-10-CM | POA: Diagnosis not present

## 2021-10-22 DIAGNOSIS — I10 Essential (primary) hypertension: Secondary | ICD-10-CM | POA: Diagnosis not present

## 2021-10-22 DIAGNOSIS — I4891 Unspecified atrial fibrillation: Secondary | ICD-10-CM | POA: Diagnosis not present

## 2021-11-03 ENCOUNTER — Other Ambulatory Visit: Payer: Self-pay | Admitting: *Deleted

## 2021-11-03 DIAGNOSIS — R6889 Other general symptoms and signs: Secondary | ICD-10-CM

## 2021-11-05 DIAGNOSIS — I10 Essential (primary) hypertension: Secondary | ICD-10-CM | POA: Diagnosis not present

## 2021-11-05 DIAGNOSIS — I4891 Unspecified atrial fibrillation: Secondary | ICD-10-CM | POA: Diagnosis not present

## 2021-11-05 DIAGNOSIS — F331 Major depressive disorder, recurrent, moderate: Secondary | ICD-10-CM | POA: Diagnosis not present

## 2021-11-08 ENCOUNTER — Ambulatory Visit (INDEPENDENT_AMBULATORY_CARE_PROVIDER_SITE_OTHER): Payer: Commercial Managed Care - PPO

## 2021-11-08 ENCOUNTER — Encounter: Payer: Commercial Managed Care - PPO | Admitting: Vascular Surgery

## 2021-11-08 ENCOUNTER — Other Ambulatory Visit: Payer: Self-pay

## 2021-11-08 ENCOUNTER — Encounter: Payer: Self-pay | Admitting: Vascular Surgery

## 2021-11-08 ENCOUNTER — Ambulatory Visit (INDEPENDENT_AMBULATORY_CARE_PROVIDER_SITE_OTHER): Payer: Commercial Managed Care - PPO | Admitting: Vascular Surgery

## 2021-11-08 VITALS — BP 96/64 | HR 98 | Temp 98.4°F | Resp 18 | Ht 70.0 in | Wt 293.0 lb

## 2021-11-08 DIAGNOSIS — R6889 Other general symptoms and signs: Secondary | ICD-10-CM | POA: Diagnosis not present

## 2021-11-08 NOTE — Progress Notes (Signed)
[facility] [place]  Patient name: Timothy Macias MRN: [id number] DOB: 1961/10/30 Sex: male  REASON FOR CONSULT: Evaluation lower extremity arterial insufficiency  HPI: Timothy Macias is a 60 y.o. male, who is here today for evaluation of his lower extremity arterial flow.  He has a very complicated past history.  He suffered a incarcerated umbilical hernia and had helicopter transport to [facility]  He had emergency resection suffered prolonged hospitalization with renal failure and heart failure.  He developed a right heel ulcer during this prolonged hospitalization.  He has had appropriate care at the [facility] wound center and has nearly healed the ulceration.  He is here today for evaluation of adequacy of arterial flow.  He currently is in a wheelchair and nursing facility.  He reports that he has had some improvement and can stand with assist and is taken 1 or 2 steps.  He wears a cock-up splint on both lower extremities.  He reports pain throughout his legs bilaterally not related to activity obviously and not consistent with arterial rest pain.  He is in chronic atrial fibrillation  Past Medical History:  Diagnosis Date   Atrial fibrillation (HCC)    Fatty liver    Hypertension    Sleep apnea    wears CPAP    Family History  Problem Relation Age of Onset   Bone cancer Mother    Other Father        MVA    SOCIAL HISTORY: Social History   Socioeconomic History   Marital status: Married    Spouse name: [name]   Number of children: Not on file   Years of education: Not on file   Highest education level: Some college, no degree  Occupational History   Not on file  Tobacco Use   Smoking status: Some Days    Packs/day: 1.00    Types: Cigarettes   Smokeless tobacco: Never  Vaping Use   Vaping Use: Not on file  Substance and Sexual Activity   Alcohol use: Not Currently   Drug use: Never   Sexual activity:  Not on file  Other Topics Concern   Not on file  Social History Narrative   Patient is right-handed. He lives with his wife in a one level home. He drinks 1-2 diet sodas a day caffeine free. He does not exercise. On disability   Social Determinants of Corporate investment banker Strain: Not on file  Food Insecurity: Not on file  Transportation Needs: Not on file  Physical Activity: Not on file  Stress: Not on file  Social Connections: Not on file  Intimate Partner Violence: Not on file    Allergies  Allergen Reactions   Albuterol Swelling   Amiodarone Rash   Penicillins Hives    Current Outpatient Medications  Medication Sig Dispense Refill   apixaban (ELIQUIS) 5 MG TABS tablet Take 5 mg by mouth daily.     Cholecalciferol (VITAMIN D3) 25 MCG (1000 UT) CAPS Take 1 capsule by mouth daily.     Coenzyme Q10 10 MG capsule Take 1 capsule by mouth daily.     cyclobenzaprine (FLEXERIL) 10 MG tablet Take 20 mg by mouth at bedtime.     digoxin (LANOXIN) 0.125 MG tablet 0.125 mg.      diphenhydramine-acetaminophen (TYLENOL PM) 25-500 MG TABS tablet Take 2 tablets by mouth at bedtime as needed.     dofetilide (TIKOSYN) 500 MCG capsule Take by mouth.  DULoxetine (CYMBALTA) 60 MG capsule Take 60 mg by mouth 2 (two) times daily.     lisinopril (ZESTRIL) 5 MG tablet Take 2.5 mg by mouth daily.     LORazepam (ATIVAN) 0.5 MG tablet Take 0.5 mg by mouth 3 (three) times daily.     melatonin 5 MG TABS Take 2-3 tablets by mouth at bedtime as needed.     metoprolol succinate (TOPROL-XL) 100 MG 24 hr tablet Take 150 mg by mouth 2 (two) times daily.     Multiple Vitamins-Minerals (YOUR LIFE MULTI ADULT GUMMIES) CHEW Chew by mouth.     Oxycodone HCl 20 MG TABS Take 3 tablets by mouth daily. Takes 3 tablets 3 times a day     pantoprazole (PROTONIX) 40 MG tablet Take 1 tablet by mouth 2 (two) times daily.     potassium chloride SA (K-DUR) 20 MEQ tablet Take 2 tablets by mouth daily.     pregabalin  (LYRICA) 200 MG capsule Take 200 mg by mouth 2 (two) times daily.     SUMAtriptan (IMITREX) 100 MG tablet Take 100 mg by mouth as directed.     tamsulosin (FLOMAX) 0.4 MG CAPS capsule Take 0.4 mg by mouth daily.     testosterone cypionate (DEPOTESTOSTERONE CYPIONATE) 200 MG/ML injection INJECT ONE ML INTRAMUSCULARLY TWICE A WEEK FOR LOW TESTOSTERONE.     torsemide (DEMADEX) 100 MG tablet Take 100 mg by mouth 2 (two) times daily.     furosemide (LASIX) 80 MG tablet Take 80 mg by mouth daily.  (Patient not taking: No sig reported)     gabapentin (NEURONTIN) 300 MG capsule at bedtime.  (Patient not taking: No sig reported)     predniSONE (DELTASONE) 20 MG tablet Take 10 mg by mouth daily.  (Patient not taking: No sig reported)     promethazine (PHENERGAN) 25 MG tablet Take 25 mg by mouth every 4 (four) hours as needed. (Patient not taking: No sig reported)     No current facility-administered medications for this visit.    REVIEW OF SYSTEMS:  [X]  denotes positive finding, [ ]  denotes negative finding Cardiac  Comments:  Chest pain or chest pressure:    Shortness of breath upon exertion:    Short of breath when lying flat:    Irregular heart rhythm: x       Vascular    Pain in calf, thigh, or hip brought on by ambulation:    Pain in feet at night that wakes you up from your sleep:     Blood clot in your veins:    Leg swelling:  x       Pulmonary    Oxygen at home:    Productive cough:  x   Wheezing:         Neurologic    Sudden weakness in arms or legs:     Sudden numbness in arms or legs:     Sudden onset of difficulty speaking or slurred speech:    Temporary loss of vision in one eye:     Problems with dizziness:  x       Gastrointestinal    Blood in stool:     Vomited blood:         Genitourinary    Burning when urinating:     Blood in urine:        Psychiatric    Major depression:         Hematologic    Bleeding problems:    Problems  with blood clotting too easily:         Skin    Rashes or ulcers:        Constitutional    Fever or chills:      PHYSICAL EXAM: Vitals:   11/08/21 1240  BP: 96/64  Pulse: 98  Resp: 18  Temp: 98.4 F (36.9 C)  TempSrc: Oral  SpO2: 94%  Weight: 293 lb (132.9 kg)  Height: 5\' 10"  (1.778 m)    GENERAL: The patient is a well-nourished male, in no acute distress. The vital signs are documented above. CARDIOVASCULAR: 2+ radial pulses.  I do not palpate pedal pulses bilaterally. PULMONARY: There is good air exchange  MUSCULOSKELETAL: There are no major deformities or cyanosis. NEUROLOGIC: No focal weakness or paresthesias are detected. SKIN: There are no ulcers or rashes noted.  He does have a 1-1/2 cm eschar on his right heel.  This is not fixed to the bone and is mobile in the skin. PSYCHIATRIC: The patient has a normal affect.  DATA:  Noninvasive studies in our office today were reviewed with the patient and compared to prior noninvasive studies from May 2022.  He does have normal ankle arm index bilaterally.  He may have some calcification of his vessels which could falsely elevate this number.  He does have normal triphasic waveforms in his left foot and near normal biphasic in the right foot.  This would indicate no severe arterial insufficiency  MEDICAL ISSUES: Discussed this with the patient.  He does not have any evidence of critical limb ischemia.  I did explain the very difficult nature of treating heel ulceration but does appear to be making excellent progress with this.  He does not have an open ulcer at this time and simply has a small residual eschar which appears to be healing as well.  I would not recommend any further invasive testing and would recommend continued treatment with observation and rehab.  He will see June 2022 again on an as-needed basis   [place], MD [facility] Tel [phone] Pager [phone]  Note: Portions of this report may have  been transcribed using voice recognition software.  Every effort has been made to ensure accuracy; however, inadvertent computerized transcription errors may still be present.

## 2021-11-21 ENCOUNTER — Encounter: Payer: Self-pay | Admitting: Neurology

## 2021-12-11 DIAGNOSIS — E876 Hypokalemia: Secondary | ICD-10-CM | POA: Diagnosis not present

## 2021-12-11 DIAGNOSIS — L89152 Pressure ulcer of sacral region, stage 2: Secondary | ICD-10-CM | POA: Diagnosis not present

## 2021-12-11 DIAGNOSIS — M86171 Other acute osteomyelitis, right ankle and foot: Secondary | ICD-10-CM | POA: Diagnosis not present

## 2021-12-11 DIAGNOSIS — F419 Anxiety disorder, unspecified: Secondary | ICD-10-CM | POA: Diagnosis not present

## 2021-12-11 DIAGNOSIS — G8929 Other chronic pain: Secondary | ICD-10-CM | POA: Diagnosis not present

## 2021-12-11 DIAGNOSIS — I482 Chronic atrial fibrillation, unspecified: Secondary | ICD-10-CM | POA: Diagnosis not present

## 2021-12-11 DIAGNOSIS — I110 Hypertensive heart disease with heart failure: Secondary | ICD-10-CM | POA: Diagnosis not present

## 2021-12-11 DIAGNOSIS — K219 Gastro-esophageal reflux disease without esophagitis: Secondary | ICD-10-CM | POA: Diagnosis not present

## 2021-12-11 DIAGNOSIS — G609 Hereditary and idiopathic neuropathy, unspecified: Secondary | ICD-10-CM | POA: Diagnosis not present

## 2021-12-11 DIAGNOSIS — I5042 Chronic combined systolic (congestive) and diastolic (congestive) heart failure: Secondary | ICD-10-CM | POA: Diagnosis not present

## 2021-12-11 DIAGNOSIS — K5900 Constipation, unspecified: Secondary | ICD-10-CM | POA: Diagnosis not present

## 2021-12-11 DIAGNOSIS — N401 Enlarged prostate with lower urinary tract symptoms: Secondary | ICD-10-CM | POA: Diagnosis not present

## 2021-12-11 DIAGNOSIS — M5136 Other intervertebral disc degeneration, lumbar region: Secondary | ICD-10-CM | POA: Diagnosis not present

## 2021-12-11 DIAGNOSIS — F331 Major depressive disorder, recurrent, moderate: Secondary | ICD-10-CM | POA: Diagnosis not present

## 2021-12-11 DIAGNOSIS — N39498 Other specified urinary incontinence: Secondary | ICD-10-CM | POA: Diagnosis not present

## 2021-12-11 DIAGNOSIS — G4733 Obstructive sleep apnea (adult) (pediatric): Secondary | ICD-10-CM | POA: Diagnosis not present

## 2021-12-14 DIAGNOSIS — M86171 Other acute osteomyelitis, right ankle and foot: Secondary | ICD-10-CM | POA: Diagnosis not present

## 2021-12-14 DIAGNOSIS — N401 Enlarged prostate with lower urinary tract symptoms: Secondary | ICD-10-CM | POA: Diagnosis not present

## 2021-12-14 DIAGNOSIS — I482 Chronic atrial fibrillation, unspecified: Secondary | ICD-10-CM | POA: Diagnosis not present

## 2021-12-14 DIAGNOSIS — G4733 Obstructive sleep apnea (adult) (pediatric): Secondary | ICD-10-CM | POA: Diagnosis not present

## 2021-12-14 DIAGNOSIS — I5042 Chronic combined systolic (congestive) and diastolic (congestive) heart failure: Secondary | ICD-10-CM | POA: Diagnosis not present

## 2021-12-14 DIAGNOSIS — I110 Hypertensive heart disease with heart failure: Secondary | ICD-10-CM | POA: Diagnosis not present

## 2021-12-15 DIAGNOSIS — M86171 Other acute osteomyelitis, right ankle and foot: Secondary | ICD-10-CM | POA: Diagnosis not present

## 2021-12-15 DIAGNOSIS — N401 Enlarged prostate with lower urinary tract symptoms: Secondary | ICD-10-CM | POA: Diagnosis not present

## 2021-12-15 DIAGNOSIS — I5042 Chronic combined systolic (congestive) and diastolic (congestive) heart failure: Secondary | ICD-10-CM | POA: Diagnosis not present

## 2021-12-15 DIAGNOSIS — I110 Hypertensive heart disease with heart failure: Secondary | ICD-10-CM | POA: Diagnosis not present

## 2021-12-15 DIAGNOSIS — I482 Chronic atrial fibrillation, unspecified: Secondary | ICD-10-CM | POA: Diagnosis not present

## 2021-12-15 DIAGNOSIS — G4733 Obstructive sleep apnea (adult) (pediatric): Secondary | ICD-10-CM | POA: Diagnosis not present

## 2021-12-19 DIAGNOSIS — I482 Chronic atrial fibrillation, unspecified: Secondary | ICD-10-CM | POA: Diagnosis not present

## 2021-12-19 DIAGNOSIS — M86171 Other acute osteomyelitis, right ankle and foot: Secondary | ICD-10-CM | POA: Diagnosis not present

## 2021-12-19 DIAGNOSIS — I5042 Chronic combined systolic (congestive) and diastolic (congestive) heart failure: Secondary | ICD-10-CM | POA: Diagnosis not present

## 2021-12-19 DIAGNOSIS — I110 Hypertensive heart disease with heart failure: Secondary | ICD-10-CM | POA: Diagnosis not present

## 2021-12-19 DIAGNOSIS — N401 Enlarged prostate with lower urinary tract symptoms: Secondary | ICD-10-CM | POA: Diagnosis not present

## 2021-12-19 DIAGNOSIS — G4733 Obstructive sleep apnea (adult) (pediatric): Secondary | ICD-10-CM | POA: Diagnosis not present

## 2021-12-20 DIAGNOSIS — M86171 Other acute osteomyelitis, right ankle and foot: Secondary | ICD-10-CM | POA: Diagnosis not present

## 2021-12-20 DIAGNOSIS — I110 Hypertensive heart disease with heart failure: Secondary | ICD-10-CM | POA: Diagnosis not present

## 2021-12-20 DIAGNOSIS — G4733 Obstructive sleep apnea (adult) (pediatric): Secondary | ICD-10-CM | POA: Diagnosis not present

## 2021-12-20 DIAGNOSIS — I482 Chronic atrial fibrillation, unspecified: Secondary | ICD-10-CM | POA: Diagnosis not present

## 2021-12-20 DIAGNOSIS — N401 Enlarged prostate with lower urinary tract symptoms: Secondary | ICD-10-CM | POA: Diagnosis not present

## 2021-12-20 DIAGNOSIS — I5042 Chronic combined systolic (congestive) and diastolic (congestive) heart failure: Secondary | ICD-10-CM | POA: Diagnosis not present

## 2021-12-28 DIAGNOSIS — I482 Chronic atrial fibrillation, unspecified: Secondary | ICD-10-CM | POA: Diagnosis not present

## 2021-12-28 DIAGNOSIS — M86171 Other acute osteomyelitis, right ankle and foot: Secondary | ICD-10-CM | POA: Diagnosis not present

## 2021-12-28 DIAGNOSIS — I5042 Chronic combined systolic (congestive) and diastolic (congestive) heart failure: Secondary | ICD-10-CM | POA: Diagnosis not present

## 2021-12-28 DIAGNOSIS — G4733 Obstructive sleep apnea (adult) (pediatric): Secondary | ICD-10-CM | POA: Diagnosis not present

## 2021-12-28 DIAGNOSIS — I110 Hypertensive heart disease with heart failure: Secondary | ICD-10-CM | POA: Diagnosis not present

## 2021-12-28 DIAGNOSIS — N401 Enlarged prostate with lower urinary tract symptoms: Secondary | ICD-10-CM | POA: Diagnosis not present

## 2021-12-29 DIAGNOSIS — N401 Enlarged prostate with lower urinary tract symptoms: Secondary | ICD-10-CM | POA: Diagnosis not present

## 2021-12-29 DIAGNOSIS — I110 Hypertensive heart disease with heart failure: Secondary | ICD-10-CM | POA: Diagnosis not present

## 2021-12-29 DIAGNOSIS — G4733 Obstructive sleep apnea (adult) (pediatric): Secondary | ICD-10-CM | POA: Diagnosis not present

## 2021-12-29 DIAGNOSIS — I5042 Chronic combined systolic (congestive) and diastolic (congestive) heart failure: Secondary | ICD-10-CM | POA: Diagnosis not present

## 2021-12-29 DIAGNOSIS — I482 Chronic atrial fibrillation, unspecified: Secondary | ICD-10-CM | POA: Diagnosis not present

## 2021-12-29 DIAGNOSIS — M86171 Other acute osteomyelitis, right ankle and foot: Secondary | ICD-10-CM | POA: Diagnosis not present

## 2022-01-02 DIAGNOSIS — I5042 Chronic combined systolic (congestive) and diastolic (congestive) heart failure: Secondary | ICD-10-CM | POA: Diagnosis not present

## 2022-01-02 DIAGNOSIS — N401 Enlarged prostate with lower urinary tract symptoms: Secondary | ICD-10-CM | POA: Diagnosis not present

## 2022-01-02 DIAGNOSIS — G4733 Obstructive sleep apnea (adult) (pediatric): Secondary | ICD-10-CM | POA: Diagnosis not present

## 2022-01-02 DIAGNOSIS — M86171 Other acute osteomyelitis, right ankle and foot: Secondary | ICD-10-CM | POA: Diagnosis not present

## 2022-01-02 DIAGNOSIS — I482 Chronic atrial fibrillation, unspecified: Secondary | ICD-10-CM | POA: Diagnosis not present

## 2022-01-02 DIAGNOSIS — I110 Hypertensive heart disease with heart failure: Secondary | ICD-10-CM | POA: Diagnosis not present

## 2022-01-04 DIAGNOSIS — I110 Hypertensive heart disease with heart failure: Secondary | ICD-10-CM | POA: Diagnosis not present

## 2022-01-04 DIAGNOSIS — N401 Enlarged prostate with lower urinary tract symptoms: Secondary | ICD-10-CM | POA: Diagnosis not present

## 2022-01-04 DIAGNOSIS — I482 Chronic atrial fibrillation, unspecified: Secondary | ICD-10-CM | POA: Diagnosis not present

## 2022-01-04 DIAGNOSIS — M86171 Other acute osteomyelitis, right ankle and foot: Secondary | ICD-10-CM | POA: Diagnosis not present

## 2022-01-04 DIAGNOSIS — G4733 Obstructive sleep apnea (adult) (pediatric): Secondary | ICD-10-CM | POA: Diagnosis not present

## 2022-01-04 DIAGNOSIS — I5042 Chronic combined systolic (congestive) and diastolic (congestive) heart failure: Secondary | ICD-10-CM | POA: Diagnosis not present

## 2022-01-10 ENCOUNTER — Ambulatory Visit: Payer: Commercial Managed Care - PPO | Admitting: Neurology

## 2022-01-10 DIAGNOSIS — E876 Hypokalemia: Secondary | ICD-10-CM | POA: Diagnosis not present

## 2022-01-10 DIAGNOSIS — M86171 Other acute osteomyelitis, right ankle and foot: Secondary | ICD-10-CM | POA: Diagnosis not present

## 2022-01-10 DIAGNOSIS — I482 Chronic atrial fibrillation, unspecified: Secondary | ICD-10-CM | POA: Diagnosis not present

## 2022-01-10 DIAGNOSIS — I110 Hypertensive heart disease with heart failure: Secondary | ICD-10-CM | POA: Diagnosis not present

## 2022-01-10 DIAGNOSIS — L89152 Pressure ulcer of sacral region, stage 2: Secondary | ICD-10-CM | POA: Diagnosis not present

## 2022-01-10 DIAGNOSIS — K219 Gastro-esophageal reflux disease without esophagitis: Secondary | ICD-10-CM | POA: Diagnosis not present

## 2022-01-10 DIAGNOSIS — F419 Anxiety disorder, unspecified: Secondary | ICD-10-CM | POA: Diagnosis not present

## 2022-01-10 DIAGNOSIS — K5900 Constipation, unspecified: Secondary | ICD-10-CM | POA: Diagnosis not present

## 2022-01-10 DIAGNOSIS — I5042 Chronic combined systolic (congestive) and diastolic (congestive) heart failure: Secondary | ICD-10-CM | POA: Diagnosis not present

## 2022-01-10 DIAGNOSIS — G8929 Other chronic pain: Secondary | ICD-10-CM | POA: Diagnosis not present

## 2022-01-10 DIAGNOSIS — M5136 Other intervertebral disc degeneration, lumbar region: Secondary | ICD-10-CM | POA: Diagnosis not present

## 2022-01-10 DIAGNOSIS — N39498 Other specified urinary incontinence: Secondary | ICD-10-CM | POA: Diagnosis not present

## 2022-01-10 DIAGNOSIS — G4733 Obstructive sleep apnea (adult) (pediatric): Secondary | ICD-10-CM | POA: Diagnosis not present

## 2022-01-10 DIAGNOSIS — F331 Major depressive disorder, recurrent, moderate: Secondary | ICD-10-CM | POA: Diagnosis not present

## 2022-01-10 DIAGNOSIS — N401 Enlarged prostate with lower urinary tract symptoms: Secondary | ICD-10-CM | POA: Diagnosis not present

## 2022-01-10 DIAGNOSIS — G609 Hereditary and idiopathic neuropathy, unspecified: Secondary | ICD-10-CM | POA: Diagnosis not present

## 2022-01-17 DIAGNOSIS — M86171 Other acute osteomyelitis, right ankle and foot: Secondary | ICD-10-CM | POA: Diagnosis not present

## 2022-01-17 DIAGNOSIS — I482 Chronic atrial fibrillation, unspecified: Secondary | ICD-10-CM | POA: Diagnosis not present

## 2022-01-17 DIAGNOSIS — N401 Enlarged prostate with lower urinary tract symptoms: Secondary | ICD-10-CM | POA: Diagnosis not present

## 2022-01-17 DIAGNOSIS — G4733 Obstructive sleep apnea (adult) (pediatric): Secondary | ICD-10-CM | POA: Diagnosis not present

## 2022-01-17 DIAGNOSIS — I5042 Chronic combined systolic (congestive) and diastolic (congestive) heart failure: Secondary | ICD-10-CM | POA: Diagnosis not present

## 2022-01-17 DIAGNOSIS — I110 Hypertensive heart disease with heart failure: Secondary | ICD-10-CM | POA: Diagnosis not present

## 2022-01-18 DIAGNOSIS — I4821 Permanent atrial fibrillation: Secondary | ICD-10-CM | POA: Diagnosis not present

## 2022-01-18 DIAGNOSIS — I10 Essential (primary) hypertension: Secondary | ICD-10-CM | POA: Diagnosis not present

## 2022-01-18 DIAGNOSIS — I5042 Chronic combined systolic (congestive) and diastolic (congestive) heart failure: Secondary | ICD-10-CM | POA: Diagnosis not present

## 2022-01-23 DIAGNOSIS — N401 Enlarged prostate with lower urinary tract symptoms: Secondary | ICD-10-CM | POA: Diagnosis not present

## 2022-01-23 DIAGNOSIS — G4733 Obstructive sleep apnea (adult) (pediatric): Secondary | ICD-10-CM | POA: Diagnosis not present

## 2022-01-23 DIAGNOSIS — I5042 Chronic combined systolic (congestive) and diastolic (congestive) heart failure: Secondary | ICD-10-CM | POA: Diagnosis not present

## 2022-01-23 DIAGNOSIS — M86171 Other acute osteomyelitis, right ankle and foot: Secondary | ICD-10-CM | POA: Diagnosis not present

## 2022-01-23 DIAGNOSIS — I110 Hypertensive heart disease with heart failure: Secondary | ICD-10-CM | POA: Diagnosis not present

## 2022-01-23 DIAGNOSIS — I482 Chronic atrial fibrillation, unspecified: Secondary | ICD-10-CM | POA: Diagnosis not present

## 2022-01-26 DIAGNOSIS — I110 Hypertensive heart disease with heart failure: Secondary | ICD-10-CM | POA: Diagnosis not present

## 2022-01-26 DIAGNOSIS — I5042 Chronic combined systolic (congestive) and diastolic (congestive) heart failure: Secondary | ICD-10-CM | POA: Diagnosis not present

## 2022-01-26 DIAGNOSIS — G4733 Obstructive sleep apnea (adult) (pediatric): Secondary | ICD-10-CM | POA: Diagnosis not present

## 2022-01-26 DIAGNOSIS — I482 Chronic atrial fibrillation, unspecified: Secondary | ICD-10-CM | POA: Diagnosis not present

## 2022-01-26 DIAGNOSIS — N401 Enlarged prostate with lower urinary tract symptoms: Secondary | ICD-10-CM | POA: Diagnosis not present

## 2022-01-26 DIAGNOSIS — M86171 Other acute osteomyelitis, right ankle and foot: Secondary | ICD-10-CM | POA: Diagnosis not present

## 2022-01-29 DIAGNOSIS — I110 Hypertensive heart disease with heart failure: Secondary | ICD-10-CM | POA: Diagnosis not present

## 2022-01-29 DIAGNOSIS — I5042 Chronic combined systolic (congestive) and diastolic (congestive) heart failure: Secondary | ICD-10-CM | POA: Diagnosis not present

## 2022-01-29 DIAGNOSIS — M86171 Other acute osteomyelitis, right ankle and foot: Secondary | ICD-10-CM | POA: Diagnosis not present

## 2022-01-29 DIAGNOSIS — N401 Enlarged prostate with lower urinary tract symptoms: Secondary | ICD-10-CM | POA: Diagnosis not present

## 2022-01-29 DIAGNOSIS — I482 Chronic atrial fibrillation, unspecified: Secondary | ICD-10-CM | POA: Diagnosis not present

## 2022-01-29 DIAGNOSIS — G4733 Obstructive sleep apnea (adult) (pediatric): Secondary | ICD-10-CM | POA: Diagnosis not present

## 2022-01-30 DIAGNOSIS — G4733 Obstructive sleep apnea (adult) (pediatric): Secondary | ICD-10-CM | POA: Diagnosis not present

## 2022-01-30 DIAGNOSIS — I110 Hypertensive heart disease with heart failure: Secondary | ICD-10-CM | POA: Diagnosis not present

## 2022-01-30 DIAGNOSIS — M86171 Other acute osteomyelitis, right ankle and foot: Secondary | ICD-10-CM | POA: Diagnosis not present

## 2022-01-30 DIAGNOSIS — I482 Chronic atrial fibrillation, unspecified: Secondary | ICD-10-CM | POA: Diagnosis not present

## 2022-01-30 DIAGNOSIS — N401 Enlarged prostate with lower urinary tract symptoms: Secondary | ICD-10-CM | POA: Diagnosis not present

## 2022-01-30 DIAGNOSIS — I5042 Chronic combined systolic (congestive) and diastolic (congestive) heart failure: Secondary | ICD-10-CM | POA: Diagnosis not present

## 2022-01-31 DIAGNOSIS — I482 Chronic atrial fibrillation, unspecified: Secondary | ICD-10-CM | POA: Diagnosis not present

## 2022-01-31 DIAGNOSIS — I5042 Chronic combined systolic (congestive) and diastolic (congestive) heart failure: Secondary | ICD-10-CM | POA: Diagnosis not present

## 2022-01-31 DIAGNOSIS — N401 Enlarged prostate with lower urinary tract symptoms: Secondary | ICD-10-CM | POA: Diagnosis not present

## 2022-01-31 DIAGNOSIS — M86171 Other acute osteomyelitis, right ankle and foot: Secondary | ICD-10-CM | POA: Diagnosis not present

## 2022-01-31 DIAGNOSIS — G4733 Obstructive sleep apnea (adult) (pediatric): Secondary | ICD-10-CM | POA: Diagnosis not present

## 2022-01-31 DIAGNOSIS — I110 Hypertensive heart disease with heart failure: Secondary | ICD-10-CM | POA: Diagnosis not present

## 2022-02-08 DIAGNOSIS — I482 Chronic atrial fibrillation, unspecified: Secondary | ICD-10-CM | POA: Diagnosis not present

## 2022-02-08 DIAGNOSIS — I5042 Chronic combined systolic (congestive) and diastolic (congestive) heart failure: Secondary | ICD-10-CM | POA: Diagnosis not present

## 2022-02-08 DIAGNOSIS — N401 Enlarged prostate with lower urinary tract symptoms: Secondary | ICD-10-CM | POA: Diagnosis not present

## 2022-02-08 DIAGNOSIS — G4733 Obstructive sleep apnea (adult) (pediatric): Secondary | ICD-10-CM | POA: Diagnosis not present

## 2022-02-08 DIAGNOSIS — I110 Hypertensive heart disease with heart failure: Secondary | ICD-10-CM | POA: Diagnosis not present

## 2022-02-08 DIAGNOSIS — M86171 Other acute osteomyelitis, right ankle and foot: Secondary | ICD-10-CM | POA: Diagnosis not present

## 2022-02-13 NOTE — Progress Notes (Signed)
NEUROLOGY FOLLOW UP OFFICE NOTE  Timothy Macias [id number]  Assessment/Plan:   1.  Severe idiopathic sensorimotor axonal peripheral neuropathy 2.  Lumbar spinal stenosis 3.  Bilateral foot drop secondary to polyneuropathy vs L5 spinal stenosis 4.  Diplopia secondary to decompensated exodeviation   1.  Continue current management 2.  Follow up as needed.      Subjective:  Timothy Macias is a 61 year old man with atrial fibrillation, on anticoagulation, who follows up for peripheral neuropathy.   UPDATE: Current medications:  Cymbalta 60mg  BID, gabapentin 100mg  QHS, oxycodone   He was in the hospital and SNF last year.  He had acute renal failure and required being intubated.  He also had a hernia rupture that required surgery.  He now sometimes has bowel incontinence.  Still feels dizzy from time to time.  After hernia repair, he has had left upper extremity weakness and numbness in the   HISTORY: Since January 2020, he started experiencing "dizziness" and balance issues which has gradually progressed.  He states he is stumbling.  He feels off balance.  He reports lack of coordination.  When he walks, his feet "don't go where my brain tells them".  He feels numbness and tingling in his feet and has trouble feeling the ground when walking.  He has had several falls.  He also reports low back pain as well as pain in the legs and feet.  He also reports numbness in the hands and arms as well.  The pain is stabbing and burning.  Arms and legs feel weak  He also reports dizziness.  When he stands up, he feels dizzy described as the room moving.  It lasts about a few minutes.  He also reports fairly constant horizontal double vision.  It resolves when covering either eye.  Severity fluctuates, worse later in the day.  Sometimes he slurs his speech.  Sometimes trouble swallowing but nothing significant.  Hearing is fine.  No tinnitus.  No significant facial numbness.   He was started on  gabapentin for the pain.  He has been on Cymbalta off and on for a couple of years for depression and arthritic pain.  He has neck pain. No changes in bowel or bladder function.  No known diabetes.  He has not seen the eye doctor for the double vision.    Past medications:  Lyrica 200mg  BID     06/04/19  CT of the head without contrast:  negative. 2020 LABS:  B12 592, TSH 1.760, Hgb A1c 6.1, ANA negative, sed rate 33, SPEP/IFE negative for monoclonal protein, B6 4.2, AChR binding antibodies and striated muscle antibodies negative. 07/02/19 MRI Brain wo:  Multiple punctate T2 and FLAIR signal within the cerebral hemispheric and subcortical white matter, likely small vessel disease.  My suspicion for demyelinating disease is low. 07/07/19:  NCV-EMG demonstrated severe active on chronic sensorimotor axonal polyneuropathy, worse on left.  To rule out possible superimposed multilevel radiculopathy, MRI lumbar spine performed on 07/29/19, which showed compressive spinal stenosis from T12-L1 to L3-4 with moderate spinal stenosis at L4-5.  He was evaluated by neurosurgery who felt symptoms more likely related to polyneuropathy and that given severity of polyneuropathy, surgery would not be beneficial anyway.   07/24/19 Ophthalmology (Dr. Alben Spittle):  Exam demonstrated decompensated exodeviation. He was referred to Dr. Allena Katz, the eye specialist.  There was not much that can be done except surgery.  He deferred.  PAST MEDICAL HISTORY: Past Medical History:  Diagnosis Date  Atrial fibrillation (HCC)    Fatty liver    Hypertension    Sleep apnea    wears CPAP    MEDICATIONS: Current Outpatient Medications on File Prior to Visit  Medication Sig Dispense Refill   apixaban (ELIQUIS) 5 MG TABS tablet Take 5 mg by mouth daily.     Cholecalciferol (VITAMIN D3) 25 MCG (1000 UT) CAPS Take 1 capsule by mouth daily.     Coenzyme Q10 10 MG capsule Take 1 capsule by mouth daily.     cyclobenzaprine (FLEXERIL) 10 MG tablet  Take 20 mg by mouth at bedtime.     digoxin (LANOXIN) 0.125 MG tablet 0.125 mg.      diphenhydramine-acetaminophen (TYLENOL PM) 25-500 MG TABS tablet Take 2 tablets by mouth at bedtime as needed.     dofetilide (TIKOSYN) 500 MCG capsule Take by mouth.     DULoxetine (CYMBALTA) 60 MG capsule Take 60 mg by mouth 2 (two) times daily.     furosemide (LASIX) 80 MG tablet Take 80 mg by mouth daily.  (Patient not taking: No sig reported)     gabapentin (NEURONTIN) 300 MG capsule at bedtime.  (Patient not taking: No sig reported)     lisinopril (ZESTRIL) 5 MG tablet Take 2.5 mg by mouth daily.     LORazepam (ATIVAN) 0.5 MG tablet Take 0.5 mg by mouth 3 (three) times daily.     melatonin 5 MG TABS Take 2-3 tablets by mouth at bedtime as needed.     metoprolol succinate (TOPROL-XL) 100 MG 24 hr tablet Take 150 mg by mouth 2 (two) times daily.     Multiple Vitamins-Minerals (YOUR LIFE MULTI ADULT GUMMIES) CHEW Chew by mouth.     Oxycodone HCl 20 MG TABS Take 3 tablets by mouth daily. Takes 3 tablets 3 times a day     pantoprazole (PROTONIX) 40 MG tablet Take 1 tablet by mouth 2 (two) times daily.     potassium chloride SA (K-DUR) 20 MEQ tablet Take 2 tablets by mouth daily.     predniSONE (DELTASONE) 20 MG tablet Take 10 mg by mouth daily.  (Patient not taking: No sig reported)     pregabalin (LYRICA) 200 MG capsule Take 200 mg by mouth 2 (two) times daily.     promethazine (PHENERGAN) 25 MG tablet Take 25 mg by mouth every 4 (four) hours as needed. (Patient not taking: No sig reported)     SUMAtriptan (IMITREX) 100 MG tablet Take 100 mg by mouth as directed.     tamsulosin (FLOMAX) 0.4 MG CAPS capsule Take 0.4 mg by mouth daily.     testosterone cypionate (DEPOTESTOSTERONE CYPIONATE) 200 MG/ML injection INJECT ONE ML INTRAMUSCULARLY TWICE A WEEK FOR LOW TESTOSTERONE.     torsemide (DEMADEX) 100 MG tablet Take 100 mg by mouth 2 (two) times daily.     No current facility-administered medications on file  prior to visit.    ALLERGIES: Allergies  Allergen Reactions   Albuterol Swelling   Amiodarone Rash   Penicillins Hives    FAMILY HISTORY: Family History  Problem Relation Age of Onset   Bone cancer Mother    Other Father        MVA      Objective:  Blood pressure 111/74, pulse 94, resp. rate 18, height 5' 7.5" (1.715 m), weight (!) 306 lb (138.8 kg), SpO2 96 %. General: No acute distress.  Patient appears well-groomed.   Head:  Normocephalic/atraumatic Eyes:  Fundi examined but not visualized Neurological  Exam: alert and oriented to person, place, and time. Speech fluent and not dysarthric, language intact.  CN II-XII intact. Bulk and tone normal, muscle strength 4/5 left deltoid and L > R finger interossei, bilateral foot drop, otherwise 5/5 throughout except for bilateral foot drop.  Sensation to pinprick reduced in all extremities up to above elbows and knees. Notes asymmetric pinprick loss in the 4th and 5th digits of left hand. Reduced vibratory sensation up to knees.  Deep tendon reflexes absent throughout, toes downgoing.  Finger to nose testing intact.  In wheelchair.  Gait testing deferred.   Shon Millet, DO  CC: Donzetta Sprung, MD

## 2022-02-15 ENCOUNTER — Encounter: Payer: Self-pay | Admitting: Neurology

## 2022-02-15 ENCOUNTER — Other Ambulatory Visit: Payer: Self-pay

## 2022-02-15 ENCOUNTER — Ambulatory Visit (INDEPENDENT_AMBULATORY_CARE_PROVIDER_SITE_OTHER): Payer: Commercial Managed Care - PPO | Admitting: Neurology

## 2022-02-15 VITALS — BP 111/74 | HR 94 | Resp 18 | Ht 67.5 in | Wt 306.0 lb

## 2022-02-15 DIAGNOSIS — G609 Hereditary and idiopathic neuropathy, unspecified: Secondary | ICD-10-CM | POA: Diagnosis not present

## 2022-02-15 DIAGNOSIS — M48061 Spinal stenosis, lumbar region without neurogenic claudication: Secondary | ICD-10-CM

## 2022-04-10 DIAGNOSIS — Z87891 Personal history of nicotine dependence: Secondary | ICD-10-CM | POA: Diagnosis not present

## 2022-04-10 DIAGNOSIS — I440 Atrioventricular block, first degree: Secondary | ICD-10-CM | POA: Diagnosis not present

## 2022-04-10 DIAGNOSIS — I5042 Chronic combined systolic (congestive) and diastolic (congestive) heart failure: Secondary | ICD-10-CM | POA: Diagnosis not present

## 2022-04-10 DIAGNOSIS — R000 Tachycardia, unspecified: Secondary | ICD-10-CM | POA: Diagnosis not present

## 2022-04-10 DIAGNOSIS — I959 Hypotension, unspecified: Secondary | ICD-10-CM | POA: Diagnosis not present

## 2022-04-10 DIAGNOSIS — G894 Chronic pain syndrome: Secondary | ICD-10-CM | POA: Diagnosis not present

## 2022-04-10 DIAGNOSIS — I110 Hypertensive heart disease with heart failure: Secondary | ICD-10-CM | POA: Diagnosis not present

## 2022-04-10 DIAGNOSIS — Z96641 Presence of right artificial hip joint: Secondary | ICD-10-CM | POA: Diagnosis not present

## 2022-04-10 DIAGNOSIS — R002 Palpitations: Secondary | ICD-10-CM | POA: Diagnosis not present

## 2022-04-10 DIAGNOSIS — G4733 Obstructive sleep apnea (adult) (pediatric): Secondary | ICD-10-CM | POA: Diagnosis not present

## 2022-04-10 DIAGNOSIS — I420 Dilated cardiomyopathy: Secondary | ICD-10-CM | POA: Diagnosis not present

## 2022-04-10 DIAGNOSIS — Z20822 Contact with and (suspected) exposure to covid-19: Secondary | ICD-10-CM | POA: Diagnosis not present

## 2022-04-10 DIAGNOSIS — M549 Dorsalgia, unspecified: Secondary | ICD-10-CM | POA: Diagnosis not present

## 2022-04-10 DIAGNOSIS — Z9989 Dependence on other enabling machines and devices: Secondary | ICD-10-CM | POA: Diagnosis not present

## 2022-04-10 DIAGNOSIS — I4510 Unspecified right bundle-branch block: Secondary | ICD-10-CM | POA: Diagnosis not present

## 2022-04-10 DIAGNOSIS — R9431 Abnormal electrocardiogram [ECG] [EKG]: Secondary | ICD-10-CM | POA: Diagnosis not present

## 2022-04-10 DIAGNOSIS — G629 Polyneuropathy, unspecified: Secondary | ICD-10-CM | POA: Diagnosis not present

## 2022-04-10 DIAGNOSIS — Z79899 Other long term (current) drug therapy: Secondary | ICD-10-CM | POA: Diagnosis not present

## 2022-04-10 DIAGNOSIS — I471 Supraventricular tachycardia: Secondary | ICD-10-CM | POA: Diagnosis not present

## 2022-04-10 DIAGNOSIS — I4821 Permanent atrial fibrillation: Secondary | ICD-10-CM | POA: Diagnosis not present

## 2022-04-10 DIAGNOSIS — R531 Weakness: Secondary | ICD-10-CM | POA: Diagnosis not present

## 2022-04-10 DIAGNOSIS — Z7901 Long term (current) use of anticoagulants: Secondary | ICD-10-CM | POA: Diagnosis not present

## 2022-04-10 DIAGNOSIS — R269 Unspecified abnormalities of gait and mobility: Secondary | ICD-10-CM | POA: Diagnosis not present

## 2022-04-11 DIAGNOSIS — Z79891 Long term (current) use of opiate analgesic: Secondary | ICD-10-CM | POA: Diagnosis not present

## 2022-04-11 DIAGNOSIS — M48061 Spinal stenosis, lumbar region without neurogenic claudication: Secondary | ICD-10-CM | POA: Diagnosis present

## 2022-04-11 DIAGNOSIS — N189 Chronic kidney disease, unspecified: Secondary | ICD-10-CM | POA: Diagnosis present

## 2022-04-11 DIAGNOSIS — Z6841 Body Mass Index (BMI) 40.0 and over, adult: Secondary | ICD-10-CM | POA: Diagnosis not present

## 2022-04-11 DIAGNOSIS — R269 Unspecified abnormalities of gait and mobility: Secondary | ICD-10-CM | POA: Diagnosis not present

## 2022-04-11 DIAGNOSIS — Z7901 Long term (current) use of anticoagulants: Secondary | ICD-10-CM | POA: Diagnosis not present

## 2022-04-11 DIAGNOSIS — M545 Low back pain, unspecified: Secondary | ICD-10-CM | POA: Diagnosis present

## 2022-04-11 DIAGNOSIS — E440 Moderate protein-calorie malnutrition: Secondary | ICD-10-CM | POA: Diagnosis present

## 2022-04-11 DIAGNOSIS — I5042 Chronic combined systolic (congestive) and diastolic (congestive) heart failure: Secondary | ICD-10-CM | POA: Diagnosis not present

## 2022-04-11 DIAGNOSIS — Z79899 Other long term (current) drug therapy: Secondary | ICD-10-CM | POA: Diagnosis not present

## 2022-04-11 DIAGNOSIS — L89153 Pressure ulcer of sacral region, stage 3: Secondary | ICD-10-CM | POA: Diagnosis present

## 2022-04-11 DIAGNOSIS — Z880 Allergy status to penicillin: Secondary | ICD-10-CM | POA: Diagnosis not present

## 2022-04-11 DIAGNOSIS — R0602 Shortness of breath: Secondary | ICD-10-CM | POA: Diagnosis not present

## 2022-04-11 DIAGNOSIS — I5023 Acute on chronic systolic (congestive) heart failure: Secondary | ICD-10-CM | POA: Diagnosis present

## 2022-04-11 DIAGNOSIS — R570 Cardiogenic shock: Secondary | ICD-10-CM | POA: Diagnosis present

## 2022-04-11 DIAGNOSIS — M533 Sacrococcygeal disorders, not elsewhere classified: Secondary | ICD-10-CM | POA: Diagnosis not present

## 2022-04-11 DIAGNOSIS — I420 Dilated cardiomyopathy: Secondary | ICD-10-CM | POA: Diagnosis present

## 2022-04-11 DIAGNOSIS — G8929 Other chronic pain: Secondary | ICD-10-CM | POA: Diagnosis present

## 2022-04-11 DIAGNOSIS — I110 Hypertensive heart disease with heart failure: Secondary | ICD-10-CM | POA: Diagnosis not present

## 2022-04-11 DIAGNOSIS — I130 Hypertensive heart and chronic kidney disease with heart failure and stage 1 through stage 4 chronic kidney disease, or unspecified chronic kidney disease: Secondary | ICD-10-CM | POA: Diagnosis present

## 2022-04-11 DIAGNOSIS — E1142 Type 2 diabetes mellitus with diabetic polyneuropathy: Secondary | ICD-10-CM | POA: Diagnosis present

## 2022-04-11 DIAGNOSIS — I4891 Unspecified atrial fibrillation: Secondary | ICD-10-CM | POA: Diagnosis present

## 2022-04-11 DIAGNOSIS — I509 Heart failure, unspecified: Secondary | ICD-10-CM | POA: Diagnosis not present

## 2022-04-11 DIAGNOSIS — Z888 Allergy status to other drugs, medicaments and biological substances status: Secondary | ICD-10-CM | POA: Diagnosis not present

## 2022-04-11 DIAGNOSIS — G629 Polyneuropathy, unspecified: Secondary | ICD-10-CM | POA: Diagnosis not present

## 2022-04-11 DIAGNOSIS — I4821 Permanent atrial fibrillation: Secondary | ICD-10-CM | POA: Diagnosis present

## 2022-04-11 DIAGNOSIS — G4733 Obstructive sleep apnea (adult) (pediatric): Secondary | ICD-10-CM | POA: Diagnosis present

## 2022-04-11 DIAGNOSIS — F17210 Nicotine dependence, cigarettes, uncomplicated: Secondary | ICD-10-CM | POA: Diagnosis present

## 2022-04-11 DIAGNOSIS — N179 Acute kidney failure, unspecified: Secondary | ICD-10-CM | POA: Diagnosis present

## 2022-04-11 DIAGNOSIS — R079 Chest pain, unspecified: Secondary | ICD-10-CM | POA: Diagnosis not present

## 2022-04-11 DIAGNOSIS — D509 Iron deficiency anemia, unspecified: Secondary | ICD-10-CM | POA: Diagnosis present

## 2022-04-11 DIAGNOSIS — K2100 Gastro-esophageal reflux disease with esophagitis, without bleeding: Secondary | ICD-10-CM | POA: Diagnosis present

## 2022-06-12 DIAGNOSIS — Z79899 Other long term (current) drug therapy: Secondary | ICD-10-CM | POA: Diagnosis not present

## 2022-06-12 DIAGNOSIS — Z87891 Personal history of nicotine dependence: Secondary | ICD-10-CM | POA: Diagnosis not present

## 2022-06-12 DIAGNOSIS — L89152 Pressure ulcer of sacral region, stage 2: Secondary | ICD-10-CM | POA: Diagnosis not present

## 2022-06-12 DIAGNOSIS — G629 Polyneuropathy, unspecified: Secondary | ICD-10-CM | POA: Diagnosis not present

## 2022-06-12 DIAGNOSIS — Z880 Allergy status to penicillin: Secondary | ICD-10-CM | POA: Diagnosis not present

## 2022-06-12 DIAGNOSIS — I110 Hypertensive heart disease with heart failure: Secondary | ICD-10-CM | POA: Diagnosis not present

## 2022-06-12 DIAGNOSIS — I4891 Unspecified atrial fibrillation: Secondary | ICD-10-CM | POA: Diagnosis not present

## 2022-06-12 DIAGNOSIS — Z888 Allergy status to other drugs, medicaments and biological substances status: Secondary | ICD-10-CM | POA: Diagnosis not present

## 2022-06-12 DIAGNOSIS — Z7901 Long term (current) use of anticoagulants: Secondary | ICD-10-CM | POA: Diagnosis not present

## 2022-06-12 DIAGNOSIS — I509 Heart failure, unspecified: Secondary | ICD-10-CM | POA: Diagnosis not present

## 2022-06-12 DIAGNOSIS — F32A Depression, unspecified: Secondary | ICD-10-CM | POA: Diagnosis not present

## 2022-06-12 DIAGNOSIS — Z79891 Long term (current) use of opiate analgesic: Secondary | ICD-10-CM | POA: Diagnosis not present

## 2022-06-18 DIAGNOSIS — Z7901 Long term (current) use of anticoagulants: Secondary | ICD-10-CM | POA: Diagnosis not present

## 2022-06-18 DIAGNOSIS — I420 Dilated cardiomyopathy: Secondary | ICD-10-CM | POA: Diagnosis not present

## 2022-06-18 DIAGNOSIS — Z96641 Presence of right artificial hip joint: Secondary | ICD-10-CM | POA: Diagnosis not present

## 2022-06-18 DIAGNOSIS — I4819 Other persistent atrial fibrillation: Secondary | ICD-10-CM | POA: Diagnosis not present

## 2022-06-18 DIAGNOSIS — I5020 Unspecified systolic (congestive) heart failure: Secondary | ICD-10-CM | POA: Diagnosis not present

## 2022-06-18 DIAGNOSIS — M1990 Unspecified osteoarthritis, unspecified site: Secondary | ICD-10-CM | POA: Diagnosis not present

## 2022-06-18 DIAGNOSIS — L89322 Pressure ulcer of left buttock, stage 2: Secondary | ICD-10-CM | POA: Diagnosis not present

## 2022-06-18 DIAGNOSIS — K219 Gastro-esophageal reflux disease without esophagitis: Secondary | ICD-10-CM | POA: Diagnosis not present

## 2022-06-18 DIAGNOSIS — Z79891 Long term (current) use of opiate analgesic: Secondary | ICD-10-CM | POA: Diagnosis not present

## 2022-06-18 DIAGNOSIS — G629 Polyneuropathy, unspecified: Secondary | ICD-10-CM | POA: Diagnosis not present

## 2022-06-18 DIAGNOSIS — Z87891 Personal history of nicotine dependence: Secondary | ICD-10-CM | POA: Diagnosis not present

## 2022-06-18 DIAGNOSIS — G8929 Other chronic pain: Secondary | ICD-10-CM | POA: Diagnosis not present

## 2022-06-18 DIAGNOSIS — I110 Hypertensive heart disease with heart failure: Secondary | ICD-10-CM | POA: Diagnosis not present

## 2022-06-18 DIAGNOSIS — G4733 Obstructive sleep apnea (adult) (pediatric): Secondary | ICD-10-CM | POA: Diagnosis not present

## 2022-06-18 DIAGNOSIS — N189 Chronic kidney disease, unspecified: Secondary | ICD-10-CM | POA: Diagnosis not present

## 2022-06-18 DIAGNOSIS — F32A Depression, unspecified: Secondary | ICD-10-CM | POA: Diagnosis not present

## 2022-06-19 DIAGNOSIS — I4819 Other persistent atrial fibrillation: Secondary | ICD-10-CM | POA: Diagnosis not present

## 2022-06-19 DIAGNOSIS — L89322 Pressure ulcer of left buttock, stage 2: Secondary | ICD-10-CM | POA: Diagnosis not present

## 2022-06-19 DIAGNOSIS — N189 Chronic kidney disease, unspecified: Secondary | ICD-10-CM | POA: Diagnosis not present

## 2022-06-19 DIAGNOSIS — I5020 Unspecified systolic (congestive) heart failure: Secondary | ICD-10-CM | POA: Diagnosis not present

## 2022-06-19 DIAGNOSIS — I110 Hypertensive heart disease with heart failure: Secondary | ICD-10-CM | POA: Diagnosis not present

## 2022-06-19 DIAGNOSIS — G629 Polyneuropathy, unspecified: Secondary | ICD-10-CM | POA: Diagnosis not present

## 2022-06-26 DIAGNOSIS — I5020 Unspecified systolic (congestive) heart failure: Secondary | ICD-10-CM | POA: Diagnosis not present

## 2022-06-26 DIAGNOSIS — I110 Hypertensive heart disease with heart failure: Secondary | ICD-10-CM | POA: Diagnosis not present

## 2022-06-26 DIAGNOSIS — G629 Polyneuropathy, unspecified: Secondary | ICD-10-CM | POA: Diagnosis not present

## 2022-06-26 DIAGNOSIS — I4819 Other persistent atrial fibrillation: Secondary | ICD-10-CM | POA: Diagnosis not present

## 2022-06-26 DIAGNOSIS — L89322 Pressure ulcer of left buttock, stage 2: Secondary | ICD-10-CM | POA: Diagnosis not present

## 2022-06-26 DIAGNOSIS — N189 Chronic kidney disease, unspecified: Secondary | ICD-10-CM | POA: Diagnosis not present

## 2022-07-03 DIAGNOSIS — I110 Hypertensive heart disease with heart failure: Secondary | ICD-10-CM | POA: Diagnosis not present

## 2022-07-03 DIAGNOSIS — Z87891 Personal history of nicotine dependence: Secondary | ICD-10-CM | POA: Diagnosis not present

## 2022-07-03 DIAGNOSIS — L89152 Pressure ulcer of sacral region, stage 2: Secondary | ICD-10-CM | POA: Diagnosis not present

## 2022-07-03 DIAGNOSIS — I509 Heart failure, unspecified: Secondary | ICD-10-CM | POA: Diagnosis not present

## 2022-07-03 DIAGNOSIS — I4891 Unspecified atrial fibrillation: Secondary | ICD-10-CM | POA: Diagnosis not present

## 2022-07-05 DIAGNOSIS — N189 Chronic kidney disease, unspecified: Secondary | ICD-10-CM | POA: Diagnosis not present

## 2022-07-05 DIAGNOSIS — L89322 Pressure ulcer of left buttock, stage 2: Secondary | ICD-10-CM | POA: Diagnosis not present

## 2022-07-05 DIAGNOSIS — I5020 Unspecified systolic (congestive) heart failure: Secondary | ICD-10-CM | POA: Diagnosis not present

## 2022-07-05 DIAGNOSIS — G629 Polyneuropathy, unspecified: Secondary | ICD-10-CM | POA: Diagnosis not present

## 2022-07-05 DIAGNOSIS — I4819 Other persistent atrial fibrillation: Secondary | ICD-10-CM | POA: Diagnosis not present

## 2022-07-05 DIAGNOSIS — I110 Hypertensive heart disease with heart failure: Secondary | ICD-10-CM | POA: Diagnosis not present

## 2022-07-09 DIAGNOSIS — G629 Polyneuropathy, unspecified: Secondary | ICD-10-CM | POA: Diagnosis not present

## 2022-07-09 DIAGNOSIS — N189 Chronic kidney disease, unspecified: Secondary | ICD-10-CM | POA: Diagnosis not present

## 2022-07-09 DIAGNOSIS — I5020 Unspecified systolic (congestive) heart failure: Secondary | ICD-10-CM | POA: Diagnosis not present

## 2022-07-09 DIAGNOSIS — I4819 Other persistent atrial fibrillation: Secondary | ICD-10-CM | POA: Diagnosis not present

## 2022-07-09 DIAGNOSIS — I110 Hypertensive heart disease with heart failure: Secondary | ICD-10-CM | POA: Diagnosis not present

## 2022-07-09 DIAGNOSIS — L89322 Pressure ulcer of left buttock, stage 2: Secondary | ICD-10-CM | POA: Diagnosis not present

## 2022-07-16 DIAGNOSIS — I4819 Other persistent atrial fibrillation: Secondary | ICD-10-CM | POA: Diagnosis not present

## 2022-07-16 DIAGNOSIS — I5020 Unspecified systolic (congestive) heart failure: Secondary | ICD-10-CM | POA: Diagnosis not present

## 2022-07-16 DIAGNOSIS — N189 Chronic kidney disease, unspecified: Secondary | ICD-10-CM | POA: Diagnosis not present

## 2022-07-16 DIAGNOSIS — G629 Polyneuropathy, unspecified: Secondary | ICD-10-CM | POA: Diagnosis not present

## 2022-07-16 DIAGNOSIS — L89322 Pressure ulcer of left buttock, stage 2: Secondary | ICD-10-CM | POA: Diagnosis not present

## 2022-07-16 DIAGNOSIS — I110 Hypertensive heart disease with heart failure: Secondary | ICD-10-CM | POA: Diagnosis not present

## 2022-07-18 DIAGNOSIS — K219 Gastro-esophageal reflux disease without esophagitis: Secondary | ICD-10-CM | POA: Diagnosis not present

## 2022-07-18 DIAGNOSIS — M1990 Unspecified osteoarthritis, unspecified site: Secondary | ICD-10-CM | POA: Diagnosis not present

## 2022-07-18 DIAGNOSIS — G629 Polyneuropathy, unspecified: Secondary | ICD-10-CM | POA: Diagnosis not present

## 2022-07-18 DIAGNOSIS — I5020 Unspecified systolic (congestive) heart failure: Secondary | ICD-10-CM | POA: Diagnosis not present

## 2022-07-18 DIAGNOSIS — G4733 Obstructive sleep apnea (adult) (pediatric): Secondary | ICD-10-CM | POA: Diagnosis not present

## 2022-07-18 DIAGNOSIS — L89322 Pressure ulcer of left buttock, stage 2: Secondary | ICD-10-CM | POA: Diagnosis not present

## 2022-07-18 DIAGNOSIS — G8929 Other chronic pain: Secondary | ICD-10-CM | POA: Diagnosis not present

## 2022-07-18 DIAGNOSIS — Z79891 Long term (current) use of opiate analgesic: Secondary | ICD-10-CM | POA: Diagnosis not present

## 2022-07-18 DIAGNOSIS — Z87891 Personal history of nicotine dependence: Secondary | ICD-10-CM | POA: Diagnosis not present

## 2022-07-18 DIAGNOSIS — I110 Hypertensive heart disease with heart failure: Secondary | ICD-10-CM | POA: Diagnosis not present

## 2022-07-18 DIAGNOSIS — I420 Dilated cardiomyopathy: Secondary | ICD-10-CM | POA: Diagnosis not present

## 2022-07-18 DIAGNOSIS — Z7901 Long term (current) use of anticoagulants: Secondary | ICD-10-CM | POA: Diagnosis not present

## 2022-07-18 DIAGNOSIS — F32A Depression, unspecified: Secondary | ICD-10-CM | POA: Diagnosis not present

## 2022-07-18 DIAGNOSIS — I4819 Other persistent atrial fibrillation: Secondary | ICD-10-CM | POA: Diagnosis not present

## 2022-07-18 DIAGNOSIS — Z96641 Presence of right artificial hip joint: Secondary | ICD-10-CM | POA: Diagnosis not present

## 2022-07-18 DIAGNOSIS — N189 Chronic kidney disease, unspecified: Secondary | ICD-10-CM | POA: Diagnosis not present

## 2022-07-26 DIAGNOSIS — I110 Hypertensive heart disease with heart failure: Secondary | ICD-10-CM | POA: Diagnosis not present

## 2022-07-26 DIAGNOSIS — N189 Chronic kidney disease, unspecified: Secondary | ICD-10-CM | POA: Diagnosis not present

## 2022-07-26 DIAGNOSIS — I5020 Unspecified systolic (congestive) heart failure: Secondary | ICD-10-CM | POA: Diagnosis not present

## 2022-07-26 DIAGNOSIS — I4819 Other persistent atrial fibrillation: Secondary | ICD-10-CM | POA: Diagnosis not present

## 2022-07-26 DIAGNOSIS — L89322 Pressure ulcer of left buttock, stage 2: Secondary | ICD-10-CM | POA: Diagnosis not present

## 2022-07-26 DIAGNOSIS — G629 Polyneuropathy, unspecified: Secondary | ICD-10-CM | POA: Diagnosis not present

## 2022-08-01 DIAGNOSIS — I5020 Unspecified systolic (congestive) heart failure: Secondary | ICD-10-CM | POA: Diagnosis not present

## 2022-08-01 DIAGNOSIS — N189 Chronic kidney disease, unspecified: Secondary | ICD-10-CM | POA: Diagnosis not present

## 2022-08-01 DIAGNOSIS — I4819 Other persistent atrial fibrillation: Secondary | ICD-10-CM | POA: Diagnosis not present

## 2022-08-01 DIAGNOSIS — L89322 Pressure ulcer of left buttock, stage 2: Secondary | ICD-10-CM | POA: Diagnosis not present

## 2022-08-01 DIAGNOSIS — G629 Polyneuropathy, unspecified: Secondary | ICD-10-CM | POA: Diagnosis not present

## 2022-08-01 DIAGNOSIS — I110 Hypertensive heart disease with heart failure: Secondary | ICD-10-CM | POA: Diagnosis not present

## 2022-08-07 DIAGNOSIS — Z87891 Personal history of nicotine dependence: Secondary | ICD-10-CM | POA: Diagnosis not present

## 2022-08-07 DIAGNOSIS — Z79899 Other long term (current) drug therapy: Secondary | ICD-10-CM | POA: Diagnosis not present

## 2022-08-07 DIAGNOSIS — Z880 Allergy status to penicillin: Secondary | ICD-10-CM | POA: Diagnosis not present

## 2022-08-07 DIAGNOSIS — I4891 Unspecified atrial fibrillation: Secondary | ICD-10-CM | POA: Diagnosis not present

## 2022-08-07 DIAGNOSIS — K2100 Gastro-esophageal reflux disease with esophagitis, without bleeding: Secondary | ICD-10-CM | POA: Diagnosis not present

## 2022-08-07 DIAGNOSIS — I509 Heart failure, unspecified: Secondary | ICD-10-CM | POA: Diagnosis not present

## 2022-08-07 DIAGNOSIS — I110 Hypertensive heart disease with heart failure: Secondary | ICD-10-CM | POA: Diagnosis not present

## 2022-08-07 DIAGNOSIS — L89152 Pressure ulcer of sacral region, stage 2: Secondary | ICD-10-CM | POA: Diagnosis not present

## 2022-08-07 DIAGNOSIS — Z7901 Long term (current) use of anticoagulants: Secondary | ICD-10-CM | POA: Diagnosis not present

## 2022-08-15 DIAGNOSIS — I4819 Other persistent atrial fibrillation: Secondary | ICD-10-CM | POA: Diagnosis not present

## 2022-08-15 DIAGNOSIS — L89322 Pressure ulcer of left buttock, stage 2: Secondary | ICD-10-CM | POA: Diagnosis not present

## 2022-08-15 DIAGNOSIS — I110 Hypertensive heart disease with heart failure: Secondary | ICD-10-CM | POA: Diagnosis not present

## 2022-08-15 DIAGNOSIS — N189 Chronic kidney disease, unspecified: Secondary | ICD-10-CM | POA: Diagnosis not present

## 2022-08-15 DIAGNOSIS — G629 Polyneuropathy, unspecified: Secondary | ICD-10-CM | POA: Diagnosis not present

## 2022-08-15 DIAGNOSIS — I5020 Unspecified systolic (congestive) heart failure: Secondary | ICD-10-CM | POA: Diagnosis not present

## 2022-08-21 DIAGNOSIS — N281 Cyst of kidney, acquired: Secondary | ICD-10-CM | POA: Diagnosis not present

## 2022-08-21 DIAGNOSIS — N390 Urinary tract infection, site not specified: Secondary | ICD-10-CM | POA: Diagnosis not present

## 2022-08-21 DIAGNOSIS — N200 Calculus of kidney: Secondary | ICD-10-CM | POA: Diagnosis not present

## 2022-09-03 DIAGNOSIS — R8279 Other abnormal findings on microbiological examination of urine: Secondary | ICD-10-CM | POA: Diagnosis not present

## 2022-09-03 DIAGNOSIS — Z87440 Personal history of urinary (tract) infections: Secondary | ICD-10-CM | POA: Diagnosis not present

## 2022-09-03 DIAGNOSIS — N200 Calculus of kidney: Secondary | ICD-10-CM | POA: Diagnosis not present

## 2022-09-04 DIAGNOSIS — I110 Hypertensive heart disease with heart failure: Secondary | ICD-10-CM | POA: Diagnosis not present

## 2022-09-04 DIAGNOSIS — I509 Heart failure, unspecified: Secondary | ICD-10-CM | POA: Diagnosis not present

## 2022-09-04 DIAGNOSIS — L89152 Pressure ulcer of sacral region, stage 2: Secondary | ICD-10-CM | POA: Diagnosis not present

## 2022-09-04 DIAGNOSIS — Z880 Allergy status to penicillin: Secondary | ICD-10-CM | POA: Diagnosis not present

## 2022-09-04 DIAGNOSIS — Z87891 Personal history of nicotine dependence: Secondary | ICD-10-CM | POA: Diagnosis not present

## 2022-09-04 DIAGNOSIS — Z7901 Long term (current) use of anticoagulants: Secondary | ICD-10-CM | POA: Diagnosis not present

## 2022-12-25 DIAGNOSIS — I10 Essential (primary) hypertension: Secondary | ICD-10-CM | POA: Diagnosis not present

## 2022-12-25 DIAGNOSIS — R059 Cough, unspecified: Secondary | ICD-10-CM | POA: Diagnosis not present

## 2022-12-25 DIAGNOSIS — F5221 Male erectile disorder: Secondary | ICD-10-CM | POA: Diagnosis not present

## 2022-12-25 DIAGNOSIS — Z79891 Long term (current) use of opiate analgesic: Secondary | ICD-10-CM | POA: Diagnosis not present

## 2022-12-25 DIAGNOSIS — F331 Major depressive disorder, recurrent, moderate: Secondary | ICD-10-CM | POA: Diagnosis not present

## 2022-12-25 DIAGNOSIS — F17210 Nicotine dependence, cigarettes, uncomplicated: Secondary | ICD-10-CM | POA: Diagnosis not present

## 2022-12-25 DIAGNOSIS — K2100 Gastro-esophageal reflux disease with esophagitis, without bleeding: Secondary | ICD-10-CM | POA: Diagnosis not present

## 2022-12-25 DIAGNOSIS — Z6841 Body Mass Index (BMI) 40.0 and over, adult: Secondary | ICD-10-CM | POA: Diagnosis not present

## 2022-12-25 DIAGNOSIS — I5032 Chronic diastolic (congestive) heart failure: Secondary | ICD-10-CM | POA: Diagnosis not present

## 2022-12-25 DIAGNOSIS — L719 Rosacea, unspecified: Secondary | ICD-10-CM | POA: Diagnosis not present

## 2022-12-25 DIAGNOSIS — R4582 Worries: Secondary | ICD-10-CM | POA: Diagnosis not present

## 2022-12-25 DIAGNOSIS — I480 Paroxysmal atrial fibrillation: Secondary | ICD-10-CM | POA: Diagnosis not present

## 2022-12-28 ENCOUNTER — Other Ambulatory Visit: Payer: Self-pay

## 2022-12-28 ENCOUNTER — Emergency Department (HOSPITAL_COMMUNITY): Payer: BC Managed Care – PPO

## 2022-12-28 ENCOUNTER — Encounter (HOSPITAL_COMMUNITY): Payer: Self-pay | Admitting: Emergency Medicine

## 2022-12-28 ENCOUNTER — Inpatient Hospital Stay (HOSPITAL_COMMUNITY)
Admission: EM | Admit: 2022-12-28 | Discharge: 2023-01-08 | DRG: 264 | Disposition: A | Discharge status: Rehabilitation Facility | Payer: BC Managed Care – PPO | Attending: Internal Medicine | Admitting: Internal Medicine

## 2022-12-28 DIAGNOSIS — E871 Hypo-osmolality and hyponatremia: Secondary | ICD-10-CM | POA: Diagnosis not present

## 2022-12-28 DIAGNOSIS — I480 Paroxysmal atrial fibrillation: Principal | ICD-10-CM

## 2022-12-28 DIAGNOSIS — N179 Acute kidney failure, unspecified: Secondary | ICD-10-CM | POA: Diagnosis not present

## 2022-12-28 DIAGNOSIS — I130 Hypertensive heart and chronic kidney disease with heart failure and stage 1 through stage 4 chronic kidney disease, or unspecified chronic kidney disease: Secondary | ICD-10-CM | POA: Diagnosis present

## 2022-12-28 DIAGNOSIS — G894 Chronic pain syndrome: Secondary | ICD-10-CM | POA: Diagnosis present

## 2022-12-28 DIAGNOSIS — R609 Edema, unspecified: Secondary | ICD-10-CM | POA: Diagnosis not present

## 2022-12-28 DIAGNOSIS — J9622 Acute and chronic respiratory failure with hypercapnia: Secondary | ICD-10-CM | POA: Diagnosis present

## 2022-12-28 DIAGNOSIS — N1832 Chronic kidney disease, stage 3b: Secondary | ICD-10-CM | POA: Diagnosis present

## 2022-12-28 DIAGNOSIS — J9621 Acute and chronic respiratory failure with hypoxia: Secondary | ICD-10-CM | POA: Diagnosis present

## 2022-12-28 DIAGNOSIS — F5221 Male erectile disorder: Secondary | ICD-10-CM | POA: Diagnosis not present

## 2022-12-28 DIAGNOSIS — K219 Gastro-esophageal reflux disease without esophagitis: Secondary | ICD-10-CM | POA: Diagnosis present

## 2022-12-28 DIAGNOSIS — G4733 Obstructive sleep apnea (adult) (pediatric): Secondary | ICD-10-CM | POA: Diagnosis not present

## 2022-12-28 DIAGNOSIS — Z888 Allergy status to other drugs, medicaments and biological substances status: Secondary | ICD-10-CM

## 2022-12-28 DIAGNOSIS — K7581 Nonalcoholic steatohepatitis (NASH): Secondary | ICD-10-CM | POA: Diagnosis not present

## 2022-12-28 DIAGNOSIS — K760 Fatty (change of) liver, not elsewhere classified: Secondary | ICD-10-CM | POA: Diagnosis not present

## 2022-12-28 DIAGNOSIS — I428 Other cardiomyopathies: Secondary | ICD-10-CM | POA: Diagnosis present

## 2022-12-28 DIAGNOSIS — Z6841 Body Mass Index (BMI) 40.0 and over, adult: Secondary | ICD-10-CM | POA: Diagnosis not present

## 2022-12-28 DIAGNOSIS — K3189 Other diseases of stomach and duodenum: Secondary | ICD-10-CM | POA: Diagnosis not present

## 2022-12-28 DIAGNOSIS — K565 Intestinal adhesions [bands], unspecified as to partial versus complete obstruction: Secondary | ICD-10-CM | POA: Diagnosis not present

## 2022-12-28 DIAGNOSIS — R3915 Urgency of urination: Secondary | ICD-10-CM | POA: Diagnosis not present

## 2022-12-28 DIAGNOSIS — J810 Acute pulmonary edema: Secondary | ICD-10-CM | POA: Diagnosis not present

## 2022-12-28 DIAGNOSIS — K2100 Gastro-esophageal reflux disease with esophagitis, without bleeding: Secondary | ICD-10-CM | POA: Diagnosis not present

## 2022-12-28 DIAGNOSIS — E876 Hypokalemia: Secondary | ICD-10-CM | POA: Diagnosis not present

## 2022-12-28 DIAGNOSIS — I5042 Chronic combined systolic (congestive) and diastolic (congestive) heart failure: Secondary | ICD-10-CM | POA: Diagnosis present

## 2022-12-28 DIAGNOSIS — I482 Chronic atrial fibrillation, unspecified: Secondary | ICD-10-CM | POA: Diagnosis not present

## 2022-12-28 DIAGNOSIS — K559 Vascular disorder of intestine, unspecified: Secondary | ICD-10-CM | POA: Diagnosis not present

## 2022-12-28 DIAGNOSIS — M21372 Foot drop, left foot: Secondary | ICD-10-CM | POA: Diagnosis not present

## 2022-12-28 DIAGNOSIS — R079 Chest pain, unspecified: Secondary | ICD-10-CM | POA: Diagnosis not present

## 2022-12-28 DIAGNOSIS — I493 Ventricular premature depolarization: Secondary | ICD-10-CM | POA: Diagnosis not present

## 2022-12-28 DIAGNOSIS — I5032 Chronic diastolic (congestive) heart failure: Secondary | ICD-10-CM | POA: Diagnosis not present

## 2022-12-28 DIAGNOSIS — Z79891 Long term (current) use of opiate analgesic: Secondary | ICD-10-CM | POA: Diagnosis not present

## 2022-12-28 DIAGNOSIS — E662 Morbid (severe) obesity with alveolar hypoventilation: Secondary | ICD-10-CM | POA: Diagnosis present

## 2022-12-28 DIAGNOSIS — K55029 Acute infarction of small intestine, extent unspecified: Secondary | ICD-10-CM | POA: Diagnosis not present

## 2022-12-28 DIAGNOSIS — I509 Heart failure, unspecified: Secondary | ICD-10-CM

## 2022-12-28 DIAGNOSIS — I4891 Unspecified atrial fibrillation: Secondary | ICD-10-CM | POA: Diagnosis not present

## 2022-12-28 DIAGNOSIS — J9601 Acute respiratory failure with hypoxia: Secondary | ICD-10-CM | POA: Diagnosis not present

## 2022-12-28 DIAGNOSIS — R0689 Other abnormalities of breathing: Secondary | ICD-10-CM | POA: Diagnosis not present

## 2022-12-28 DIAGNOSIS — R4182 Altered mental status, unspecified: Secondary | ICD-10-CM | POA: Diagnosis not present

## 2022-12-28 DIAGNOSIS — I10 Essential (primary) hypertension: Secondary | ICD-10-CM

## 2022-12-28 DIAGNOSIS — F17210 Nicotine dependence, cigarettes, uncomplicated: Secondary | ICD-10-CM | POA: Diagnosis not present

## 2022-12-28 DIAGNOSIS — I4821 Permanent atrial fibrillation: Principal | ICD-10-CM | POA: Diagnosis present

## 2022-12-28 DIAGNOSIS — Z96651 Presence of right artificial knee joint: Secondary | ICD-10-CM | POA: Diagnosis present

## 2022-12-28 DIAGNOSIS — I700 Atherosclerosis of aorta: Secondary | ICD-10-CM | POA: Diagnosis not present

## 2022-12-28 DIAGNOSIS — L89152 Pressure ulcer of sacral region, stage 2: Secondary | ICD-10-CM | POA: Diagnosis not present

## 2022-12-28 DIAGNOSIS — R739 Hyperglycemia, unspecified: Secondary | ICD-10-CM | POA: Diagnosis not present

## 2022-12-28 DIAGNOSIS — R000 Tachycardia, unspecified: Secondary | ICD-10-CM | POA: Diagnosis not present

## 2022-12-28 DIAGNOSIS — I5022 Chronic systolic (congestive) heart failure: Secondary | ICD-10-CM | POA: Diagnosis not present

## 2022-12-28 DIAGNOSIS — N400 Enlarged prostate without lower urinary tract symptoms: Secondary | ICD-10-CM | POA: Diagnosis present

## 2022-12-28 DIAGNOSIS — I452 Bifascicular block: Secondary | ICD-10-CM | POA: Diagnosis present

## 2022-12-28 DIAGNOSIS — K660 Peritoneal adhesions (postprocedural) (postinfection): Secondary | ICD-10-CM | POA: Diagnosis not present

## 2022-12-28 DIAGNOSIS — K567 Ileus, unspecified: Secondary | ICD-10-CM | POA: Diagnosis not present

## 2022-12-28 DIAGNOSIS — R059 Cough, unspecified: Secondary | ICD-10-CM | POA: Diagnosis not present

## 2022-12-28 DIAGNOSIS — D72819 Decreased white blood cell count, unspecified: Secondary | ICD-10-CM | POA: Diagnosis not present

## 2022-12-28 DIAGNOSIS — I4819 Other persistent atrial fibrillation: Secondary | ICD-10-CM | POA: Diagnosis not present

## 2022-12-28 DIAGNOSIS — Z79899 Other long term (current) drug therapy: Secondary | ICD-10-CM

## 2022-12-28 DIAGNOSIS — Z87891 Personal history of nicotine dependence: Secondary | ICD-10-CM

## 2022-12-28 DIAGNOSIS — N401 Enlarged prostate with lower urinary tract symptoms: Secondary | ICD-10-CM | POA: Diagnosis not present

## 2022-12-28 DIAGNOSIS — M21371 Foot drop, right foot: Secondary | ICD-10-CM | POA: Diagnosis present

## 2022-12-28 DIAGNOSIS — R0602 Shortness of breath: Secondary | ICD-10-CM | POA: Diagnosis not present

## 2022-12-28 DIAGNOSIS — Z7901 Long term (current) use of anticoagulants: Secondary | ICD-10-CM

## 2022-12-28 DIAGNOSIS — J449 Chronic obstructive pulmonary disease, unspecified: Secondary | ICD-10-CM | POA: Diagnosis present

## 2022-12-28 DIAGNOSIS — K56609 Unspecified intestinal obstruction, unspecified as to partial versus complete obstruction: Secondary | ICD-10-CM

## 2022-12-28 DIAGNOSIS — Z4682 Encounter for fitting and adjustment of non-vascular catheter: Secondary | ICD-10-CM | POA: Diagnosis not present

## 2022-12-28 DIAGNOSIS — Z96641 Presence of right artificial hip joint: Secondary | ICD-10-CM | POA: Diagnosis present

## 2022-12-28 DIAGNOSIS — G629 Polyneuropathy, unspecified: Secondary | ICD-10-CM | POA: Diagnosis present

## 2022-12-28 DIAGNOSIS — M48061 Spinal stenosis, lumbar region without neurogenic claudication: Secondary | ICD-10-CM | POA: Diagnosis not present

## 2022-12-28 DIAGNOSIS — L719 Rosacea, unspecified: Secondary | ICD-10-CM | POA: Diagnosis not present

## 2022-12-28 DIAGNOSIS — D72829 Elevated white blood cell count, unspecified: Secondary | ICD-10-CM | POA: Diagnosis present

## 2022-12-28 DIAGNOSIS — K6389 Other specified diseases of intestine: Secondary | ICD-10-CM | POA: Diagnosis not present

## 2022-12-28 DIAGNOSIS — K658 Other peritonitis: Secondary | ICD-10-CM | POA: Diagnosis not present

## 2022-12-28 DIAGNOSIS — F32A Depression, unspecified: Secondary | ICD-10-CM | POA: Diagnosis not present

## 2022-12-28 DIAGNOSIS — E869 Volume depletion, unspecified: Secondary | ICD-10-CM | POA: Diagnosis not present

## 2022-12-28 DIAGNOSIS — K922 Gastrointestinal hemorrhage, unspecified: Secondary | ICD-10-CM | POA: Diagnosis not present

## 2022-12-28 DIAGNOSIS — Z880 Allergy status to penicillin: Secondary | ICD-10-CM

## 2022-12-28 DIAGNOSIS — R109 Unspecified abdominal pain: Secondary | ICD-10-CM | POA: Diagnosis not present

## 2022-12-28 DIAGNOSIS — Z809 Family history of malignant neoplasm, unspecified: Secondary | ICD-10-CM

## 2022-12-28 DIAGNOSIS — F331 Major depressive disorder, recurrent, moderate: Secondary | ICD-10-CM | POA: Diagnosis not present

## 2022-12-28 DIAGNOSIS — R5381 Other malaise: Secondary | ICD-10-CM | POA: Diagnosis not present

## 2022-12-28 DIAGNOSIS — R634 Abnormal weight loss: Secondary | ICD-10-CM | POA: Diagnosis not present

## 2022-12-28 DIAGNOSIS — G8929 Other chronic pain: Secondary | ICD-10-CM | POA: Diagnosis not present

## 2022-12-28 DIAGNOSIS — J9602 Acute respiratory failure with hypercapnia: Secondary | ICD-10-CM | POA: Diagnosis not present

## 2022-12-28 DIAGNOSIS — D751 Secondary polycythemia: Secondary | ICD-10-CM | POA: Diagnosis not present

## 2022-12-28 DIAGNOSIS — R52 Pain, unspecified: Secondary | ICD-10-CM | POA: Diagnosis not present

## 2022-12-28 DIAGNOSIS — K5651 Intestinal adhesions [bands], with partial obstruction: Secondary | ICD-10-CM | POA: Diagnosis not present

## 2022-12-28 HISTORY — DX: Heart failure, unspecified: I50.9

## 2022-12-28 LAB — COMPREHENSIVE METABOLIC PANEL
ALT: 14 U/L (ref 0–44)
AST: 19 U/L (ref 15–41)
Albumin: 3.7 g/dL (ref 3.5–5.0)
Alkaline Phosphatase: 73 U/L (ref 38–126)
Anion gap: 14 (ref 5–15)
BUN: 47 mg/dL — ABNORMAL HIGH (ref 8–23)
CO2: 29 mmol/L (ref 22–32)
Calcium: 8.9 mg/dL (ref 8.9–10.3)
Chloride: 90 mmol/L — ABNORMAL LOW (ref 98–111)
Creatinine, Ser: 2.4 mg/dL — ABNORMAL HIGH (ref 0.61–1.24)
GFR, Estimated: 30 mL/min — ABNORMAL LOW (ref >60)
Glucose, Bld: 112 mg/dL — ABNORMAL HIGH (ref 70–99)
Potassium: 3.5 mmol/L (ref 3.5–5.1)
Sodium: 133 mmol/L — ABNORMAL LOW (ref 135–145)
Total Bilirubin: 1.3 mg/dL — ABNORMAL HIGH (ref 0.3–1.2)
Total Protein: 7.4 g/dL (ref 6.5–8.1)

## 2022-12-28 LAB — CBC WITH DIFFERENTIAL/PLATELET
Abs Immature Granulocytes: 0.05 10*3/uL (ref 0.00–0.07)
Basophils Absolute: 0.1 10*3/uL (ref 0.0–0.1)
Basophils Relative: 1 %
Eosinophils Absolute: 0.1 10*3/uL (ref 0.0–0.5)
Eosinophils Relative: 1 %
HCT: 55.3 % — ABNORMAL HIGH (ref 39.0–52.0)
Hemoglobin: 17.3 g/dL — ABNORMAL HIGH (ref 13.0–17.0)
Immature Granulocytes: 1 %
Lymphocytes Relative: 8 %
Lymphs Abs: 0.6 10*3/uL — ABNORMAL LOW (ref 0.7–4.0)
MCH: 26.3 pg (ref 26.0–34.0)
MCHC: 31.3 g/dL (ref 30.0–36.0)
MCV: 83.9 fL (ref 80.0–100.0)
Monocytes Absolute: 0.7 10*3/uL (ref 0.1–1.0)
Monocytes Relative: 9 %
Neutro Abs: 6.4 10*3/uL (ref 1.7–7.7)
Neutrophils Relative %: 80 %
Platelets: 252 10*3/uL (ref 150–400)
RBC: 6.59 MIL/uL — ABNORMAL HIGH (ref 4.22–5.81)
RDW: 20.7 % — ABNORMAL HIGH (ref 11.5–15.5)
WBC: 7.8 10*3/uL (ref 4.0–10.5)
nRBC: 0.4 % — ABNORMAL HIGH (ref 0.0–0.2)

## 2022-12-28 LAB — MAGNESIUM: Magnesium: 1.8 mg/dL (ref 1.7–2.4)

## 2022-12-28 LAB — BRAIN NATRIURETIC PEPTIDE: B Natriuretic Peptide: 179 pg/mL — ABNORMAL HIGH (ref 0.0–100.0)

## 2022-12-28 LAB — TROPONIN I (HIGH SENSITIVITY)
Troponin I (High Sensitivity): 9 ng/L (ref <18)
Troponin I (High Sensitivity): 8 ng/L (ref <18)

## 2022-12-28 MED ORDER — PANTOPRAZOLE SODIUM 40 MG PO TBEC
40.0000 mg | DELAYED_RELEASE_TABLET | Freq: Two times a day (BID) | ORAL | Status: DC
  Administered 2022-12-29 – 2022-12-30 (×3): 40 mg via ORAL
  Filled 2022-12-28 (×3): qty 1

## 2022-12-28 MED ORDER — FUROSEMIDE 10 MG/ML IJ SOLN
80.0000 mg | Freq: Once | INTRAMUSCULAR | Status: AC
  Administered 2022-12-28: 80 mg via INTRAVENOUS
  Filled 2022-12-28: qty 8

## 2022-12-28 MED ORDER — AMIODARONE LOAD VIA INFUSION
150.0000 mg | Freq: Once | INTRAVENOUS | Status: AC
  Administered 2022-12-28: 150 mg via INTRAVENOUS
  Filled 2022-12-28: qty 83.34

## 2022-12-28 MED ORDER — DILTIAZEM HCL-DEXTROSE 125-5 MG/125ML-% IV SOLN (PREMIX)
5.0000 mg/h | INTRAVENOUS | Status: DC
  Administered 2022-12-28: 5 mg/h via INTRAVENOUS
  Filled 2022-12-28: qty 125

## 2022-12-28 MED ORDER — APIXABAN 5 MG PO TABS
5.0000 mg | ORAL_TABLET | Freq: Two times a day (BID) | ORAL | Status: DC
  Administered 2022-12-29 – 2022-12-30 (×3): 5 mg via ORAL
  Filled 2022-12-28 (×3): qty 1

## 2022-12-28 MED ORDER — METOPROLOL TARTRATE 5 MG/5ML IV SOLN: 2.5000 mg | INTRAVENOUS | Status: AC

## 2022-12-28 MED ORDER — MIDODRINE HCL 5 MG PO TABS: 5.0000 mg | ORAL_TABLET | Freq: Once | ORAL | Status: DC

## 2022-12-28 MED ORDER — ALBUTEROL SULFATE HFA 108 (90 BASE) MCG/ACT IN AERS: 2.0000 | INHALATION_SPRAY | RESPIRATORY_TRACT | Status: DC | PRN

## 2022-12-28 MED ORDER — METOPROLOL TARTRATE 12.5 MG HALF TABLET
12.5000 mg | ORAL_TABLET | Freq: Four times a day (QID) | ORAL | Status: DC
  Administered 2022-12-29 (×2): 12.5 mg via ORAL
  Filled 2022-12-28 (×2): qty 1

## 2022-12-28 MED ORDER — PREGABALIN 50 MG PO CAPS
100.0000 mg | ORAL_CAPSULE | Freq: Every day | ORAL | Status: DC
  Administered 2022-12-29: 100 mg via ORAL
  Filled 2022-12-28 (×2): qty 2

## 2022-12-28 MED ORDER — TAMSULOSIN HCL 0.4 MG PO CAPS
0.4000 mg | ORAL_CAPSULE | Freq: Every day | ORAL | Status: DC
  Administered 2022-12-29 – 2023-01-08 (×7): 0.4 mg via ORAL
  Filled 2022-12-28 (×8): qty 1

## 2022-12-28 MED ORDER — OXYCODONE HCL 5 MG PO TABS
20.0000 mg | ORAL_TABLET | Freq: Four times a day (QID) | ORAL | Status: DC | PRN
  Administered 2022-12-29 – 2022-12-30 (×4): 20 mg via ORAL
  Filled 2022-12-28 (×4): qty 4

## 2022-12-28 MED ORDER — AMIODARONE HCL IN DEXTROSE 360-4.14 MG/200ML-% IV SOLN
60.0000 mg/h | INTRAVENOUS | Status: AC
  Administered 2022-12-28: 60 mg/h via INTRAVENOUS
  Filled 2022-12-28: qty 200

## 2022-12-28 MED ORDER — ONDANSETRON HCL 4 MG/2ML IJ SOLN
4.0000 mg | Freq: Four times a day (QID) | INTRAMUSCULAR | Status: DC | PRN
  Administered 2022-12-28 – 2023-01-08 (×7): 4 mg via INTRAVENOUS
  Filled 2022-12-28 (×7): qty 2

## 2022-12-28 MED ORDER — METOPROLOL TARTRATE 25 MG PO TABS: 12.5000 mg | ORAL_TABLET | Freq: Four times a day (QID) | ORAL | Status: DC

## 2022-12-28 MED ORDER — ACETAMINOPHEN 325 MG PO TABS
650.0000 mg | ORAL_TABLET | ORAL | Status: DC | PRN
  Administered 2022-12-30: 650 mg via ORAL
  Filled 2022-12-28: qty 2

## 2022-12-28 MED ORDER — METOPROLOL TARTRATE 5 MG/5ML IV SOLN
5.0000 mg | Freq: Once | INTRAVENOUS | Status: AC
  Administered 2022-12-28: 5 mg via INTRAVENOUS
  Filled 2022-12-28: qty 5

## 2022-12-28 NOTE — H&P (Addendum)
TRH H&P   Patient Demographics:    Timothy Macias, is a 62 y.o. male  MRN: [id number]   DOB - 03/28/1961  Admit Date - 12/28/2022  Outpatient Primary MD for the patient is Richardean Chimera, MD  Referring MD/NP/PA: PA Idol  Outpatient Specialists: cardiology at [facility] Dr. Sharrell Ku  Patient coming from: home  Chief Complaint  Patient presents with   Shortness of Breath      HPI:    Timothy Macias  is a 62 y.o. male, hx of HFmrEF (45%), OSA on CPAP, persistent A-fib, on Toprol-XL and Eliquis, CHF, hx of osteomyelitis and Bell's Palsy, 3 of recurrent A-fib in the past for which he required cardioversion, most recent during hospital stay at [facility] April 2022),. -Patient's presents to ED secondary to palpitation, rapid heart rate and worsening shortness of breath, he does report fluid retention/weight gain 43 pounds over last 1 to 2 weeks, reports he has been compliant with his Lasix, report intermittent palpitation, he knew it was A-fib, has been going on since Christmas, initially intermittent, but currently more consistent, he does report worsening dyspnea, worsening lower extremity edema, he does report he is compliant with Eliquis and amiodarone, as well he is compliant with his Lasix 80 mg oral twice daily, but report it has not been helping with his fluid retention anymore. -In ED patient was 87% on room air, started on 3 L oxygen nasal cannula, and A-fib with RVR heart rate in the 150s, troponins were within normal limit x 2, blood pressure has been soft, magnesium was 1.8, potassium 3.5, creatinine was elevated at 2.4, well his hemoglobin was elevated at 17.3, ED discussed with cardiology at [facility] Dr. Diona Browner, who recommended initiation of amiodarone drip (patient reported Cardizem drip has not worked for him in the past), and requested to admit to [facility] as no cardiology coverage at [facility] over the weekend.   Review of systems:     A full 10 point Review of Systems was done, except as stated above, all other Review of Systems were negative.   With Past History of the following :    Past Medical History:  Diagnosis Date   Atrial fibrillation (HCC)    CHF (congestive heart failure) (HCC)    Fatty liver    Hypertension    Sleep apnea    wears CPAP      Past Surgical History:  Procedure Laterality Date   HIP ARTHROPLASTY Right    KNEE ARTHROPLASTY Right    TONSILLECTOMY     WISDOM TOOTH EXTRACTION        Social History:     Social History   Tobacco Use   Smoking status: Former    Packs/day: 1.00    Types: Cigarettes   Smokeless tobacco: Never  Substance Use Topics   Alcohol use: Not Currently  Family History :     Family History  Problem Relation Age of Onset   Bone cancer Mother    Other Father        MVA      Home Medications:   Prior to Admission medications   Medication Sig Start Date End Date Taking? Authorizing Provider  metoprolol succinate (TOPROL-XL) 25 MG 24 hr tablet Take 75 mg by mouth daily. 12/10/22  Yes [provider]  pregabalin (LYRICA) 100 MG capsule Take 100 mg by mouth 2 (two) times daily. 04/15/22 11/20/32 Yes [provider]  apixaban (ELIQUIS) 5 MG TABS tablet Take 5 mg by mouth daily. 05/20/18   [provider]  Cholecalciferol (VITAMIN D3) 25 MCG (1000 UT) CAPS Take 1 capsule by mouth daily.    [provider]  Coenzyme Q10 10 MG capsule Take 1 capsule by mouth daily.    [provider]  cyclobenzaprine (FLEXERIL) 10 MG tablet Take 20 mg by mouth at bedtime. 03/27/19   [provider]  digoxin (LANOXIN) 0.125 MG tablet 0.125 mg.  08/19/19   [provider]  diphenhydramine-acetaminophen (TYLENOL PM) 25-500 MG TABS tablet Take 2 tablets by mouth at bedtime as needed.    [provider]   dofetilide (TIKOSYN) 500 MCG capsule Take by mouth. Patient not taking: Reported on 02/15/2022 12/07/19   [provider]  DULoxetine (CYMBALTA) 60 MG capsule Take 60 mg by mouth 2 (two) times daily. 05/04/19   [provider]  furosemide (LASIX) 80 MG tablet Take 80 mg by mouth daily. 02/02/19   [provider]  gabapentin (NEURONTIN) 300 MG capsule Takes 100mg  at hs 01/29/19   [provider]  lisinopril (ZESTRIL) 5 MG tablet Take 2.5 mg by mouth daily. 06/16/19   [provider]  LORazepam (ATIVAN) 0.5 MG tablet Take 0.5 mg by mouth 3 (three) times daily. 03/18/19   [provider]  melatonin 5 MG TABS Take 2-3 tablets by mouth at bedtime as needed.    [provider]  Multiple Vitamins-Minerals (YOUR LIFE MULTI ADULT GUMMIES) CHEW Chew by mouth.    [provider]  Oxycodone HCl 20 MG TABS Take 3 tablets by mouth daily. Takes 3 tablets 3 times a day 06/22/19   [provider]  pantoprazole (PROTONIX) 40 MG tablet Take 1 tablet by mouth 2 (two) times daily. 06/03/19   [provider]  potassium chloride SA (K-DUR) 20 MEQ tablet Take 2 tablets by mouth daily. 04/02/19   [provider]  predniSONE (DELTASONE) 20 MG tablet Take 10 mg by mouth daily.  Patient not taking: Reported on 01/10/2021 06/03/19   [provider]  promethazine (PHENERGAN) 25 MG tablet Take 25 mg by mouth every 4 (four) hours as needed. 06/03/19   [provider]  SUMAtriptan (IMITREX) 100 MG tablet Take 100 mg by mouth as directed. 02/10/16   [provider]  tamsulosin (FLOMAX) 0.4 MG CAPS capsule Take 0.4 mg by mouth daily. 06/03/19   [provider]  testosterone cypionate (DEPOTESTOSTERONE CYPIONATE) 200 MG/ML injection INJECT ONE ML INTRAMUSCULARLY TWICE A WEEK FOR LOW TESTOSTERONE. 06/03/19   [provider]  torsemide (DEMADEX) 100 MG tablet Take 100 mg by mouth 2 (two) times daily. 12/12/20    [provider]     Allergies:     Allergies  Allergen Reactions   Albuterol Swelling   Amiodarone Rash   Penicillins Hives     Physical Exam:   Vitals  Blood pressure 110/74, pulse (!) 134, temperature 97.7 F (36.5 C), temperature source Oral, resp. rate 20, SpO2 92 %.   1. General well developed, with obesity   2. Normal affect and insight, Not Suicidal or Homicidal, Awake Alert, Oriented X 3.  3. No F.N deficits, ALL C.Nerves Intact, Strength 5/5 all 4 extremities, Sensation intact all 4 extremities, Plantars down going.  4. Ears and Eyes appear Normal, Conjunctivae clear, PERRLA. Moist Oral Mucosa.  5. Supple Neck,  No cervical lymphadenopathy appriciated, No Carotid Bruits.  6. Symmetrical Chest wall movement, Good air movement bilaterally, diminished air entry at the bases  7.  Irregular, tachycardic, No Gallops, Rubs or Murmurs, No Parasternal Heave.  +1 edema  8. Positive Bowel Sounds, Abdomen Soft, No tenderness, No organomegaly appriciated,No rebound -guarding or rigidity.  9.  No Cyanosis, Normal Skin Turgor, No Skin Rash or Bruise.  10. Good muscle tone,  joints appear normal , no effusions, Normal ROM.     Data Review:    CBC Recent Labs  Lab 12/28/22 1335  WBC 7.8  HGB 17.3*  HCT 55.3*  PLT 252  MCV 83.9  MCH 26.3  MCHC 31.3  RDW 20.7*  LYMPHSABS 0.6*  MONOABS 0.7  EOSABS 0.1  BASOSABS 0.1   ------------------------------------------------------------------------------------------------------------------  Chemistries  Recent Labs  Lab 12/28/22 1334 12/28/22 1335  NA  --  133*  K  --  3.5  CL  --  90*  CO2  --  29  GLUCOSE  --  112*  BUN  --  47*  CREATININE  --  2.40*  CALCIUM  --  8.9  MG 1.8  --   AST  --  19  ALT  --  14  ALKPHOS  --  73  BILITOT  --  1.3*   ------------------------------------------------------------------------------------------------------------------ CrCl cannot be calculated (Unknown  ideal weight.). ------------------------------------------------------------------------------------------------------------------ No results for input(s): "TSH", "T4TOTAL", "T3FREE", "THYROIDAB" in the last 72 hours.  Invalid input(s): "FREET3"  Coagulation profile No results for input(s): "INR", "PROTIME" in the last 168 hours. ------------------------------------------------------------------------------------------------------------------- No results for input(s): "DDIMER" in the last 72 hours. -------------------------------------------------------------------------------------------------------------------  Cardiac Enzymes No results for input(s): "CKMB", "TROPONINI", "MYOGLOBIN" in the last 168 hours.  Invalid input(s): "CK" ------------------------------------------------------------------------------------------------------------------    Component Value Date/Time   BNP 179.0 (H) 12/28/2022 1335     ---------------------------------------------------------------------------------------------------------------  Urinalysis No results found for: "COLORURINE", "APPEARANCEUR", "LABSPEC", "PHURINE", "GLUCOSEU", "HGBUR", "BILIRUBINUR", "KETONESUR", "PROTEINUR", "UROBILINOGEN", "NITRITE", "LEUKOCYTESUR"  ----------------------------------------------------------------------------------------------------------------   Imaging Results:    DG Chest Port 1 View  Result Date: 12/28/2022 CLINICAL DATA:  Substantial weight loss over several days. Atrial fibrillation. Upper abdominal pain. EXAM: PORTABLE CHEST 1 VIEW COMPARISON:  04/10/2022 FINDINGS: Mild enlargement of the cardiopericardial silhouette, without findings of acute pulmonary edema. Poor definition left hemidiaphragm which is probably related to apical lordotic projection, cannot exclude left lower lobe airspace opacity. Correlate with chest auscultation. The lungs appear otherwise clear.  Mild lower thoracic spondylosis.  IMPRESSION: 1. Mild enlargement of the cardiopericardial silhouette, without edema. 2. Poor definition of the left hemidiaphragm which is probably related to apical lordotic projection, cannot exclude left lower lobe airspace opacity. Correlate with chest auscultation. Electronically Signed   By: Gaylyn Rong M.D.   On: 12/28/2022 13:56     EKG:  QRS duration 175 ms QT/QTcB 344/560 ms P-R-T axes * 230 40 Atrial fibrillation Right bundle branch block Non-specific ST-t changes   Assessment & Plan:    Principal Problem:   Atrial fibrillation with RVR (  [place]) Active Problems:   Essential hypertension   Gastroesophageal reflux disease without esophagitis   Obesity, morbid, BMI 50 or higher (HCC)   OSA on CPAP   AKI (acute kidney injury) ([place])    A-fib with RVR -With known history of persistent A-fib, is on Toprol-XL, amiodarone and Eliquis at home -ED physician discussed with cardiology on-call at [facility] Dr. Domenic Polite, who requested admission to [facility], will be admitted to progressive care at [place] with amiodarone drip, especially with soft blood pressure. -Will continue with p.o. metoprolol at the lower doses 12.5 mg every 6 hours, can consolidate later if blood pressure is soft -Will check TSH -Reports he has been compliant with his Eliquis, if he becomes unstable at 1 point, then he will be a candidate for cardioversion.  AKI -Most likely in the setting of cardiorenal syndrome. -Avoid nephrotoxic medication including lisinopril, will hold diuresis as well especially.  Blood pressure  Acute hypoxic respiratory failure Acute on chronic systolic CHF None ischemic cardiomyopathy -Found with low oxygen saturation 87% on room air, currently on 3 L nasal cannula -EF 45% in 2022, improved 50 to 55% in April 2023 -Elevated BNP -Reports 40 pounds fluid retention/weight gain over the last week or 2 -Difficult evidence of volume overload on physical  exam -Given soft blood pressure, and AKI we will hold on IV diuresis for now  polyneuropathy Chronic  pain syndrome Lumbar stenosis -On significant dose oxycodone, oxycodone 30 mg , 180 tabs monthly, this has been reviewed on [place] DMP website -Continue with Lyrica as well  Acute hypoxic respiratory failure -Due to volume overload, continue with 3 L nasal cannula, wean as tolerated.  Obstructive Sleep Apnea  -Continue with CPAP   Morbid obesity    DVT Prophylaxis Eliquis  AM Labs Ordered, also please review Full Orders  Family Communication: Admission, patients condition and plan of care including tests being ordered have been discussed with the patient  who indicate understanding and agree with the plan and Code Status.  Code Status full  Likely DC to  home  Condition GUARDED    Consults called: ED discussed with cardiology Dr. Domenic Polite in [place], cardiology consult will need to be called when patient is at [facility]  Admission status: inpatient    Time spent in minutes : 75 minutes   Phillips Climes M.D on 12/28/2022 at 5:24 PM   [facility] - Office  [phone]

## 2022-12-28 NOTE — ED Provider Notes (Signed)
[facility] EMERGENCY DEPARTMENT Provider Note   CSN: [id number] Arrival date & time: 12/28/22  1305     History  Chief Complaint  Patient presents with   Shortness of Breath    Timothy Macias is a 62 y.o. male with a history of atrial fibrillation, hypertension, CHF, under the care of Dr. Candis Musa with [facility] cardiology presenting for evaluation of rapid heart rate in association with increasing shortness of breath and a 40 pound weight gain over the past 7 to 10 days.  He states he has had intermittent palpitations and knew he was in a fib since around Christmas but it was intermittent.  It has become constant over the past week. He has been compliant with his home medications including his Eliquis and amiodarone, he is also on Lasix 80 mg twice daily which seems to have stopped working.  He has been careful with his p.o. intake, avoiding salt in his diet.  He does endorse orthopnea, increased peripheral edema.  Wife at bedside states Cardizem has been tried in the past with other episodes of atrial fibrillation but was not helpful, stating he required metoprolol and amiodarone.  She states additionally he has had cardiac ablation and has been cardioverted times twice.  He denies chest pain, he has had a nonproductive cough, no fevers or chills.  Of note, he does have amiodarone listed as an allergy (rash) but has been taking amiodarone p.o. without any allergic reaction.   The history is provided by the patient and the spouse.       Home Medications Prior to Admission medications   Medication Sig Start Date End Date Taking? Authorizing Provider  metoprolol succinate (TOPROL-XL) 25 MG 24 hr tablet Take 75 mg by mouth daily. 12/10/22  Yes [provider]  pregabalin (LYRICA) 100 MG capsule Take 100 mg by mouth 2 (two) times daily. 04/15/22 11/20/32 Yes [provider]  apixaban (ELIQUIS) 5 MG TABS tablet Take 5 mg by mouth daily. 05/20/18   [provider]   Cholecalciferol (VITAMIN D3) 25 MCG (1000 UT) CAPS Take 1 capsule by mouth daily.    [provider]  Coenzyme Q10 10 MG capsule Take 1 capsule by mouth daily.    [provider]  cyclobenzaprine (FLEXERIL) 10 MG tablet Take 20 mg by mouth at bedtime. 03/27/19   [provider]  digoxin (LANOXIN) 0.125 MG tablet 0.125 mg.  08/19/19   [provider]  diphenhydramine-acetaminophen (TYLENOL PM) 25-500 MG TABS tablet Take 2 tablets by mouth at bedtime as needed.    [provider]  dofetilide (TIKOSYN) 500 MCG capsule Take by mouth. Patient not taking: Reported on 02/15/2022 12/07/19   [provider]  DULoxetine (CYMBALTA) 60 MG capsule Take 60 mg by mouth 2 (two) times daily. 05/04/19   [provider]  furosemide (LASIX) 80 MG tablet Take 80 mg by mouth daily. 02/02/19   [provider]  gabapentin (NEURONTIN) 300 MG capsule Takes 100mg  at hs 01/29/19   [provider]  lisinopril (ZESTRIL) 5 MG tablet Take 2.5 mg by mouth daily. 06/16/19   [provider]  LORazepam (ATIVAN) 0.5 MG tablet Take 0.5 mg by mouth 3 (three) times daily. 03/18/19   [provider]  melatonin 5 MG TABS Take 2-3 tablets by mouth at bedtime as needed.    [provider]  Multiple Vitamins-Minerals (YOUR LIFE MULTI ADULT GUMMIES) CHEW Chew by mouth.    [provider]  Oxycodone HCl 20 MG  TABS Take 3 tablets by mouth daily. Takes 3 tablets 3 times a day 06/22/19   [provider]  pantoprazole (PROTONIX) 40 MG tablet Take 1 tablet by mouth 2 (two) times daily. 06/03/19   [provider]  potassium chloride SA (K-DUR) 20 MEQ tablet Take 2 tablets by mouth daily. 04/02/19   [provider]  predniSONE (DELTASONE) 20 MG tablet Take 10 mg by mouth daily.  Patient not taking: Reported on 01/10/2021 06/03/19   [provider]  promethazine (PHENERGAN) 25 MG tablet Take 25 mg by mouth every 4  (four) hours as needed. 06/03/19   [provider]  SUMAtriptan (IMITREX) 100 MG tablet Take 100 mg by mouth as directed. 02/10/16   [provider]  tamsulosin (FLOMAX) 0.4 MG CAPS capsule Take 0.4 mg by mouth daily. 06/03/19   [provider]  testosterone cypionate (DEPOTESTOSTERONE CYPIONATE) 200 MG/ML injection INJECT ONE ML INTRAMUSCULARLY TWICE A WEEK FOR LOW TESTOSTERONE. 06/03/19   [provider]  torsemide (DEMADEX) 100 MG tablet Take 100 mg by mouth 2 (two) times daily. 12/12/20   [provider]      Allergies    Albuterol, Amiodarone, and Penicillins    Review of Systems   Review of Systems  Constitutional:  Negative for chills and fever.  HENT:  Negative for congestion and sore throat.   Eyes: Negative.   Respiratory:  Positive for shortness of breath. Negative for chest tightness.   Cardiovascular:  Positive for palpitations and leg swelling. Negative for chest pain.  Gastrointestinal:  Negative for abdominal pain, nausea and vomiting.  Genitourinary: Negative.   Musculoskeletal:  Negative for arthralgias, joint swelling and neck pain.  Skin: Negative.  Negative for rash and wound.  Neurological:  Negative for dizziness, weakness, light-headedness, numbness and headaches.  Psychiatric/Behavioral: Negative.    All other systems reviewed and are negative.   Physical Exam Updated Vital Signs BP 112/83   Pulse (!) 117   Temp 97.7 F (36.5 C) (Oral)   Resp (!) 22   SpO2 94%  Physical Exam Vitals and nursing note reviewed.  Constitutional:      Appearance: He is well-developed.  HENT:     Head: Normocephalic and atraumatic.  Eyes:     Conjunctiva/sclera: Conjunctivae normal.  Cardiovascular:     Rate and Rhythm: Normal rate and regular rhythm.     Heart sounds: Normal heart sounds.  Pulmonary:     Effort: Pulmonary effort is normal.     Breath sounds: Examination of the right-lower field reveals rales. Examination of  the left-lower field reveals rales. Rales present. No wheezing or rhonchi.  Abdominal:     General: Bowel sounds are normal.     Palpations: Abdomen is soft.     Tenderness: There is no abdominal tenderness.  Musculoskeletal:        General: Normal range of motion.     Cervical back: Normal range of motion.     Right lower leg: Edema present.     Left lower leg: Edema present.  Skin:    General: Skin is warm and dry.  Neurological:     Mental Status: He is alert.     ED Results / Procedures / Treatments   Labs (all labs ordered are listed, but only abnormal results are displayed) Labs Reviewed  CBC WITH DIFFERENTIAL/PLATELET - Abnormal; Notable for the following components:      Result Value   RBC 6.59 (*)    Hemoglobin 17.3 (*)  HCT 55.3 (*)    RDW 20.7 (*)    nRBC 0.4 (*)    Lymphs Abs 0.6 (*)    All other components within normal limits  COMPREHENSIVE METABOLIC PANEL - Abnormal; Notable for the following components:   Sodium 133 (*)    Chloride 90 (*)    Glucose, Bld 112 (*)    BUN 47 (*)    Creatinine, Ser 2.40 (*)    Total Bilirubin 1.3 (*)    GFR, Estimated 30 (*)    All other components within normal limits  BRAIN NATRIURETIC PEPTIDE - Abnormal; Notable for the following components:   B Natriuretic Peptide 179.0 (*)    All other components within normal limits  MAGNESIUM  BRAIN NATRIURETIC PEPTIDE  TROPONIN I (HIGH SENSITIVITY)  TROPONIN I (HIGH SENSITIVITY)    EKG EKG Interpretation  Date/Time:  Friday December 28 2022 13:14:50 EST Ventricular Rate:  146 PR Interval:    QRS Duration: 175 QT Interval:  344 QTC Calculation: 560 R Axis:   230 Text Interpretation: Atrial fibrillation Right bundle branch block Non-specific ST-t changes Confirmed by Cathren Laine ([id number]) on 12/28/2022 1:21:00 PM  Radiology DG Chest Port 1 View  Result Date: 12/28/2022 CLINICAL DATA:  Substantial weight loss over several days. Atrial fibrillation. Upper abdominal pain.  EXAM: PORTABLE CHEST 1 VIEW COMPARISON:  04/10/2022 FINDINGS: Mild enlargement of the cardiopericardial silhouette, without findings of acute pulmonary edema. Poor definition left hemidiaphragm which is probably related to apical lordotic projection, cannot exclude left lower lobe airspace opacity. Correlate with chest auscultation. The lungs appear otherwise clear.  Mild lower thoracic spondylosis. IMPRESSION: 1. Mild enlargement of the cardiopericardial silhouette, without edema. 2. Poor definition of the left hemidiaphragm which is probably related to apical lordotic projection, cannot exclude left lower lobe airspace opacity. Correlate with chest auscultation. Electronically Signed   By: Gaylyn Rong M.D.   On: 12/28/2022 13:56    Procedures Procedures    Medications Ordered in ED Medications  amiodarone (NEXTERONE) 1.8 mg/mL load via infusion 150 mg (150 mg Intravenous Bolus from Bag 12/28/22 1714)    Followed by  amiodarone (NEXTERONE PREMIX) 360-4.14 MG/200ML-% (1.8 mg/mL) IV infusion (60 mg/hr Intravenous New Bag/Given 12/28/22 1728)  furosemide (LASIX) injection 80 mg (80 mg Intravenous Given 12/28/22 1353)  metoprolol tartrate (LOPRESSOR) injection 5 mg (5 mg Intravenous Given 12/28/22 1533)    ED Course/ Medical Decision Making/ A&P                           Medical Decision Making Pt with afib with rvr,  intermittent since Christmas, continuous x [redacted] week along with increased sob and 40 lb weight gain.    Amount and/or Complexity of Data Reviewed Labs: ordered. Radiology: ordered. Discussion of management or test interpretation with external provider(s): Patient's presentation, labs, home medications discussed with Dr. Diona Browner of cardiology.  He is recommending hospitalization at [facility] under the internal medicine service as we will not have cardiology here on the weekend.  He will need an echocardiogram, concern for decompensating cardiomyopathy secondary to A-fib causing CHF  exacerbation.  May need to consider low-dose beta-blocker if his blood pressure will allow, but agrees with amiodarone to start, he will also need to be diuresed, may need to consider midodrine 5 mg twice daily to help support his blood pressure.  Call placed to Dr. Randol Kern with the hospitalist service who accepts pt for admission.  Risk Prescription drug management.  Final Clinical Impression(s) / ED Diagnoses Final diagnoses:  Atrial fibrillation with rapid ventricular response ([place])  Acute on chronic congestive heart failure, unspecified heart failure type [facility])    Rx / DC Orders ED Discharge Orders     None         Landis Martins 12/28/22 1747    Lajean Saver, MD 12/29/22 2046

## 2022-12-28 NOTE — ED Notes (Addendum)
EKG from ems similar to last one per pcp office. Lung sounds diminished but clear at this time

## 2022-12-28 NOTE — Progress Notes (Signed)
Spoke with patient about CPAP tonight.  Patient did not want CPAP tonight, patient has had N/V, was getting medication for that and wanted to continue to use [place].

## 2022-12-28 NOTE — ED Triage Notes (Addendum)
Gained about 40lbs in over a few days. Arrived a/o, audible upper airway wheezing. Came from pcp bib ems. A fib uncontrolled today. C/o upper abd pain "from the pressure" of the fluid. 25mg  bolus cardizem in EMS with no change but hr came down with the 10mg /hr drip

## 2022-12-28 NOTE — ED Notes (Signed)
Report given to Dawn RN

## 2022-12-29 ENCOUNTER — Inpatient Hospital Stay (HOSPITAL_COMMUNITY): Payer: BC Managed Care – PPO

## 2022-12-29 DIAGNOSIS — J9601 Acute respiratory failure with hypoxia: Secondary | ICD-10-CM

## 2022-12-29 DIAGNOSIS — I4891 Unspecified atrial fibrillation: Secondary | ICD-10-CM | POA: Diagnosis not present

## 2022-12-29 DIAGNOSIS — I509 Heart failure, unspecified: Secondary | ICD-10-CM

## 2022-12-29 DIAGNOSIS — N179 Acute kidney failure, unspecified: Secondary | ICD-10-CM

## 2022-12-29 DIAGNOSIS — I4821 Permanent atrial fibrillation: Secondary | ICD-10-CM

## 2022-12-29 DIAGNOSIS — J810 Acute pulmonary edema: Secondary | ICD-10-CM

## 2022-12-29 LAB — BASIC METABOLIC PANEL
Anion gap: 17 — ABNORMAL HIGH (ref 5–15)
BUN: 54 mg/dL — ABNORMAL HIGH (ref 8–23)
CO2: 28 mmol/L (ref 22–32)
Calcium: 8.3 mg/dL — ABNORMAL LOW (ref 8.9–10.3)
Chloride: 90 mmol/L — ABNORMAL LOW (ref 98–111)
Creatinine, Ser: 2.36 mg/dL — ABNORMAL HIGH (ref 0.61–1.24)
GFR, Estimated: 31 mL/min — ABNORMAL LOW (ref >60)
Glucose, Bld: 137 mg/dL — ABNORMAL HIGH (ref 70–99)
Potassium: 3.6 mmol/L (ref 3.5–5.1)
Sodium: 135 mmol/L (ref 135–145)

## 2022-12-29 LAB — CBC
HCT: 54.6 % — ABNORMAL HIGH (ref 39.0–52.0)
Hemoglobin: 16.9 g/dL (ref 13.0–17.0)
MCH: 26.1 pg (ref 26.0–34.0)
MCHC: 31 g/dL (ref 30.0–36.0)
MCV: 84.4 fL (ref 80.0–100.0)
Platelets: 218 10*3/uL (ref 150–400)
RBC: 6.47 MIL/uL — ABNORMAL HIGH (ref 4.22–5.81)
RDW: 21.1 % — ABNORMAL HIGH (ref 11.5–15.5)
WBC: 5.5 10*3/uL (ref 4.0–10.5)
nRBC: 0 % (ref 0.0–0.2)

## 2022-12-29 LAB — HIV ANTIBODY (ROUTINE TESTING W REFLEX): HIV Screen 4th Generation wRfx: NONREACTIVE

## 2022-12-29 LAB — TROPONIN I (HIGH SENSITIVITY)
Troponin I (High Sensitivity): 7 ng/L (ref <18)
Troponin I (High Sensitivity): 7 ng/L (ref <18)

## 2022-12-29 LAB — BRAIN NATRIURETIC PEPTIDE: B Natriuretic Peptide: 178 pg/mL — ABNORMAL HIGH (ref 0.0–100.0)

## 2022-12-29 MED ORDER — PROMETHAZINE HCL 25 MG/ML IJ SOLN
INTRAMUSCULAR | Status: AC
  Filled 2022-12-29: qty 1

## 2022-12-29 MED ORDER — AMIODARONE HCL IN DEXTROSE 360-4.14 MG/200ML-% IV SOLN
30.0000 mg/h | INTRAVENOUS | Status: DC
  Administered 2022-12-30: 30 mg/h via INTRAVENOUS
  Administered 2022-12-30: 60 mg/h via INTRAVENOUS
  Administered 2022-12-30: 30 mg/h via INTRAVENOUS
  Administered 2022-12-31 – 2023-01-03 (×15): 60 mg/h via INTRAVENOUS
  Administered 2023-01-04: 30 mg/h via INTRAVENOUS
  Administered 2023-01-04 (×2): 60 mg/h via INTRAVENOUS
  Filled 2022-12-29 (×21): qty 200

## 2022-12-29 MED ORDER — FUROSEMIDE 10 MG/ML IJ SOLN
80.0000 mg | Freq: Two times a day (BID) | INTRAMUSCULAR | Status: DC
  Administered 2022-12-29 – 2022-12-30 (×2): 80 mg via INTRAVENOUS
  Filled 2022-12-29 (×2): qty 8

## 2022-12-29 MED ORDER — AMIODARONE LOAD VIA INFUSION
150.0000 mg | Freq: Once | INTRAVENOUS | Status: DC
  Filled 2022-12-29: qty 83.34

## 2022-12-29 MED ORDER — ALUM & MAG HYDROXIDE-SIMETH 200-200-20 MG/5ML PO SUSP
15.0000 mL | Freq: Once | ORAL | Status: AC
  Administered 2022-12-29: 15 mL via ORAL
  Filled 2022-12-29: qty 30

## 2022-12-29 MED ORDER — ALPRAZOLAM 0.5 MG PO TABS
1.0000 mg | ORAL_TABLET | Freq: Three times a day (TID) | ORAL | Status: DC | PRN
  Administered 2022-12-29 (×2): 1 mg via ORAL
  Filled 2022-12-29 (×2): qty 2

## 2022-12-29 MED ORDER — METOPROLOL SUCCINATE ER 25 MG PO TB24
25.0000 mg | ORAL_TABLET | Freq: Two times a day (BID) | ORAL | Status: DC
  Administered 2022-12-29 – 2022-12-30 (×2): 25 mg via ORAL
  Filled 2022-12-29 (×2): qty 1

## 2022-12-29 MED ORDER — AMIODARONE HCL IN DEXTROSE 360-4.14 MG/200ML-% IV SOLN
30.0000 mg/h | INTRAVENOUS | Status: DC
  Administered 2022-12-29 (×2): 30 mg/h via INTRAVENOUS

## 2022-12-29 MED ORDER — DIGOXIN 0.25 MG/ML IJ SOLN
0.5000 mg | Freq: Once | INTRAMUSCULAR | Status: AC
  Administered 2022-12-29: 0.5 mg via INTRAVENOUS
  Filled 2022-12-29 (×2): qty 2

## 2022-12-29 MED ORDER — AMIODARONE HCL IN DEXTROSE 360-4.14 MG/200ML-% IV SOLN
60.0000 mg/h | INTRAVENOUS | Status: AC
  Administered 2022-12-29 (×2): 60 mg/h via INTRAVENOUS
  Filled 2022-12-29: qty 200

## 2022-12-29 MED ORDER — SODIUM CHLORIDE 0.9 % IV SOLN: INTRAVENOUS | Status: AC

## 2022-12-29 MED ORDER — DIGOXIN 0.25 MG/ML IJ SOLN
0.2500 mg | Freq: Once | INTRAMUSCULAR | Status: AC
  Administered 2022-12-29: 0.25 mg via INTRAVENOUS
  Filled 2022-12-29: qty 1

## 2022-12-29 MED ORDER — SODIUM CHLORIDE 0.9 % IV SOLN
12.5000 mg | Freq: Four times a day (QID) | INTRAVENOUS | Status: DC | PRN
  Filled 2022-12-29 (×2): qty 0.5

## 2022-12-29 MED ORDER — MELATONIN 5 MG PO TABS
5.0000 mg | ORAL_TABLET | Freq: Every evening | ORAL | Status: DC | PRN
  Administered 2022-12-29: 5 mg via ORAL
  Filled 2022-12-29: qty 1

## 2022-12-29 NOTE — ED Notes (Signed)
Lunch tray offered. Patient does not want to eat at this time.

## 2022-12-29 NOTE — ED Notes (Signed)
Patient is complaining of increased pain all over his body, states they he normally takes oxycodone 4 times of day and hasn't had any since arriving to hospital.   Pt also states concern that he may be experiencing withdrawal from pain medications because he feels confused and is having tremors

## 2022-12-29 NOTE — Significant Event (Signed)
Rapid Response Event Note   Reason for Call :  Afib RVR  Initial Focused Assessment:  Pt lying in bed with eye closed. He is alert and oriented, c/o 6/10 L sided chest pain/pressure, SOB. Lungs diminished t/o. ABD large/distended/soft. Skin warm and diaphoretic.  HR-151(Afib), BP-115/85, RR-18, SpO2-93% on 4L [place].   Interventions:  EKG-Afib RVR Digoxin 0.5mg  IV  80mg  Lasix IV 25mg  metoprolol PO Plan of Care:  Pt HR still 140-160s, BP-114/78. Pt resting when not stimulated. Allow time for meds to work. Continue to monitor pt. Call RRT if further assistance needed.   Event Summary:   MD Notified: Dr. Rudi Rummage @ bedside on RRT arrival Call [place] [phone]  Dillard Essex, RN

## 2022-12-29 NOTE — Hospital Course (Addendum)
Bejamin Hackbart  is a 62 y.o. male, hx of HFmrEF (45%), OSA on CPAP, persistent A-fib, on Toprol-XL and Eliquis, CHF, hx of osteomyelitis and Bell's Palsy, 3 of recurrent A-fib in the past for which he required cardioversion, most recent during hospital stay at [facility] April 2022),. -Patient's presents to ED secondary to palpitation, rapid heart rate and worsening shortness of breath, he does report fluid retention/weight gain 43 pounds over last 1 to 2 weeks, reports he has been compliant with his Lasix, report intermittent palpitation, he knew it was A-fib, has been going on since Christmas, initially intermittent, but currently more consistent, he does report worsening dyspnea, worsening lower extremity edema, he does report he is compliant with Eliquis and amiodarone, as well he is compliant with his Lasix 80 mg oral twice daily, but report it has not been helping with his fluid retention   ED: He was 87% on room air, started on 3 L oxygen nasal cannula, and A-fib with RVR heart rate in the 150s, troponins were within normal limit x 2, blood pressure has been soft, magnesium was 1.8, potassium 3.5, creatinine was elevated at 2.4, well his hemoglobin was elevated at 17.3, EDP discussed with cardiology at [facility] Dr. Domenic Polite, who recommended initiation of amiodarone drip (patient reported Cardizem drip has not worked for him in the past), Requested to admit to [facility] as no cardiology coverage at [facility] over the weekend.  1/6 admitted 1/7 amiodarone infusion, IVF, NGT placed to decompress abdomen. Transferring to ICU. 1/9 DCCV failed due to ongoing bowel obstruction. 1/10 underwent laparotomy with lysis of adhesions 1/11 extubated 1/12 blood from incision with heparin restart.

## 2022-12-29 NOTE — Progress Notes (Signed)
Wife, who is a Marine scientist, called with a couple concerns. He has a history of bowel surgery, namely umbilical hernia repair and recently has had several occurrences of emesis. She reports it has been dark colored and that the patient has stated it has tasted sweet. She also states his abdomen has been more distended. Abdomen is tender to deep palpation on assessment but not reporting general pain. Wife is curious and hopeful CT might be performed.

## 2022-12-29 NOTE — Progress Notes (Incomplete)
*  PRELIMINARY RESULTS* Echocardiogram 2D Echocardiogram has been rescheduled.Patient is experiencing heart rates in the 150's. Will attempt echo when heart rate is lower.  Elpidio Anis 12/29/2022, 10:27 AM

## 2022-12-29 NOTE — ED Notes (Signed)
Pt reports increased weakness and "lousy feeling." He is noticeably lethargic.

## 2022-12-29 NOTE — Progress Notes (Signed)
Symptomatic from Afib RVR, given digoxin, lasix, metoprolol, on amio drip per order from Dr Toney Rakes. Rapid RN, Mindy, also at bedside assisting. Patient with some chest pain, hot and clammy. Post-interventions patient feels more comfortable. States no pain and doesn't feel as hot and sweaty.    12/29/22 1918  Assess: MEWS Score  BP 123/85  MAP (mmHg) 94  ECG Heart Rate (!) 161  Resp (!) 22  Assess: MEWS Score  MEWS Temp 0  MEWS Systolic 0  MEWS Pulse 3  MEWS RR 1  MEWS LOC 0  MEWS Score 4  MEWS Score Color Red  Assess: if the MEWS score is Yellow or Red  Were vital signs taken at a resting state? Yes  Focused Assessment No change from prior assessment  Does the patient meet 2 or more of the SIRS criteria? Yes  Does the patient have a confirmed or suspected source of infection? No  MEWS guidelines implemented *See Row Information* Yes  Treat  MEWS Interventions Escalated (See documentation below);Administered scheduled meds/treatments  Take Vital Signs  Increase Vital Sign Frequency  Red: Q 1hr X 4 then Q 4hr X 4, if remains red, continue Q 4hrs  Escalate  MEWS: Escalate Red: discuss with charge nurse/RN and provider, consider discussing with RRT  Notify: Charge Nurse/RN  Name of Charge Nurse/RN Notified Janett Billow and Sharee Pimple  Date Charge Nurse/RN Notified 12/29/22  Time Charge Nurse/RN Notified 1920  Provider Notification  Provider Name/Title Dr Toney Rakes  Notify: Rapid Response  Name of Rapid Response RN Notified Mindy  Date Rapid Response Notified 12/29/22  Time Rapid Response Notified  (at bedside from dayshift)  Document  Patient Outcome Stabilized after interventions  Assess: SIRS CRITERIA  SIRS Temperature  0  SIRS Pulse 1  SIRS Respirations  1  SIRS WBC 0  SIRS Score Sum  2

## 2022-12-29 NOTE — Progress Notes (Signed)
PROGRESS NOTE    Patient: Timothy Macias                            PCP: Caryl Bis, MD                    DOB: 1961-05-27            DOA: 12/28/2022 [id number]             DOS: 12/29/2022, 11:23 AM   LOS: 1 day   Date of Service: The patient was seen and examined on 12/29/2022  Subjective:   The patient was seen and examined this morning. Tachycardic, in A-fib with RVR, heart rate around 140, complaining of nausea and vomiting, vomited once this morning.... Complain of mild chest pain denies any shortness of breath Restarting amiodarone drip  Pending transfer to [facility]  Brief Narrative:   Timothy Macias  is a 62 y.o. male, hx of HFmrEF (45%), OSA on CPAP, persistent A-fib, on Toprol-XL and Eliquis, CHF, hx of osteomyelitis and Bell's Palsy, 3 of recurrent A-fib in the past for which he required cardioversion, most recent during hospital stay at [facility] April 2022),. -Patient's presents to ED secondary to palpitation, rapid heart rate and worsening shortness of breath, he does report fluid retention/weight gain 43 pounds over last 1 to 2 weeks, reports he has been compliant with his Lasix, report intermittent palpitation, he knew it was A-fib, has been going on since Christmas, initially intermittent, but currently more consistent, he does report worsening dyspnea, worsening lower extremity edema, he does report he is compliant with Eliquis and amiodarone, as well he is compliant with his Lasix 80 mg oral twice daily, but report it has not been helping with his fluid retention   ED: He was 87% on room air, started on 3 L oxygen nasal cannula, and A-fib with RVR heart rate in the 150s, troponins were within normal limit x 2, blood pressure has been soft, magnesium was 1.8, potassium 3.5, creatinine was elevated at 2.4, well his hemoglobin was elevated at 17.3, EDP discussed with cardiology at [facility] Dr. Domenic Polite, who recommended initiation of amiodarone drip (patient reported  Cardizem drip has not worked for him in the past), Requested to admit to [facility] as no cardiology coverage at [facility] over the weekend.      Assessment & Plan:     Principal Problem:   Atrial fibrillation with RVR (HCC) Active Problems:   Essential hypertension   Gastroesophageal reflux disease without esophagitis   Obesity, morbid, BMI 50 or higher (HCC)   OSA on CPAP   AKI (acute kidney injury) ([place])       A-fib with RVR -This morning patient remained in A-fib with RVR, heart rate around 140s -Restarting amiodarone drip -Complaining of mild chest pain, denies any shortness of breath, mildly hypertensive   -With known history of persistent A-fib, is on Toprol-XL, amiodarone and Eliquis at home -ED physician discussed with cardiology on-call at [facility] Dr. Domenic Polite, who requested admission to [facility], will be admitted to progressive care at [place] with amiodarone drip, especially with soft blood pressure.    Blood pressure 110/68, pulse 75, temperature (!) 97.5 F (36.4 C), resp. rate (!) 23, SpO2 93 %.    -Will continue with p.o. metoprolol at the lower doses 12.5 mg every 6 hours,  -TSH - Trop: 9, 8  -  Reports he has been compliant with his Eliquis, if he becomes unstable at 1 point, then he will be a candidate for cardioversion.   AKI -Most likely in the setting of cardiorenal syndrome. -Avoid nephrotoxic medication including lisinopril, will hold diuresis as well especially.  Blood pressure  Lab Results  Component Value Date   CREATININE 2.36 (H) 12/29/2022   CREATININE 2.40 (H) 12/28/2022      Acute hypoxic respiratory failure / Acute on chronic systolic CHF None ischemic cardiomyopathy -Found with low oxygen saturation 87% on room air, currently on 4 L nasal cannula -  -EF 45% in 2022, improved 50 to 55% in April 2023 -Elevated BNP: 179, 178  -Reports 40 pounds fluid retention/weight gain over the last  week or 2 -Difficult evidence of volume overload on physical exam -Given AKI we will hold on IV diuresis for now   Polyneuropathy -Chronic  pain syndrome Lumbar stenosis -On significant dose oxycodone, oxycodone 30 mg , 180 tabs monthly, this has been reviewed on [place] DMP website -Continue with Lyrica as well  Acute hypoxic respiratory failure -Due to volume overload, continue with 3 L nasal cannula, wean as tolerated.   Obstructive Sleep Apnea  -Continue with CPAP    Morbid obesity There is no height or weight on file to calculate BMI. Last weight: 138.8 KG      ------------------------------------------------------------------------------------------------------------------------------------------------  DVT prophylaxis:   apixaban (ELIQUIS) tablet 5 mg   Code Status:   Code Status: Full Code  Family Communication: No family member present at bedside- attempt will be made to update daily The above findings and plan of care has been discussed with patient (and family)  in detail,  they expressed understanding and agreement of above. -Advance care planning has been discussed.   Admission status:   Status is: Inpatient Remains inpatient appropriate because: Needing IV amiodarone drip for better heart control, hypotensive Needing fluid, heart rate management, blood pressure management     Procedures:   No admission procedures for hospital encounter.   Antimicrobials:  Anti-infectives (From admission, onward)    None        Medication:   amiodarone  150 mg Intravenous Once   apixaban  5 mg Oral BID   metoprolol tartrate  12.5 mg Oral Q6H   pantoprazole  40 mg Oral BID   pregabalin  100 mg Oral Daily   tamsulosin  0.4 mg Oral Daily    acetaminophen, ALPRAZolam, ondansetron (ZOFRAN) IV, oxyCODONE, promethazine (PHENERGAN) injection (IM or IVPB)   Objective:   Vitals:   12/29/22 0830 12/29/22 0845 12/29/22 0900 12/29/22 1000  BP: 94/62  106/69 110/68   Pulse: (!) 122 75    Resp: 18 16 16  (!) 23  Temp:      TempSrc:      SpO2: 91% 93%      Intake/Output Summary (Last 24 hours) at 12/29/2022 1123 Last data filed at 12/29/2022 0853 Gross per 24 hour  Intake --  Output 950 ml  Net -950 ml   There were no vitals filed for this visit.   Examination:   Physical Exam  Constitution: Morbidly obese male, alert, cooperative, no distress,  Appears calm and comfortable  Psychiatric:   Normal and stable mood and affect, cognition intact,   HEENT:        Normocephalic, PERRL, otherwise with in Normal limits  Chest:         Chest symmetric Cardio vascular: Tachycardic, in A-fib, No murmure, No Rubs or Gallops  pulmonary: Clear to auscultation bilaterally, respirations unlabored, negative wheezes / crackles Abdomen: Soft, non-tender, non-distended, bowel sounds,no masses, no organomegaly Muscular skeletal: Limited exam - in bed, able to move all 4 extremities,   Neuro: CNII-XII intact. , normal motor and sensation, reflexes intact  Extremities: No pitting edema lower extremities, +2 pulses  Skin: Dry, warm to touch, negative for any Rashes, No open wounds Wounds: per nursing documentation   ------------------------------------------------------------------------------------------------------------------------------------------    LABs:     Latest Ref Rng & Units 12/29/2022    5:52 AM 12/28/2022    1:35 PM  CBC  WBC 4.0 - 10.5 K/uL 5.5  7.8   Hemoglobin 13.0 - 17.0 g/dL 10.9  32.3   Hematocrit 39.0 - 52.0 % 54.6  55.3   Platelets 150 - 400 K/uL 218  252       Latest Ref Rng & Units 12/29/2022    5:52 AM 12/28/2022    1:35 PM 09/03/2019    2:50 PM  CMP  Glucose 70 - 99 mg/dL 557  322    BUN 8 - 23 mg/dL 54  47    Creatinine 0.25 - 1.24 mg/dL 4.27  0.62    Sodium 376 - 145 mmol/L 135  133    Potassium 3.5 - 5.1 mmol/L 3.6  3.5    Chloride 98 - 111 mmol/L 90  90    CO2 22 - 32 mmol/L 28  29    Calcium 8.9 - 10.3 mg/dL 8.3  8.9     Total Protein 6.5 - 8.1 g/dL  7.4  6.1   Total Bilirubin 0.3 - 1.2 mg/dL  1.3    Alkaline Phos 38 - 126 U/L  73    AST 15 - 41 U/L  19    ALT 0 - 44 U/L  14         Micro Results No results found for this or any previous visit (from the past 240 hour(s)).  Radiology Reports DG Chest Port 1 View  Result Date: 12/28/2022 CLINICAL DATA:  Substantial weight loss over several days. Atrial fibrillation. Upper abdominal pain. EXAM: PORTABLE CHEST 1 VIEW COMPARISON:  04/10/2022 FINDINGS: Mild enlargement of the cardiopericardial silhouette, without findings of acute pulmonary edema. Poor definition left hemidiaphragm which is probably related to apical lordotic projection, cannot exclude left lower lobe airspace opacity. Correlate with chest auscultation. The lungs appear otherwise clear.  Mild lower thoracic spondylosis. IMPRESSION: 1. Mild enlargement of the cardiopericardial silhouette, without edema. 2. Poor definition of the left hemidiaphragm which is probably related to apical lordotic projection, cannot exclude left lower lobe airspace opacity. Correlate with chest auscultation. Electronically Signed   By: Gaylyn Rong M.D.   On: 12/28/2022 13:56    SIGNED: Kendell Bane, MD, FHM. [facility],  Pager (please use [email or web address] to page/text) Please use Epic Secure Chat for non-urgent communication (7AM-7PM)  If 7PM-7AM, please contact night-coverage [email or web address], 12/29/2022, 11:23 AM

## 2022-12-29 NOTE — Progress Notes (Addendum)
RN called me and asked me to bring patient a CPAP machine, patient felt like he could wear it.  Took machine into room and set it up for patient and had patient on it.  Patient became nauseated and felt like he needed to throw up again despite having nausea medication before going on machine.  Patient placed back on [place] 4L, machine is at bedside when patient feels better and is able to use.

## 2022-12-29 NOTE — ED Notes (Signed)
Breakfast tray offered, but patient is vomiting at this time.

## 2022-12-29 NOTE — Consult Note (Signed)
Cardiology Consultation   Patient ID: Timothy [facility]ias MRN: [id number]; DOB: Nov 01, 1961  Admit date: 12/28/2022 Date of Consult: 12/29/2022  PCP:  Timothy Chimera, Timothy   [facility] Providers Cardiologist:  None        Patient Profile:   Timothy Macias is a 62 y.o. male with a hx of HFmrEF (45%), OSA on CPAP, persistent A-fib, on Toprol-XL and Eliquis, CHF, hx of osteomyelitis and Bell's Palsy, 3 of recurrent A-fib in the past for which he required cardioversion, most recent during [facility] stay at [facility] April 2022), super morbid obesity who is being seen 12/29/2022 for the evaluation of sob, weight gain 40lbs, palpitations at the request of Dr Timothy [facility]ias.  History of Present Illness:   Timothy Macias is a 62 y.o. male with a hx of HFmrEF (45%), OSA on CPAP, persistent A-fib, on Toprol-XL and Eliquis, CHF, hx of osteomyelitis and Bell's Palsy, 3 of recurrent A-fib in the past for which he required cardioversion, most recent during [facility]ias stay at [facility] April 2022), super morbid obesity who is being seen 12/29/2022 for the evaluation of sob, weight gain 40lbs, palpitations a  Patient was transferred from [facility] to [facility] for further management of atrial fibrillation. He presented on 12/28/2022 with complaints of shortness of breath, weight gain 40 pounds over the last couple of weeks, reports compliant with medication however still regained weight and intermittent heart palpitations.  Known atrial fibrillation and has been on amiodarone, Eliquis and metoprolol.  Timothy [facility]ias he was hypoxic needing 3 to 4 L oxygen, EKG shows right bundle branch block, A-fib with RVR with heart rates almost to 160s/170s Potassium was 3.5, creatinine is 2.44.And has baseline creatinine about 1.1 from April 2023.  TSH last checked was April 2023 was 3.1   OSH Cardiology offive visit 11/20223 Timothy Macias was diagnosed with heart failure in April 2020 after presenting  to the emergency department with symptoms of dyspnea on exertion and orthopnea. He was started on diuretics with good response. He was diagnosed with atrial fibrillation in 2016 after which he underwent electrical cardioversion. He had a second episode of atrial fibrillation in 2017 he underwent a second cardioversion but unfortunately this did not last too long at which time a rate control strategy was pursued. He had atrial fibrillation ablation at [facility]. Most recently he was hospitalized (03/2022) due to Afib with RVR and hemodynamic instability. DCCV was tried but failed, he later converted on Amiodarone with subsequent loading and continuance of daily 400 mg dosage. Entresto was discontinued at that time and started on low dose ARB instead.  Status post ablation 12/07/2019   Relevant CV Studies: ECHO: 4/23 Summary   1. Technically difficult study.    2. The left ventricle is normal in size with normal wall thickness.    3. The left ventricular systolic function is probably borderline, LVEF is  visually estimated at 50-55%.    4. The left atrium is at least moderately dilated in size.    5. The right atrium is moderately dilated  in size.    6. The right ventricle is moderately dilated, with moderately reduced  systolic function.    7. There is at least mild pulmonary hypertension.   SINUS RHYTHM WITH SINUS ARRHYTHMIA  LEFT AXIS DEVIATION  RIGHT BUNDLE BRANCH BLOCK  ABNORMAL ECG  WHEN COMPARED WITH ECG OF 11-Apr-2022 08:26,  PREMATURE ATRIAL BEATS ARE NO LONGER PRESENT  QRS AXIS SHIFTED LEFT  T WAVE INVERSION  NO LONGER EVIDENT IN INFERIOR LEADS  Confirmed by Timothy [facility]ias ([id number]) on 04/15/2022 6:44:35 PM   [facility]ias Course: [name] with hx of HFmrEF (45%), tobacco use disorder, OSA, persistent A-fib, hypotension, hx of osteomyelitis and Bell's Palsy, presenting with A-fib with RVR to [facility] ED. He underwent cardioversion with [id number] without return to NSR. He received amiodarone gtt which  caused improve HR with hypotension, requiring Levophed. He was transferred to [facility] for further management of arrhythmia.  Atrial Fibrillation with RVR  S/p DCCV x2 for RVR with hypotension during this admission. He did not convert to sinus at OSH. He was then started on amiodarone with better control of his rates. On arrival, HR were 100s while on amiodarone. Home Eliquis was continued during this admission with no plan for repeat DCCV since he has permanent Afib and was unsuccessful at OSH. He was loaded with Amiodarone 400mg  TID and continued on daily 400mg . On HOD3, he was noted to have a mostly regular rhythm with occasional PVCs and overall hemodynamically stable. Etiology surrounding permanent A-fib remains unclear and given polyneuopathy and concern for infiltrative cardiomyopathy he was recommended for cardiac MRI at discharge. Alpha-gal was pending at discharge.  HFrEF 45% 2/2 NICM Patient reports significant alcohol consumption history (at least 1 6-pack beers daily for nearly 45 years with more some days) without liver involvement. Historically, EF has been difficult to estimate due to body habitus (MUGA scan 2021 showed EF 70.4%, reduced to 45% on Echo 06/2021). ProBNP was elevated to 3745 during this admission with CXR showing cardiomegaly with pulmonary vascular congestion. Lactate was initially elevated to 2.0 and is downtrending. Suspect heart failure exacerbation preceded Afib with RVR. Managed with Entresto, Metoprolol, diuresis, Thiamine while inpatient.   Past Medical History:  Diagnosis Date   Atrial fibrillation (HCC)    CHF (congestive heart failure) (HCC)    Fatty liver    Hypertension    Sleep apnea    wears CPAP    Past Surgical History:  Procedure Laterality Date   HIP ARTHROPLASTY Right    KNEE ARTHROPLASTY Right    TONSILLECTOMY     WISDOM TOOTH EXTRACTION       Home Medications:  Prior to Admission medications   Medication Sig Start Date End Date Taking?  Authorizing Provider  metoprolol succinate (TOPROL-XL) 25 MG 24 hr tablet Take 75 mg by mouth daily. 12/10/22  Yes Provider, Historical, Timothy  pregabalin (LYRICA) 100 MG capsule Take 100 mg by mouth 2 (two) times daily. 04/15/22 11/20/32 Yes Provider, Historical, Timothy  apixaban (ELIQUIS) 5 MG TABS tablet Take 5 mg by mouth daily. 05/20/18   Provider, Historical, Timothy  Cholecalciferol (VITAMIN D3) 25 MCG (1000 UT) CAPS Take 1 capsule by mouth daily.    Provider, Historical, Timothy  Coenzyme Q10 10 MG capsule Take 1 capsule by mouth daily.    Provider, Historical, Timothy  cyclobenzaprine (FLEXERIL) 10 MG tablet Take 20 mg by mouth at bedtime. 03/27/19   Provider, Historical, Timothy  digoxin (LANOXIN) 0.125 MG tablet 0.125 mg.  08/19/19   Provider, Historical, Timothy  diphenhydramine-acetaminophen (TYLENOL PM) 25-500 MG TABS tablet Take 2 tablets by mouth at bedtime as needed.    Provider, Historical, Timothy  dofetilide (TIKOSYN) 500 MCG capsule Take by mouth. Patient not taking: Reported on 02/15/2022 12/07/19   Provider, Historical, Timothy  DULoxetine (CYMBALTA) 60 MG capsule Take 60 mg by mouth 2 (two) times daily. 05/04/19   Provider, Historical, Timothy  furosemide (LASIX) 80 MG tablet Take 80  mg by mouth daily. 02/02/19   Provider, Historical, Timothy  gabapentin (NEURONTIN) 300 MG capsule Takes 100mg  at hs 01/29/19   Provider, Historical, Timothy  lisinopril (ZESTRIL) 5 MG tablet Take 2.5 mg by mouth daily. 06/16/19   Provider, Historical, Timothy  LORazepam (ATIVAN) 0.5 MG tablet Take 0.5 mg by mouth 3 (three) times daily. 03/18/19   Provider, Historical, Timothy  melatonin 5 MG TABS Take 2-3 tablets by mouth at bedtime as needed.    Provider, Historical, Timothy  Multiple Vitamins-Minerals (YOUR LIFE MULTI ADULT GUMMIES) CHEW Chew by mouth.    Provider, Historical, Timothy  Oxycodone HCl 20 MG TABS Take 3 tablets by mouth daily. Takes 3 tablets 3 times a day 06/22/19   Provider, Historical, Timothy  pantoprazole (PROTONIX) 40 MG tablet Take 1 tablet by mouth 2 (two) times  daily. 06/03/19   Provider, Historical, Timothy  potassium chloride SA (K-DUR) 20 MEQ tablet Take 2 tablets by mouth daily. 04/02/19   Provider, Historical, Timothy  predniSONE (DELTASONE) 20 MG tablet Take 10 mg by mouth daily.  Patient not taking: Reported on 01/10/2021 06/03/19   Provider, Historical, Timothy  promethazine (PHENERGAN) 25 MG tablet Take 25 mg by mouth every 4 (four) hours as needed. 06/03/19   Provider, Historical, Timothy  SUMAtriptan (IMITREX) 100 MG tablet Take 100 mg by mouth as directed. 02/10/16   Provider, Historical, Timothy  tamsulosin (FLOMAX) 0.4 MG CAPS capsule Take 0.4 mg by mouth daily. 06/03/19   Provider, Historical, Timothy  testosterone cypionate (DEPOTESTOSTERONE CYPIONATE) 200 MG/ML injection INJECT ONE ML INTRAMUSCULARLY TWICE A WEEK FOR LOW TESTOSTERONE. 06/03/19   Provider, Historical, Timothy  torsemide (DEMADEX) 100 MG tablet Take 100 mg by mouth 2 (two) times daily. 12/12/20   Provider, Historical, Timothy    Inpatient Medications: Scheduled Meds:  amiodarone  150 mg Intravenous Once   apixaban  5 mg Oral BID   digoxin  0.25 mg Intravenous Once   furosemide  80 mg Intravenous BID   metoprolol succinate  25 mg Oral BID   metoprolol tartrate  12.5 mg Oral Q6H   pantoprazole  40 mg Oral BID   pregabalin  100 mg Oral Daily   tamsulosin  0.4 mg Oral Daily   Continuous Infusions:  sodium chloride 75 mL/hr at 12/29/22 1707   promethazine (PHENERGAN) injection (IM or IVPB)     PRN Meds: acetaminophen, ALPRAZolam, ondansetron (ZOFRAN) IV, oxyCODONE, promethazine (PHENERGAN) injection (IM or IVPB)  Allergies:    Allergies  Allergen Reactions   Albuterol Swelling   Amiodarone Rash   Penicillins Hives    Social History:   Social History   Socioeconomic History   Marital status: Married    Spouse name: 02/27/23   Number of children: Not on file   Years of education: Not on file   Highest education level: Some college, no degree  Occupational History   Not on file  Tobacco Use   Smoking  status: Former    Packs/day: 1.00    Types: Cigarettes   Smokeless tobacco: Never  Vaping Use   Vaping Use: Not on file  Substance and Sexual Activity   Alcohol use: Not Currently   Drug use: Never   Sexual activity: Not on file  Other Topics Concern   Not on file  Social History Narrative   Patient is right-handed. He lives with his wife in a one level home. He drinks 1-2 diet sodas a day caffeine free. He does not exercise. On disability   Social  Determinants of Health   Financial Resource Strain: Not on file  Food Insecurity: Not on file  Transportation Needs: Not on file  Physical Activity: Not on file  Stress: Not on file  Social Connections: Not on file  Intimate Partner Violence: Not on file    Family History:    Family History  Problem Relation Age of Onset   Bone cancer Mother    Other Father        MVA     ROS:  Please see the history of present illness.   All other ROS reviewed and negative.     Physical Exam/Data:   Vitals:   12/29/22 1647 12/29/22 1848 12/29/22 1854 12/29/22 1900  BP: 104/67 (!) 144/110 115/85 115/85  Pulse: (!) 48   (!) 143  Resp: 17 (!) 24 18   Temp:      TempSrc:      SpO2: 90%  90%     Intake/Output Summary (Last 24 hours) at 12/29/2022 1922 Last data filed at 12/29/2022 0853 Gross per 24 hour  Intake --  Output 950 ml  Net -950 ml      02/15/2022   11:09 AM 11/08/2021   12:40 PM 01/10/2021    8:04 AM  Last 3 Weights  Weight (lbs) 306 lb 293 lb 344 lb  Weight (kg) 138.801 kg 132.904 kg 156.037 kg     There is no height or weight on file to calculate BMI.  General:  Well nourished, well developed, in no acute distress HEENT: normal Neck: Difficult to appreciate JVD given morbid obesity Vascular: No carotid bruits; Distal pulses 2+ bilaterally Cardiac: Distant heart sounds, irregularly irregular Lungs: Decreased lung sounds Abd: soft, nontender, no hepatomegaly  Ext: no edema Musculoskeletal:  No deformities, BUE  and BLE strength normal and equal Skin: warm and dry  Neuro:  CNs 2-12 intact, no focal abnormalities noted Psych:  Normal affect   EKG:  The EKG was personally reviewed and demonstrates: Atrial fibrillation with RVR, heart rate 148 bpm, right bundle branch block Telemetry:  Telemetry was personally reviewed and demonstrates: Atrial fibrillation with heart rate of 150 bpm  Relevant CV Studies: OSH records see Care Everywhere  Laboratory Data:  High Sensitivity Troponin:   Recent Labs  Lab 12/28/22 1400 12/28/22 1620 12/29/22 1556 12/29/22 1713  TROPONINIHS 9 8 7 7      Chemistry Recent Labs  Lab 12/28/22 1334 12/28/22 1335 12/29/22 0552  NA  --  133* 135  K  --  3.5 3.6  CL  --  90* 90*  CO2  --  29 28  GLUCOSE  --  112* 137*  BUN  --  47* 54*  CREATININE  --  2.40* 2.36*  CALCIUM  --  8.9 8.3*  MG 1.8  --   --   GFRNONAA  --  30* 31*  ANIONGAP  --  14 17*    Recent Labs  Lab 12/28/22 1335  PROT 7.4  ALBUMIN 3.7  AST 19  ALT 14  ALKPHOS 73  BILITOT 1.3*   Lipids No results for input(s): "CHOL", "TRIG", "HDL", "LABVLDL", "LDLCALC", "CHOLHDL" in the last 168 hours.  Hematology Recent Labs  Lab 12/28/22 1335 12/29/22 0552  WBC 7.8 5.5  RBC 6.59* 6.47*  HGB 17.3* 16.9  HCT 55.3* 54.6*  MCV 83.9 84.4  MCH 26.3 26.1  MCHC 31.3 31.0  RDW 20.7* 21.1*  PLT 252 218   Thyroid No results for input(s): "TSH", "FREET4" in the  last 168 hours.  BNP Recent Labs  Lab 12/28/22 1335 12/29/22 0552  BNP 179.0* 178.0*    DDimer No results for input(s): "DDIMER" in the last 168 hours.   Radiology/Studies:  DG Chest Port 1 View  Result Date: 12/28/2022 CLINICAL DATA:  Substantial weight loss over several days. Atrial fibrillation. Upper abdominal pain. EXAM: PORTABLE CHEST 1 VIEW COMPARISON:  04/10/2022 FINDINGS: Mild enlargement of the cardiopericardial silhouette, without findings of acute pulmonary edema. Poor definition left hemidiaphragm which is probably  related to apical lordotic projection, cannot exclude left lower lobe airspace opacity. Correlate with chest auscultation. The lungs appear otherwise clear.  Mild lower thoracic spondylosis. IMPRESSION: 1. Mild enlargement of the cardiopericardial silhouette, without edema. 2. Poor definition of the left hemidiaphragm which is probably related to apical lordotic projection, cannot exclude left lower lobe airspace opacity. Correlate with chest auscultation. Electronically Signed   By: Gaylyn Rong M.D.   On: 12/28/2022 13:56     Assessment and Plan:   Permanent Afib RVR. Prior h/o ablatino, dccv- chronically mx on amiodarone+eliquis Ac on chronic Hfmidrange EF (50%), 40lbs weight gain AKI likely secondary to cardiorenal syndrome, creatinine 2.4, baseline creatinine of 1.1 Acute hypoxic respiratory failure from pulmonary edema Super morbid obesity OSA on CPAP H/o ETOH use Comorbidities as listed   Plan: -At OSH, EMT wanted to cardiovert him given his symptoms of nausea, vomiting, palpitations and heart rate of 150 bpm however patient is markedly volume overloaded needing 4 L oxygen, warm extremities, good blood pressure.  Felt that he would be at more decompensation of his heart failure, hypoxia if he did sedation and cardioversion and given his history unlikely to remain in sinus rhythm.  Please note his admission in April 2023 at [facility] was for A-fib RVR, heart failure exacerbation, EF of 45% needing urgent cardioversion x 2 but failed, received amiodarone, hypotension requiring Levophed, diuresis and then sent home on diuretics and amiodarone and GDMT.  -Give IV digoxin 500 mcg now, in 6 hours repeat 250 mcg.  Initiate metoprolol 25 twice daily, continue amiodarone IV drip. -Continue home Eliquis 5 mg twice daily -Significantly volume overloaded, give IV Lasix 80 mg twice daily assess response if suboptimal initiate IV Lasix drip -CPAP at nighttime -Replace electrolytes -Repeat  echocardiogram when rates are controlled.  Unfortunately patient with permanent A-fib and no good solution-would likely have to be left on amiodarone.  Continue to follow along Risk Assessment/Risk Scores:        New York Heart Association (NYHA) Functional Class NYHA Class IV  CHA2DS2-VASc Score =   3  This indicates a  % annual risk of stroke. The patient's score is based upon:           For questions or updates, please contact [facility] Please consult [email or web address] for contact info under    Signed, Elmon Kirschner, Timothy  12/29/2022 7:22 PM

## 2022-12-29 NOTE — TOC Progression Note (Signed)
  Transition of Care [facility]) Screening Note   Patient Details  Name: Bryston Colocho Date of Birth: [birth year]   Transition of [facility]) CM/SW Contact:    Boneta Lucks, RN Phone Number: 12/29/2022, 11:39 AM    Transition of Care Department [facility]) has reviewed patient and no TOC needs have been identified at this time. We will continue to monitor patient advancement through interdisciplinary progression rounds. If new patient transition needs arise, please place a TOC consult.

## 2022-12-29 NOTE — ED Notes (Signed)
Amiodarone drip restarted per MD.

## 2022-12-29 NOTE — ED Notes (Signed)
MD notified that HR continues to be elevated in the 150s

## 2022-12-30 ENCOUNTER — Inpatient Hospital Stay (HOSPITAL_COMMUNITY): Payer: BC Managed Care – PPO

## 2022-12-30 ENCOUNTER — Encounter (HOSPITAL_COMMUNITY): Payer: Self-pay | Admitting: Internal Medicine

## 2022-12-30 DIAGNOSIS — I5032 Chronic diastolic (congestive) heart failure: Secondary | ICD-10-CM

## 2022-12-30 DIAGNOSIS — J9601 Acute respiratory failure with hypoxia: Secondary | ICD-10-CM | POA: Diagnosis not present

## 2022-12-30 DIAGNOSIS — K56609 Unspecified intestinal obstruction, unspecified as to partial versus complete obstruction: Secondary | ICD-10-CM

## 2022-12-30 DIAGNOSIS — I482 Chronic atrial fibrillation, unspecified: Secondary | ICD-10-CM | POA: Diagnosis not present

## 2022-12-30 DIAGNOSIS — N179 Acute kidney failure, unspecified: Secondary | ICD-10-CM | POA: Diagnosis not present

## 2022-12-30 DIAGNOSIS — I4891 Unspecified atrial fibrillation: Secondary | ICD-10-CM | POA: Diagnosis not present

## 2022-12-30 DIAGNOSIS — J9602 Acute respiratory failure with hypercapnia: Secondary | ICD-10-CM

## 2022-12-30 DIAGNOSIS — G4733 Obstructive sleep apnea (adult) (pediatric): Secondary | ICD-10-CM | POA: Diagnosis not present

## 2022-12-30 LAB — BRAIN NATRIURETIC PEPTIDE: B Natriuretic Peptide: 173.4 pg/mL — ABNORMAL HIGH (ref 0.0–100.0)

## 2022-12-30 LAB — COMPREHENSIVE METABOLIC PANEL
ALT: 13 U/L (ref 0–44)
AST: 22 U/L (ref 15–41)
Albumin: 3.2 g/dL — ABNORMAL LOW (ref 3.5–5.0)
Alkaline Phosphatase: 51 U/L (ref 38–126)
Anion gap: 15 (ref 5–15)
BUN: 60 mg/dL — ABNORMAL HIGH (ref 8–23)
CO2: 30 mmol/L (ref 22–32)
Calcium: 8.3 mg/dL — ABNORMAL LOW (ref 8.9–10.3)
Chloride: 86 mmol/L — ABNORMAL LOW (ref 98–111)
Creatinine, Ser: 2.46 mg/dL — ABNORMAL HIGH (ref 0.61–1.24)
GFR, Estimated: 29 mL/min — ABNORMAL LOW (ref >60)
Glucose, Bld: 114 mg/dL — ABNORMAL HIGH (ref 70–99)
Potassium: 3 mmol/L — ABNORMAL LOW (ref 3.5–5.1)
Sodium: 131 mmol/L — ABNORMAL LOW (ref 135–145)
Total Bilirubin: 1.2 mg/dL (ref 0.3–1.2)
Total Protein: 6.7 g/dL (ref 6.5–8.1)

## 2022-12-30 LAB — CBC
HCT: 59.1 % — ABNORMAL HIGH (ref 39.0–52.0)
Hemoglobin: 18.4 g/dL — ABNORMAL HIGH (ref 13.0–17.0)
MCH: 26 pg (ref 26.0–34.0)
MCHC: 31.1 g/dL (ref 30.0–36.0)
MCV: 83.6 fL (ref 80.0–100.0)
Platelets: 240 10*3/uL (ref 150–400)
RBC: 7.07 MIL/uL — ABNORMAL HIGH (ref 4.22–5.81)
RDW: 21.1 % — ABNORMAL HIGH (ref 11.5–15.5)
WBC: 3.7 10*3/uL — ABNORMAL LOW (ref 4.0–10.5)
nRBC: 0.5 % — ABNORMAL HIGH (ref 0.0–0.2)

## 2022-12-30 LAB — ECHOCARDIOGRAM COMPLETE
Area-P 1/2: 5.16 cm2
MV M vel: 1.58 m/s
MV Peak grad: 10 mmHg
S' Lateral: 3.6 cm
Weight: 6236.8 oz

## 2022-12-30 LAB — BASIC METABOLIC PANEL
Anion gap: 20 — ABNORMAL HIGH (ref 5–15)
Anion gap: 16 — ABNORMAL HIGH (ref 5–15)
BUN: 57 mg/dL — ABNORMAL HIGH (ref 8–23)
BUN: 61 mg/dL — ABNORMAL HIGH (ref 8–23)
CO2: 28 mmol/L (ref 22–32)
CO2: 33 mmol/L — ABNORMAL HIGH (ref 22–32)
Calcium: 8.5 mg/dL — ABNORMAL LOW (ref 8.9–10.3)
Calcium: 8.4 mg/dL — ABNORMAL LOW (ref 8.9–10.3)
Chloride: 85 mmol/L — ABNORMAL LOW (ref 98–111)
Chloride: 85 mmol/L — ABNORMAL LOW (ref 98–111)
Creatinine, Ser: 2.39 mg/dL — ABNORMAL HIGH (ref 0.61–1.24)
Creatinine, Ser: 2.35 mg/dL — ABNORMAL HIGH (ref 0.61–1.24)
GFR, Estimated: 30 mL/min — ABNORMAL LOW (ref >60)
GFR, Estimated: 31 mL/min — ABNORMAL LOW (ref >60)
Glucose, Bld: 121 mg/dL — ABNORMAL HIGH (ref 70–99)
Glucose, Bld: 113 mg/dL — ABNORMAL HIGH (ref 70–99)
Potassium: 5.2 mmol/L — ABNORMAL HIGH (ref 3.5–5.1)
Potassium: 3.4 mmol/L — ABNORMAL LOW (ref 3.5–5.1)
Sodium: 133 mmol/L — ABNORMAL LOW (ref 135–145)
Sodium: 134 mmol/L — ABNORMAL LOW (ref 135–145)

## 2022-12-30 LAB — GLUCOSE, CAPILLARY
Glucose-Capillary: 103 mg/dL — ABNORMAL HIGH (ref 70–99)
Glucose-Capillary: 114 mg/dL — ABNORMAL HIGH (ref 70–99)
Glucose-Capillary: 114 mg/dL — ABNORMAL HIGH (ref 70–99)
Glucose-Capillary: 140 mg/dL — ABNORMAL HIGH (ref 70–99)

## 2022-12-30 LAB — CBC WITH DIFFERENTIAL/PLATELET
Abs Immature Granulocytes: 0.03 10*3/uL (ref 0.00–0.07)
Basophils Absolute: 0 10*3/uL (ref 0.0–0.1)
Basophils Relative: 1 %
Eosinophils Absolute: 0 10*3/uL (ref 0.0–0.5)
Eosinophils Relative: 0 %
HCT: 55.8 % — ABNORMAL HIGH (ref 39.0–52.0)
Hemoglobin: 17.9 g/dL — ABNORMAL HIGH (ref 13.0–17.0)
Immature Granulocytes: 1 %
Lymphocytes Relative: 10 %
Lymphs Abs: 0.4 10*3/uL — ABNORMAL LOW (ref 0.7–4.0)
MCH: 26.4 pg (ref 26.0–34.0)
MCHC: 32.1 g/dL (ref 30.0–36.0)
MCV: 82.4 fL (ref 80.0–100.0)
Monocytes Absolute: 0.7 10*3/uL (ref 0.1–1.0)
Monocytes Relative: 20 %
Neutro Abs: 2.5 10*3/uL (ref 1.7–7.7)
Neutrophils Relative %: 68 %
Platelets: 212 10*3/uL (ref 150–400)
RBC: 6.77 MIL/uL — ABNORMAL HIGH (ref 4.22–5.81)
RDW: 20.6 % — ABNORMAL HIGH (ref 11.5–15.5)
WBC: 3.7 10*3/uL — ABNORMAL LOW (ref 4.0–10.5)
nRBC: 0 % (ref 0.0–0.2)

## 2022-12-30 LAB — MAGNESIUM: Magnesium: 2.1 mg/dL (ref 1.7–2.4)

## 2022-12-30 LAB — BLOOD GAS, ARTERIAL
Acid-Base Excess: 12.6 mmol/L — ABNORMAL HIGH (ref 0.0–2.0)
Bicarbonate: 40.6 mmol/L — ABNORMAL HIGH (ref 20.0–28.0)
O2 Saturation: 97.8 %
Patient temperature: 37
pCO2 arterial: 67 mmHg (ref 32–48)
pH, Arterial: 7.39 (ref 7.35–7.45)
pO2, Arterial: 85 mmHg (ref 83–108)

## 2022-12-30 LAB — LACTIC ACID, PLASMA
Lactic Acid, Venous: 1.6 mmol/L (ref 0.5–1.9)
Lactic Acid, Venous: 1 mmol/L (ref 0.5–1.9)

## 2022-12-30 LAB — POCT I-STAT 7, (LYTES, BLD GAS, ICA,H+H)
Acid-Base Excess: 6 mmol/L — ABNORMAL HIGH (ref 0.0–2.0)
Bicarbonate: 35.5 mmol/L — ABNORMAL HIGH (ref 20.0–28.0)
Calcium, Ion: 1.1 mmol/L — ABNORMAL LOW (ref 1.15–1.40)
HCT: 54 % — ABNORMAL HIGH (ref 39.0–52.0)
Hemoglobin: 18.4 g/dL — ABNORMAL HIGH (ref 13.0–17.0)
O2 Saturation: 99 %
Potassium: 3.2 mmol/L — ABNORMAL LOW (ref 3.5–5.1)
Sodium: 132 mmol/L — ABNORMAL LOW (ref 135–145)
TCO2: 38 mmol/L — ABNORMAL HIGH (ref 22–32)
pCO2 arterial: 68.8 mmHg (ref 32–48)
pH, Arterial: 7.321 — ABNORMAL LOW (ref 7.35–7.45)
pO2, Arterial: 136 mmHg — ABNORMAL HIGH (ref 83–108)

## 2022-12-30 LAB — TSH: TSH: 0.657 u[IU]/mL (ref 0.350–4.500)

## 2022-12-30 LAB — PHOSPHORUS: Phosphorus: 4.8 mg/dL — ABNORMAL HIGH (ref 2.5–4.6)

## 2022-12-30 LAB — MRSA NEXT GEN BY PCR, NASAL: MRSA by PCR Next Gen: NOT DETECTED

## 2022-12-30 LAB — D-DIMER, QUANTITATIVE: D-Dimer, Quant: 3.47 ug/mL-FEU — ABNORMAL HIGH (ref 0.00–0.50)

## 2022-12-30 MED ORDER — ARFORMOTEROL TARTRATE 15 MCG/2ML IN NEBU
15.0000 ug | INHALATION_SOLUTION | Freq: Two times a day (BID) | RESPIRATORY_TRACT | Status: DC
  Administered 2022-12-30 – 2023-01-03 (×9): 15 ug via RESPIRATORY_TRACT
  Filled 2022-12-30 (×9): qty 2

## 2022-12-30 MED ORDER — NALOXONE HCL 0.4 MG/ML IJ SOLN: 0.4000 mg | Freq: Once | INTRAMUSCULAR | Status: DC

## 2022-12-30 MED ORDER — METOPROLOL SUCCINATE ER 50 MG PO TB24: 50.0000 mg | ORAL_TABLET | Freq: Two times a day (BID) | ORAL | Status: DC

## 2022-12-30 MED ORDER — SODIUM CHLORIDE 0.9 % IV BOLUS
250.0000 mL | Freq: Once | INTRAVENOUS | Status: AC
  Administered 2022-12-30: 250 mL via INTRAVENOUS

## 2022-12-30 MED ORDER — ACETAMINOPHEN 10 MG/ML IV SOLN
1000.0000 mg | Freq: Once | INTRAVENOUS | Status: AC
  Administered 2022-12-30: 1000 mg via INTRAVENOUS
  Filled 2022-12-30: qty 100

## 2022-12-30 MED ORDER — INSULIN ASPART 100 UNIT/ML IJ SOLN
1.0000 [IU] | INTRAMUSCULAR | Status: DC
  Administered 2023-01-03 – 2023-01-06 (×3): 1 [IU] via SUBCUTANEOUS

## 2022-12-30 MED ORDER — HEPARIN (PORCINE) 25000 UT/250ML-% IV SOLN
2100.0000 [IU]/h | INTRAVENOUS | Status: DC
  Administered 2022-12-30: 1700 [IU]/h via INTRAVENOUS
  Administered 2022-12-31 – 2023-01-01 (×3): 2100 [IU]/h via INTRAVENOUS
  Filled 2022-12-30 (×5): qty 250

## 2022-12-30 MED ORDER — PANTOPRAZOLE SODIUM 40 MG IV SOLR
40.0000 mg | INTRAVENOUS | Status: DC
  Administered 2022-12-30 – 2023-01-07 (×9): 40 mg via INTRAVENOUS
  Filled 2022-12-30 (×9): qty 10

## 2022-12-30 MED ORDER — LACTATED RINGERS IV BOLUS
500.0000 mL | Freq: Once | INTRAVENOUS | Status: AC
  Administered 2022-12-30: 500 mL via INTRAVENOUS

## 2022-12-30 MED ORDER — REVEFENACIN 175 MCG/3ML IN SOLN
175.0000 ug | Freq: Every day | RESPIRATORY_TRACT | Status: DC
  Administered 2022-12-31 – 2023-01-08 (×9): 175 ug via RESPIRATORY_TRACT
  Filled 2022-12-30 (×9): qty 3

## 2022-12-30 MED ORDER — SODIUM CHLORIDE 0.9 % IV SOLN: INTRAVENOUS | Status: DC

## 2022-12-30 MED ORDER — POTASSIUM CHLORIDE 10 MEQ/100ML IV SOLN
10.0000 meq | INTRAVENOUS | Status: AC
  Administered 2022-12-30 (×6): 10 meq via INTRAVENOUS
  Filled 2022-12-30 (×3): qty 100

## 2022-12-30 MED ORDER — AMIODARONE LOAD VIA INFUSION
150.0000 mg | Freq: Once | INTRAVENOUS | Status: AC
  Administered 2022-12-30: 150 mg via INTRAVENOUS
  Filled 2022-12-30: qty 83.34

## 2022-12-30 MED ORDER — SODIUM CHLORIDE 0.9 % IV SOLN: INTRAVENOUS | Status: AC

## 2022-12-30 MED ORDER — CHLORHEXIDINE GLUCONATE CLOTH 2 % EX PADS
6.0000 | MEDICATED_PAD | Freq: Every day | CUTANEOUS | Status: DC
  Administered 2022-12-30 – 2023-01-08 (×10): 6 via TOPICAL

## 2022-12-30 MED ORDER — BUDESONIDE 0.25 MG/2ML IN SUSP
0.2500 mg | Freq: Two times a day (BID) | RESPIRATORY_TRACT | Status: DC
  Administered 2022-12-30: 0.25 mg via RESPIRATORY_TRACT
  Filled 2022-12-30: qty 2

## 2022-12-30 MED ORDER — IOHEXOL 9 MG/ML PO SOLN
500.0000 mL | ORAL | Status: AC
  Administered 2022-12-30 (×2): 500 mL via ORAL

## 2022-12-30 NOTE — Progress Notes (Signed)
Per [facility] report, NGT advance with external length now 49cm vs previous length of 60cm. Dr. Carlis Abbott aware of advancement.

## 2022-12-30 NOTE — Progress Notes (Addendum)
[place] Progress Note Patient Name: Timothy Macias DOB: [birth year] MRN: [id number]   Date of Service  12/30/2022  HPI/Events of Note  Belly pain from SBO. No difference in exam. On BIPAP. Notes mention plan to avoid opioids. Does not have any pain medications. Liver enzymes ok on last check. NPO.   eICU Interventions  IV tylenol x  1     Intervention Category Major Interventions: Respiratory failure - evaluation and management  Francia Verry G Meliah Appleman 12/30/2022, 7:45 PM  Addendum at 11 pm - Had reviewed results of CT head and abdomen. Paged surgery Dr Annye English. No plans for acute surgery at this time and keep NPO, continue bipap, pain control. D/w RN. I was told patient has put out around 7 liters from his NG tube. Is on NS at 100 cc/hr. Will increase this to 150 cc/hr. Notified of no urine output. Will bladder scan. ABG noted. Lower Fio2 on BIPAP. O2 sat is 97. Will also do a CXR to see if he has pulm edema.   Addendum at 1:15 am - Initial bladder scan did not pick up any output. Foley attempted but was difficult to place and notified now that patient has voided around 1 liter urine in last few hours. NG output has reduced in last couple hours. Total NG output close to 7 liter. Fio2 down to 60. CXR with some pulm vasculature prominence but no overt edema. Difficult to assess volume status but clearly has had lot of GI losses. Will do a LR bolus 500 cc over 1 hour. D/w RN. Hold bolus if he gets congested or desaturates. Let us know NG output and urine output in the next 2 hours as well.  Addendum at 5:10 am - Notified that patient desaturated and sounds congested now. BIPAP fio2 was increased to 70%. O2 sat is currently 97. Tachypnea though not in overt distress. Remains in rapid A fib with HR 140 (had come down earlier). Very difficult to assess volume status - echo was not a good view , only has PIV at this time. Will hold fluids. In addition, was just notified that the NG output  appears feculent though RN not sure about this. Have asked ground team to evaluate this.   Addendum at 6:55 am - Patient was evaluated  by CCM. Output not felt to be feculent per bedside assessment. Hemodynamics otherwise unchanged. Fully awake and alert in no distress on camera. Day team to follow up. Leaving fluids off for now since RN felt he got congested.

## 2022-12-30 NOTE — Progress Notes (Signed)
ANTICOAGULATION CONSULT NOTE - Initial Consult  Pharmacy Consult for heparin Indication: atrial fibrillation  Allergies  Allergen Reactions   Albuterol Swelling   Amiodarone Rash   Penicillins Hives    Patient Measurements: Height: 5\' 8"  (172.7 cm) Weight: (!) 176.8 kg (389 lb 12.8 oz) IBW/kg (Calculated) : 68.4 Heparin Dosing Weight: 113kg  Vital Signs: Temp: 97.7 F (36.5 C) (01/07 1336) Temp Source: Axillary (01/07 1336) BP: 120/88 (01/07 1440) Pulse Rate: 66 (01/07 1355)  Labs: Recent Labs    12/28/22 1620 12/29/22 0552 12/29/22 1556 12/29/22 1713 12/30/22 0145 12/30/22 0546 12/30/22 1416 12/30/22 1512  HGB  --  16.9  --   --   --  18.4*  --  17.9*  HCT  --  54.6*  --   --   --  59.1*  --  55.8*  PLT  --  218  --   --   --  240  --  212  CREATININE  --  2.36*  --   --  2.39*  --  2.35* 2.46*  TROPONINIHS 8  --  7 7  --   --   --   --     Estimated Creatinine Clearance: 49.9 mL/min (A) (by C-G formula based on SCr of 2.46 mg/dL (H)).   Medical History: Past Medical History:  Diagnosis Date   Atrial fibrillation (HCC)    CHF (congestive heart failure) (HCC)    Fatty liver    Hypertension    Sleep apnea    wears CPAP    Medications:  Medications Prior to Admission  Medication Sig Dispense Refill Last Dose   acetaminophen (TYLENOL) 500 MG tablet Take 1,000 mg by mouth every 8 (eight) hours as needed for mild pain.   Past Month   amiodarone (PACERONE) 200 MG tablet Take 400 mg by mouth daily.   12/28/2022 at 1400   apixaban (ELIQUIS) 5 MG TABS tablet Take 5 mg by mouth daily.   12/28/2022 at 0800   cyclobenzaprine (FLEXERIL) 10 MG tablet Take 10 mg by mouth 3 (three) times daily as needed for muscle spasms.   Past Week   diphenhydramine-acetaminophen (TYLENOL PM) 25-500 MG TABS tablet Take 2 tablets by mouth at bedtime as needed.   12/28/2022   DULoxetine (CYMBALTA) 60 MG capsule Take 60 mg by mouth 2 (two) times daily.   12/28/2022   ferrous sulfate 325 (65  FE) MG EC tablet Take 325 mg by mouth daily with breakfast.   12/28/2022   folic acid (FOLVITE) 1 MG tablet Take 1 mg by mouth daily.   12/28/2022   furosemide (LASIX) 80 MG tablet Take 80 mg by mouth daily.   12/28/2022   LORazepam (ATIVAN) 0.5 MG tablet Take 0.5 mg by mouth every 8 (eight) hours as needed for anxiety.   12/28/2022   losartan (COZAAR) 25 MG tablet Take 12.5 mg by mouth daily.   12/28/2022   Magnesium 400 MG TABS Take 400 mg by mouth daily.   12/28/2022   melatonin 5 MG TABS Take 5-10 mg by mouth at bedtime as needed (sleep).   Past Month   metoprolol succinate (TOPROL-XL) 25 MG 24 hr tablet Take 75 mg by mouth daily.   12/28/2022 at 0800   Multiple Vitamins-Minerals (YOUR LIFE MULTI ADULT GUMMIES) CHEW Chew by mouth.   12/27/2022   oxycodone (ROXICODONE) 30 MG immediate release tablet Take 60 mg by mouth 3 (three) times daily as needed (pain).   12/28/2022   pantoprazole (PROTONIX)  40 MG tablet Take 1 tablet by mouth 2 (two) times daily.   12/28/2022   potassium chloride SA (K-DUR) 20 MEQ tablet Take 40 mEq by mouth daily.   12/28/2022   pregabalin (LYRICA) 100 MG capsule Take 100 mg by mouth at bedtime.   12/27/2022   promethazine (PHENERGAN) 25 MG tablet Take 25 mg by mouth every 6 (six) hours as needed for nausea or vomiting.   12/28/2022   SUMAtriptan (IMITREX) 100 MG tablet Take 100 mg by mouth every 2 (two) hours as needed for migraine.   unk   tamsulosin (FLOMAX) 0.4 MG CAPS capsule Take 0.8 mg by mouth daily.   12/28/2022   testosterone cypionate (DEPOTESTOSTERONE CYPIONATE) 200 MG/ML injection Inject 200 mg into the muscle once a week.   12/16/2022   Scheduled:   amiodarone  150 mg Intravenous Once   arformoterol  15 mcg Nebulization BID   insulin aspart  1-3 Units Subcutaneous Q4H   naloxone  0.4 mg Intramuscular Once   pantoprazole (PROTONIX) IV  40 mg Intravenous Q24H   pregabalin  100 mg Oral Daily   revefenacin  175 mcg Nebulization Daily   tamsulosin  0.4 mg Oral Daily   Infusions:    sodium chloride 100 mL/hr at 12/30/22 1519   amiodarone 30 mg/hr (12/30/22 0900)   heparin     lactated ringers     potassium chloride      Assessment: Pt with a hx of afib on apixaba who was admitted for possible bowel obstruction. Plan to bridge with heparin for possible surgery. Last dose this AM around 8AM. Expecting heparin level to be elevated due to apixaban. We will use PTT to monitor until correlation.   Scr 2.46 Hgb 17.9 Plt wnl  Goal of Therapy:  Heparin level 0.3-0.7 units/ml aPTT 66-102 seconds Monitor platelets by anticoagulation protocol: Yes   Plan:  Heparin 1700 units/hr start at 2000 HL/APTT in AM Daily PTT/HL/CBC  Onnie Boer, PharmD, BCIDP, AAHIVP, CPP Infectious Disease Pharmacist 12/30/2022 4:54 PM

## 2022-12-30 NOTE — Progress Notes (Signed)
PROGRESS NOTE    Patient: Timothy Macias                            PCP: Caryl Bis, MD                    DOB: 1961/08/21            DOA: 12/28/2022 HCW:[id number]             DOS: 12/30/2022, 11:22 AM   LOS: 2 days   Date of Service: The patient was seen and examined on 12/30/2022  Subjective:  -No chest pain. -No palpitation. -No shortness of breath. -Patient remains in A-fib RVR, with heart rates in the 150s. -Abdominal distention noted.  Brief Narrative:  Patient is a 62 year old male, morbidly obese, with past medical history significant for heart failure with reduced EF (45%), OSA on CPAP, persistent atrial fibrillation on Eliquis, osteomyelitis and history of Bell's palsy.  Patient was admitted with A-fib RVR.  Cardiology team is directing management.  Patient also volunteers 3-day history of abdominal distention.  KUB done this morning (12/30/2022) revealed possible small bowel obstruction with significantly gaseous distention at the stomach and intestine.  NG tube will be inserted.  CT scan of abdomen and pelvis with only oral contrast will be done due to acute kidney injury.  Patient denies any chest pain or shortness of breath.  Also discussed with the surgical team.  Surgical team will see patient in consultation.  Cardiology input is highly appreciated.  Patient's baseline serum creatinine is unknown.      Assessment & Plan:     Principal Problem:   Atrial fibrillation with RVR (HCC) Active Problems:   Essential hypertension   Gastroesophageal reflux disease without esophagitis   Obesity, morbid, BMI 50 or higher (HCC)   OSA on CPAP   AKI (acute kidney injury) ([place]) Possible small bowel obstruction Volume depletion Nausea and vomiting      A-fib with RVR -Patient remains in A-fib RVR.  Heart rate is in the 150s. -Cardiology team is directing care. -Patient is currently on amiodarone drip, Eliquis, metoprolol.   -Gaseous distention of the stomach and colon  noted, with concerns for possible SBO.  Will place NG tube to low suction.  Improved comfort may help with RVR.   -Echo is pending.    AKI -Suspect pre renal.  Cannot rule out ATN in the setting of hemodynamic instability.   -Check urinalysis and urine sodium.   -Renal ultrasound.   -Cautious hydration.   -Avoid nephrotoxins.   -Continue to monitor renal function and electrolytes.   -Keep MAP greater than 65 mmHg.   -Low threshold to consult the nephrology team.      Acute hypoxic respiratory failure / Acute on chronic systolic CHF None ischemic cardiomyopathy -Suspect patient has chronic respiratory failure at baseline. -Patient has history of OSA on CPAP. -Suspect component of obesity hypoventilation syndrome. -History of cigarette use.  Not clear if patient has prior history of COPD. -Supplemental oxygen to keep O2 sat greater than 91%. -Decompressing distal MAC may help with oxygenation. -Patient has allergies to albuterol. -Nebs Pulmicort twice daily. -Further management depend on hospital course.   -Discontinue IV Lasix.  Polyneuropathy -Chronic  pain syndrome Lumbar stenosis -On oxycodone 20 Mg every 6 as needed.  -Continue with Lyrica as well   Obstructive Sleep Apnea  -Continue with CPAP    Morbid obesity -BMI 60.15  kg/m. -Diet and exercise on discharge.  PCP to also consider semaglutide or Mounjaro i.e. if not contraindicated.      DVT prophylaxis:   apixaban (ELIQUIS) tablet 5 mg   Code Status:   Code Status: Full Code  Family Communication: No family member present at bedside- attempt will be made to update daily  Admission status:   Status is: Inpatient Remains inpatient appropriate because: Needing IV amiodarone drip for better heart control, hypotensive Needing fluid, heart rate management, blood pressure management     Procedures:  NG tube will be placed.   Antimicrobials:  Anti-infectives (From admission, onward)    None         Medication:   amiodarone  150 mg Intravenous Once   apixaban  5 mg Oral BID   furosemide  80 mg Intravenous BID   metoprolol succinate  50 mg Oral BID   pantoprazole  40 mg Oral BID   pregabalin  100 mg Oral Daily   tamsulosin  0.4 mg Oral Daily    acetaminophen, ALPRAZolam, melatonin, ondansetron (ZOFRAN) IV, oxyCODONE, promethazine (PHENERGAN) injection (IM or IVPB)   Objective:   Vitals:   12/29/22 2205 12/30/22 0040 12/30/22 0345 12/30/22 0905  BP: (!) 112/93 113/85 (!) 124/95 (!) 111/93  Pulse:  (!) 54 (!) 53 (!) 53  Resp: _0 Temp:  97.8 F (36.6 C) 98.5 F (36.9 C) 97.9 F (36.6 C)  TempSrc:  Oral Oral Axillary  SpO2:  92% 94% 94%  Weight:   (!) 176.8 kg     Intake/Output Summary (Last 24 hours) at 12/30/2022 1122 Last data filed at 12/30/2022 0740 Gross per 24 hour  Intake 907.55 ml  Output 1000 ml  Net -92.45 ml    Filed Weights   12/30/22 0345  Weight: (!) 176.8 kg     Examination:   Physical Exam  Constitution: Morbidly obese male, alert, cooperative, no distress,  Appears calm and comfortable   HEENT:        No pallor or jaundice. Pulmonary/chest:         Decreased air entry.  Transmitted sounds from upper airways.   Cardio vascular: S1-S2, irregularly irregular.   Abdomen: Gaseously distended.  No pain.  Organs are difficult to assess.  Neuro: Awake and alert.  Patient moves all extremities. Extremities: No leg edema. ------------------------------------------------------------------------------------------------------------------------------------------    LABs:     Latest Ref Rng & Units 12/30/2022    5:46 AM 12/29/2022    5:52 AM 12/28/2022    1:35 PM  CBC  WBC 4.0 - 10.5 K/uL 3.7  5.5  7.8   Hemoglobin 13.0 - 17.0 g/dL 18.4  16.9  17.3   Hematocrit 39.0 - 52.0 % 59.1  54.6  55.3   Platelets 150 - 400 K/uL 240  218  252       Latest Ref Rng & Units 12/30/2022    1:45 AM 12/29/2022    5:52 AM 12/28/2022    1:35 PM  CMP  Glucose  70 - 99 mg/dL 121  137  112   BUN 8 - 23 mg/dL 57  54  47   Creatinine 0.61 - 1.24 mg/dL 2.39  2.36  2.40   Sodium 135 - 145 mmol/L 133  135  133   Potassium 3.5 - 5.1 mmol/L 5.2  3.6  3.5   Chloride 98 - 111 mmol/L 85  90  90   CO2 22 - 32 mmol/L 28  28  29  Calcium 8.9 - 10.3 mg/dL 8.5  8.3  8.9   Total Protein 6.5 - 8.1 g/dL   7.4   Total Bilirubin 0.3 - 1.2 mg/dL   1.3   Alkaline Phos 38 - 126 U/L   73   AST 15 - 41 U/L   19   ALT 0 - 44 U/L   14        Micro Results No results found for this or any previous visit (from the past 240 hour(s)).  Radiology Reports DG Abd 1 View  Result Date: 12/30/2022 CLINICAL DATA:  Abdominal pain. EXAM: ABDOMEN - 1 VIEW COMPARISON:  Scout image for CT abdomen/pelvis 06/01/2021 FINDINGS: Extremely marked gaseous dilatation of the stomach noted. This is associated with diffuse small bowel dilatation with dilated gas-filled small bowel loops measuring up to at least 6.3 cm diameter. Air in stool are seen in the colon. Bones are demineralized. Status post right hip replacement. IMPRESSION: 1. Extremely marked gaseous dilatation of the stomach. 2. Diffuse marked gaseous small bowel dilatation with dilatedloops measuring up to 6.3 cm diameter. Imaging features highly concerning for small bowel obstruction. Abdomen/pelvis CT with contrast would likely prove helpful to further evaluate. Electronically Signed   By: Misty Stanley M.D.   On: 12/30/2022 11:05    SIGNED: Bonnell Public, MD [facility],  Pager (please use [email or web address] to page/text) Please use Epic Secure Chat for non-urgent communication (7AM-7PM)  If 7PM-7AM, please contact night-coverage [email or web address], 12/30/2022, 11:22 AM

## 2022-12-30 NOTE — Progress Notes (Signed)
OT Cancellation Note  Patient Details Name: Timothy Macias MRN: [id number] DOB: 29-Jun-1961   Cancelled Treatment:    Reason Eval/Treat Not Completed: Medical issues which prohibited therapy (Pt with elevated HR, in 156)  [name] 12/30/2022, 9:48 AM

## 2022-12-30 NOTE — Consult Note (Addendum)
Consulting Physician: Nickola Major Dearra Myhand  Referring Provider: Dr. Marthenia Rolling [facility]  Chief Complaint: A. Fib with RVR  Reason for Consult: Bowel obstruction   Subjective   HPI: Timothy Macias is an 62 y.o. male who admitted to the medicine service, being treated by the cardiology service for atrial fibrillation with rapid ventricular response.  He arrived here at [facility] last night, he had spent about 24 hours in the [facility] emergency department.  His symptoms have been going on for about 3 days.  He has noticed tachycardia and palpitations as his main complaint.  He is also noticed abdominal distention over the last few days as well.  He feels his heart rate issues started before the abdominal distention issues.  He is pretty drowsy and says he has not slept in about 3 days.  I spoke to his brother as well as his wife at the bedside.  They explained that he has been really bloated and not able to drink anything since about Thursday.  Everything he drinks he spits right back up with some brown gross fluid.  He had a "ruptured umbilical hernia "that was operated on emergently at [facility] last year.  No other abdominal surgeries.  He says his last bowel movement was a few days ago.  Nasogastric tube was placed about an hour ago and 3 L of decompressed via the nasogastric tube already.  From care everywhere: 06/01/21 Surgeon(s) and Role: * Naida Sleight, MD - Primary * Jennelle Human, MD - Resident - Assisting 1. Exploratory laparotomy 2. Small bowel resection with primary anastomosis 3. Resection of necrotic omentum 4. Primary repair of ventral hernia 5. Placement of provena wound vac   Past Medical History:  Diagnosis Date   Atrial fibrillation (HCC)    CHF (congestive heart failure) (HCC)    Fatty liver    Hypertension    Sleep apnea    wears CPAP    Past Surgical History:  Procedure Laterality Date   HIP ARTHROPLASTY Right    KNEE ARTHROPLASTY Right     TONSILLECTOMY     WISDOM TOOTH EXTRACTION      Family History  Problem Relation Age of Onset   Bone cancer Mother    Other Father        MVA    Social:  reports that he has quit smoking. His smoking use included cigarettes. He smoked an average of 1 pack per day. He has never used smokeless tobacco. He reports that he does not currently use alcohol. He reports that he does not use drugs.  Allergies:  Allergies  Allergen Reactions   Albuterol Swelling   Amiodarone Rash   Penicillins Hives    Medications: Current Outpatient Medications  Medication Instructions   acetaminophen (TYLENOL) 1,000 mg, Oral, Every 8 hours PRN   amiodarone (PACERONE) 400 mg, Oral, Daily   apixaban (ELIQUIS) 5 mg, Oral, Daily   cyclobenzaprine (FLEXERIL) 10 mg, Oral, 3 times daily PRN   diphenhydramine-acetaminophen (TYLENOL PM) 25-500 MG TABS tablet 2 tablets, Oral, At bedtime PRN   DULoxetine (CYMBALTA) 60 mg, Oral, 2 times daily   ferrous sulfate 325 mg, Oral, Daily with breakfast   folic acid (FOLVITE) 1 mg, Oral, Daily   furosemide (LASIX) 80 mg, Oral, Daily   LORazepam (ATIVAN) 0.5 mg, Oral, Every 8 hours PRN   losartan (COZAAR) 12.5 mg, Oral, Daily   Magnesium 400 mg, Oral, Daily   melatonin 5-10 mg, Oral, At bedtime PRN  metoprolol succinate (TOPROL-XL) 75 mg, Oral, Daily   Multiple Vitamins-Minerals (YOUR LIFE MULTI ADULT GUMMIES) CHEW Oral   oxycodone (ROXICODONE) 60 mg, Oral, 3 times daily PRN   pantoprazole (PROTONIX) 40 MG tablet 1 tablet, Oral, 2 times daily   potassium chloride SA (K-DUR) 20 MEQ tablet 40 mEq, Oral, Daily   pregabalin (LYRICA) 100 mg, Oral, Daily at bedtime   promethazine (PHENERGAN) 25 mg, Oral, Every 6 hours PRN   SUMAtriptan (IMITREX) 100 mg, Oral, Every 2 hours PRN   tamsulosin (FLOMAX) 0.8 mg, Oral, Daily   testosterone cypionate (DEPOTESTOSTERONE CYPIONATE) 200 mg, Intramuscular, Weekly    ROS - all of the below systems have been reviewed with the patient  and positives are indicated with bold text General: chills, fever or night sweats Eyes: blurry vision or double vision ENT: epistaxis or sore throat Allergy/Immunology: itchy/watery eyes or nasal congestion Hematologic/Lymphatic: bleeding problems, blood clots or swollen lymph nodes Endocrine: temperature intolerance or unexpected weight changes Breast: new or changing breast lumps or nipple discharge Resp: cough, shortness of breath, or wheezing CV: chest pain or dyspnea on exertion GI: as per HPI GU: dysuria, trouble voiding, or hematuria MSK: joint pain or joint stiffness Neuro: TIA or stroke symptoms Derm: pruritus and skin lesion changes Psych: anxiety and depression  Objective   PE Blood pressure (!) 111/93, pulse (!) 53, temperature 97.9 F (36.6 C), temperature source Axillary, resp. rate 15, weight (!) 176.8 kg, SpO2 94 %. Constitutional: NAD, drowsy; conversant; no deformities Eyes: Moist conjunctiva; no lid lag; anicteric; PERRL Neck: Trachea midline; no thyromegaly Lungs: Normal respiratory effort; no tactile fremitus CV: RRR; no palpable thrills; no pitting edema GI: Abd obese gentleman with a rotund abdomen and old well-healed midline laparotomy incision at the level of the umbilicus but now hanging down with the superior portion of his pannus.;  Abdomen is very distended, however nursing staff says the distention has improved significantly since nasogastric tube decompression.  Firm tissue under midline incision - difficult to palpate fascia or for any recurrent hernias due to patient's body habitus. MSK: Normal range of motion of extremities; no clubbing/cyanosis Psychiatric: Appropriate affect; alert and oriented x3 Lymphatic: No palpable cervical or axillary lymphadenopathy  Results for orders placed or performed during the hospital encounter of 12/28/22 (from the past 24 hour(s))  Troponin I (High Sensitivity)     Status: None   Collection Time: 12/29/22  3:56 PM   Result Value Ref Range   Troponin I (High Sensitivity) 7 <18 ng/L  Troponin I (High Sensitivity)     Status: None   Collection Time: 12/29/22  5:13 PM  Result Value Ref Range   Troponin I (High Sensitivity) 7 <18 ng/L  Basic metabolic panel     Status: Abnormal   Collection Time: 12/30/22  1:45 AM  Result Value Ref Range   Sodium 133 (L) 135 - 145 mmol/L   Potassium 5.2 (H) 3.5 - 5.1 mmol/L   Chloride 85 (L) 98 - 111 mmol/L   CO2 28 22 - 32 mmol/L   Glucose, Bld 121 (H) 70 - 99 mg/dL   BUN 57 (H) 8 - 23 mg/dL   Creatinine, Ser 9.52 (H) 0.61 - 1.24 mg/dL   Calcium 8.5 (L) 8.9 - 10.3 mg/dL   GFR, Estimated 30 (L) >60 mL/min   Anion gap 20 (H) 5 - 15  CBC     Status: Abnormal   Collection Time: 12/30/22  5:46 AM  Result Value Ref Range  WBC 3.7 (L) 4.0 - 10.5 K/uL   RBC 7.07 (H) 4.22 - 5.81 MIL/uL   Hemoglobin 18.4 (H) 13.0 - 17.0 g/dL   HCT 59.1 (H) 39.0 - 52.0 %   MCV 83.6 80.0 - 100.0 fL   MCH 26.0 26.0 - 34.0 pg   MCHC 31.1 30.0 - 36.0 g/dL   RDW 21.1 (H) 11.5 - 15.5 %   Platelets 240 150 - 400 K/uL   nRBC 0.5 (H) 0.0 - 0.2 %     Imaging Orders         DG Chest Port 1 View         DG Abd 1 View         CT ABDOMEN PELVIS WO CONTRAST         DG Abd Portable 1V         US RENAL      Assessment and Plan   Timothy Macias is an 62 y.o. male admitted to the medicine service and being cared for by cardiology for atrial fibrillation with rapid ventricular response.  He was noted to have severe abdominal distention and on plain film there is concern for bowel obstruction.  Nasogastric tube was placed and is already decompressed 3 L through the nasogastric tube in the last hour.  He has a history of acute umbilical hernia emergency with a laparotomy for bowel obstruction in the past.  It appears he may have adhesive bowel obstruction now.  Surgery team will follow-up on the CT scan that was ordered and decide whether he needs continued conservative management or to go to the  operating room.  He did take Eliquis at 8 AM this morning which would complicate any operative plans.  I would recommend stopping Eliquis at this point and using a heparin drip or other form of more quickly reversible anticoagulation while inpatient, as he may require surgery for this bowel obstruction.  The surgery team will continue to follow closely.     ICD-10-CM   1. Paroxysmal atrial fibrillation (HCC)  I48.0     2. Atrial fibrillation with rapid ventricular response (HCC)  I48.91     3. Acute on chronic congestive heart failure, unspecified heart failure type ([place])  I50.9        Felicie Morn, MD  [facility] Use [email or web address] to contact on call provider  New Patient Billing: [phone] - High MDM

## 2022-12-30 NOTE — Progress Notes (Signed)
Pt having severe abdominal pain, nausea and vomiting. Zofran and pain medication administered. Contacted attending and STAT KUB ordered. Heart rate reaching as high as 160's. Amiodarone still running.

## 2022-12-30 NOTE — Progress Notes (Incomplete)
  Echocardiogram 2D Echocardiogram has been performed.  Timothy Macias 12/30/2022, 8:28 AM

## 2022-12-30 NOTE — Consult Note (Signed)
NAME:  Timothy Macias, MRN:  [id number], DOB:  [birth year], LOS: 2 ADMISSION DATE:  12/28/2022, CONSULTATION DATE:  1/7 REFERRING MD:  Julien Girt, CHIEF COMPLAINT:  encephalopathy   History of Present Illness:  Timothy Macias is a 62 y/o gentleman with a history of Afib, HTN, previous SBO due to strangulated peri-umbilical hernia s/p ex-lap, OSA on CPAP who presented with 3 days of nausea, abdominal pain, and distention on 1/6. He began having nausea and vomiting in the ED. His vomiting was described as brown.  He has been feeling poorly overall since a few days before Christmas.  He had worse edema for the past few weeks and was treated by his PCP with escalating doses of diuretics, which she did not feel were beneficial.  He has not recently had constipation, he was still having bowel movements prior to his abdominal symptoms starting.  Today he has had uncontrolled A-fib with RVR and is now on amiodarone infusion.  The rapid response nurse has had to check on him multiple times throughout the day.  He had a KUB demonstrating a bowel obstruction.  NG tube was placed with rapid removal of about 3 L of gastric fluid.  He has become progressively more somnolent throughout the day.  He has OSA and is compliant with CPAP at home.  Last dose of Eliquis was this morning.  Pertinent  Medical History  Hernia surgery- ex lap with bowel resection and primary anastamosis OSA NASH Afib HTN HFpEF, mild PH Peripheral neuropathy on Lyrica; unsure etiology of neuropathy GERD Remote tobacco abuse-quit 10 years ago, multiple packs per day  Significant Hospital Events: Including procedures, antibiotic start and stop dates in addition to other pertinent events   1/6 admitted 1/7 amiodarone infusion, IVF, NGT placed to decompress abdomen. Transferring to ICU.  Interim History / Subjective:    Objective   Blood pressure 120/88, pulse 66, temperature 97.7 F (36.5 C), temperature source Axillary, resp. rate  19, weight (!) 176.8 kg, SpO2 90 %.        Intake/Output Summary (Last 24 hours) at 12/30/2022 1538 Last data filed at 12/30/2022 1522 Gross per 24 hour  Intake 1158.83 ml  Output 4875 ml  Net -3716.17 ml   Filed Weights   12/30/22 0345  Weight: (!) 176.8 kg    Examination: General: Ill-appearing man lying in bed no acute distress HENT: [place]/AT, eyes anicteric, NG tube with brown liquid.  Oral mucosa dry Lungs: Mild expiratory wheezing, no accessory muscle use Cardiovascular: S1-S2, tachycardic, regular rhythm Abdomen: Obese, soft, nontender, hypoactive bowel sounds Extremities: Mild edema, no cyanosis Neuro: Falls asleep when not stimulated, but awake and giving an appropriate history.  Normal speech.  Moving all extremities Derm: Warm, dry, no diffuse rashes  CXR personally reviewed-pneumatically, pulmonary edema, gastric distention  7.3 9/67/85/41  BUN 61 Creatinine 2.35 Lactate 1   Wife is surrogate Management consultant. He wants to be full code.    Resolved Hospital Problem list     Assessment & Plan:  Small bowel obstruction, with normal lactate unlikely strangulated bowel. - NG tube decompression - Once NG output slows can give oral contrast and clamp for to CT - Appreciate surgery's management - Agree with holding Eliquis and switching to heparin infusion tonight -N.p.o. -Correct electrolyte abnormalities  A-fib with RVR; RVR due to inability to keep rate control meds down - Hold PTA oral amiodarone and Toprol-XL -Amiodarone infusion - Heparin drip, hold PTA apixaban - Telemonitoring - Monitor electrolytes and replete as needed  Hyponatremia -Monitor - Avoid hypotonic fluids  Hypokalemia - Repleted - Continue to monitor  AKI (baseline Cr 1.2 in 03/2022); suspect prerenal Hyperphosphatemia - Volume resuscitation - Renally dose meds, avoid nephrotoxins - Strict I's/O - Monitor  Hyperglycemia; prediabetes- A1c 6.0 - Sliding scale insulin as needed -  Goal blood glucose 140-180  Leukopenia, acute-potentially reactive - Monitor  Chronic HFpEF -Hold additional Lasix - Gentle volume resuscitation due to high GI losses  OSA COPD likely with previous tobacco history History of tobacco abuse -brovana and yupelri -BiPAP, will have to use full facemask -Can be referred as an outpatient for lung cancer screening if he is interested  Acute encephalopathy-- likely multifactorial from sleep deprivation, chronic hypercapnia -Transferred to ICU for BiPAP - Avoid opiates is much as possible - Sleep hygiene  Best Practice (right click and "Reselect all SmartList Selections" daily)   Diet/type: NPO DVT prophylaxis: systemic heparin GI prophylaxis: N/A Lines: N/A Foley:  N/A Code Status:  full code Last date of multidisciplinary goals of care discussion [wife and patient updated 1/7]  Labs   CBC: Recent Labs  Lab 12/28/22 1335 12/29/22 0552 12/30/22 0546  WBC 7.8 5.5 3.7*  NEUTROABS 6.4  --   --   HGB 17.3* 16.9 18.4*  HCT 55.3* 54.6* 59.1*  MCV 83.9 84.4 83.6  PLT 252 218 629    Basic Metabolic Panel: Recent Labs  Lab 12/28/22 1334 12/28/22 1335 12/29/22 0552 12/30/22 0145 12/30/22 1416  NA  --  133* 135 133* 134*  K  --  3.5 3.6 5.2* 3.4*  CL  --  90* 90* 85* 85*  CO2  --  29 28 28  33*  GLUCOSE  --  112* 137* 121* 113*  BUN  --  47* 54* 57* 61*  CREATININE  --  2.40* 2.36* 2.39* 2.35*  CALCIUM  --  8.9 8.3* 8.5* 8.4*  MG 1.8  --   --   --   --    GFR: CrCl cannot be calculated (Unknown ideal weight.). Recent Labs  Lab 12/28/22 1335 12/29/22 0552 12/30/22 0546 12/30/22 1224 12/30/22 1436  WBC 7.8 5.5 3.7*  --   --   LATICACIDVEN  --   --   --  1.6 1.0    Liver Function Tests: Recent Labs  Lab 12/28/22 1335  AST 19  ALT 14  ALKPHOS 73  BILITOT 1.3*  PROT 7.4  ALBUMIN 3.7   No results for input(s): "LIPASE", "AMYLASE" in the last 168 hours. No results for input(s): "AMMONIA" in the last 168  hours.  ABG    Component Value Date/Time   PHART 7.39 12/30/2022 1430   PCO2ART 67 (HH) 12/30/2022 1430   PO2ART 85 12/30/2022 1430   HCO3 40.6 (H) 12/30/2022 1430   O2SAT 97.8 12/30/2022 1430     Coagulation Profile: No results for input(s): "INR", "PROTIME" in the last 168 hours.  Cardiac Enzymes: No results for input(s): "CKTOTAL", "CKMB", "CKMBINDEX", "TROPONINI" in the last 168 hours.  HbA1C: Hgb A1c MFr Bld  Date/Time Value Ref Range Status  07/29/2019 08:21 AM 6.0 4.6 - 6.5 % Final    Comment:    Glycemic Control Guidelines for People with Diabetes:Non Diabetic:  <6%Goal of Therapy: <7%Additional Action Suggested:  >8%     CBG: Recent Labs  Lab 12/30/22 1410  GLUCAP 140*    Review of Systems:   Review of Systems  Constitutional:  Positive for malaise/fatigue.  HENT: Negative.    Respiratory: Negative.  Cardiovascular:  Positive for palpitations and leg swelling.  Gastrointestinal:  Positive for abdominal pain, nausea and vomiting. Negative for constipation.  Musculoskeletal: Negative.      Past Medical History:  He,  has a past medical history of Atrial fibrillation (HCC), CHF (congestive heart failure) (HCC), Fatty liver, Hypertension, and Sleep apnea.   Surgical History:   Past Surgical History:  Procedure Laterality Date   HIP ARTHROPLASTY Right    KNEE ARTHROPLASTY Right    TONSILLECTOMY     WISDOM TOOTH EXTRACTION       Social History:   reports that he has quit smoking. His smoking use included cigarettes. He smoked an average of 1 pack per day. He has never used smokeless tobacco. He reports that he does not currently use alcohol. He reports that he does not use drugs.   Family History:  His family history includes Bone cancer in his mother; Other in his father.   Allergies Allergies  Allergen Reactions   Albuterol Swelling   Amiodarone Rash   Penicillins Hives     Home Medications  Prior to Admission medications   Medication  Sig Start Date End Date Taking? Authorizing Provider  acetaminophen (TYLENOL) 500 MG tablet Take 1,000 mg by mouth every 8 (eight) hours as needed for mild pain.   Yes [provider]  amiodarone (PACERONE) 200 MG tablet Take 400 mg by mouth daily. 12/10/22  Yes [provider]  apixaban (ELIQUIS) 5 MG TABS tablet Take 5 mg by mouth daily. 05/20/18  Yes [provider]  cyclobenzaprine (FLEXERIL) 10 MG tablet Take 10 mg by mouth 3 (three) times daily as needed for muscle spasms. 03/27/19  Yes [provider]  diphenhydramine-acetaminophen (TYLENOL PM) 25-500 MG TABS tablet Take 2 tablets by mouth at bedtime as needed.   Yes [provider]  DULoxetine (CYMBALTA) 60 MG capsule Take 60 mg by mouth 2 (two) times daily. 05/04/19  Yes [provider]  ferrous sulfate 325 (65 FE) MG EC tablet Take 325 mg by mouth daily with breakfast.   Yes [provider]  folic acid (FOLVITE) 1 MG tablet Take 1 mg by mouth daily. 11/13/22  Yes [provider]  furosemide (LASIX) 80 MG tablet Take 80 mg by mouth daily. 02/02/19  Yes [provider]  LORazepam (ATIVAN) 0.5 MG tablet Take 0.5 mg by mouth every 8 (eight) hours as needed for anxiety. 03/18/19  Yes [provider]  losartan (COZAAR) 25 MG tablet Take 12.5 mg by mouth daily.   Yes [provider]  Magnesium 400 MG TABS Take 400 mg by mouth daily.   Yes [provider]  melatonin 5 MG TABS Take 5-10 mg by mouth at bedtime as needed (sleep).   Yes [provider]  metoprolol succinate (TOPROL-XL) 25 MG 24 hr tablet Take 75 mg by mouth daily. 12/10/22  Yes [provider]  Multiple Vitamins-Minerals (YOUR LIFE MULTI ADULT GUMMIES) CHEW Chew by mouth.   Yes [provider]  oxycodone (ROXICODONE) 30 MG immediate release tablet Take 60 mg by mouth 3 (three) times daily as needed (pain). 06/22/19  Yes [provider]  pantoprazole  (PROTONIX) 40 MG tablet Take 1 tablet by mouth 2 (two) times daily. 06/03/19  Yes [provider]  potassium chloride SA (K-DUR) 20 MEQ tablet Take 40 mEq by mouth daily. 04/02/19  Yes [provider]  pregabalin (LYRICA) 100 MG capsule Take 100 mg by mouth at bedtime. 04/15/22 11/20/32 Yes  [provider]  promethazine (PHENERGAN) 25 MG tablet Take 25 mg by mouth every 6 (six) hours as needed for nausea or vomiting. 06/03/19  Yes [provider]  SUMAtriptan (IMITREX) 100 MG tablet Take 100 mg by mouth every 2 (two) hours as needed for migraine. 02/10/16  Yes [provider]  tamsulosin (FLOMAX) 0.4 MG CAPS capsule Take 0.8 mg by mouth daily. 06/03/19  Yes [provider]  testosterone cypionate (DEPOTESTOSTERONE CYPIONATE) 200 MG/ML injection Inject 200 mg into the muscle once a week. 06/03/19  Yes [provider]     Critical care time: 45 min.     Steffanie Dunn, DO 12/30/22 3:40 PM [facility]  For contact information, see Amion. If no response to pager, please call PCCM consult pager. After hours, 7PM- 7AM, please call Elink.

## 2022-12-30 NOTE — Progress Notes (Signed)
Rounding Note    Patient Name: Timothy Macias Date of Encounter: 12/30/2022  [place] Cardiologist: None   Subjective   NAEO. Admitted yesterday with permanent AF with RVR.  Inpatient Medications    Scheduled Meds:  amiodarone  150 mg Intravenous Once   apixaban  5 mg Oral BID   furosemide  80 mg Intravenous BID   metoprolol succinate  25 mg Oral BID   pantoprazole  40 mg Oral BID   pregabalin  100 mg Oral Daily   tamsulosin  0.4 mg Oral Daily   Continuous Infusions:  amiodarone 30 mg/hr (12/30/22 0900)   promethazine (PHENERGAN) injection (IM or IVPB)     PRN Meds: acetaminophen, ALPRAZolam, melatonin, ondansetron (ZOFRAN) IV, oxyCODONE, promethazine (PHENERGAN) injection (IM or IVPB)   Vital Signs    Vitals:   12/29/22 2205 12/30/22 0040 12/30/22 0345 12/30/22 0905  BP: (!) 112/93 113/85 (!) 124/95 (!) 111/93  Pulse:  (!) 54 (!) 53 (!) 53  Resp: 18 20 18 15   Temp:  97.8 F (36.6 C) 98.5 F (36.9 C) 97.9 F (36.6 C)  TempSrc:  Oral Oral Axillary  SpO2:  92% 94% 94%  Weight:   (!) 176.8 kg     Intake/Output Summary (Last 24 hours) at 12/30/2022 1002 Last data filed at 12/30/2022 0740 Gross per 24 hour  Intake 907.55 ml  Output 1000 ml  Net -92.45 ml      12/30/2022    3:45 AM 02/15/2022   11:09 AM 11/08/2021   12:40 PM  Last 3 Weights  Weight (lbs) 389 lb 12.8 oz 306 lb 293 lb  Weight (kg) 176.812 kg 138.801 kg 132.904 kg      Telemetry    AF w/ RVR, rates 130-160s - Personally Reviewed  ECG    AF w/ RVR. RBBB. LAFB - Personally Reviewed  Physical Exam   GEN: No acute distress.  Morbidly obese. Neck: No JVD Cardiac: irregularly irregular, no murmurs, rubs, or gallops.  Respiratory: Clear to auscultation bilaterally. GI: Soft, nontender, non-distended  MS: No edema; No deformity. Neuro:  Nonfocal  Psych: Normal affect   Labs    High Sensitivity Troponin:   Recent Labs  Lab 12/28/22 1400 12/28/22 1620 12/29/22 1556  12/29/22 1713  TROPONINIHS 9 8 7 7      Chemistry Recent Labs  Lab 12/28/22 1334 12/28/22 1335 12/29/22 0552 12/30/22 0145  NA  --  133* 135 133*  K  --  3.5 3.6 5.2*  CL  --  90* 90* 85*  CO2  --  29 28 28   GLUCOSE  --  112* 137* 121*  BUN  --  47* 54* 57*  CREATININE  --  2.40* 2.36* 2.39*  CALCIUM  --  8.9 8.3* 8.5*  MG 1.8  --   --   --   PROT  --  7.4  --   --   ALBUMIN  --  3.7  --   --   AST  --  19  --   --   ALT  --  14  --   --   ALKPHOS  --  73  --   --   BILITOT  --  1.3*  --   --   GFRNONAA  --  30* 31* 30*  ANIONGAP  --  14 17* 20*    Lipids No results for input(s): "CHOL", "TRIG", "HDL", "LABVLDL", "LDLCALC", "CHOLHDL" in the last 168 hours.  Hematology Recent Labs  Lab 12/28/22 1335 12/29/22 0552 12/30/22 0546  WBC 7.8 5.5 3.7*  RBC 6.59* 6.47* 7.07*  HGB 17.3* 16.9 18.4*  HCT 55.3* 54.6* 59.1*  MCV 83.9 84.4 83.6  MCH 26.3 26.1 26.0  MCHC 31.3 31.0 31.1  RDW 20.7* 21.1* 21.1*  PLT 252 218 240   Thyroid No results for input(s): "TSH", "FREET4" in the last 168 hours.  BNP Recent Labs  Lab 12/28/22 1335 12/29/22 0552  BNP 179.0* 178.0*    DDimer No results for input(s): "DDIMER" in the last 168 hours.   Radiology    DG Chest Port 1 View  Result Date: 12/28/2022 CLINICAL DATA:  Substantial weight loss over several days. Atrial fibrillation. Upper abdominal pain. EXAM: PORTABLE CHEST 1 VIEW COMPARISON:  04/10/2022 FINDINGS: Mild enlargement of the cardiopericardial silhouette, without findings of acute pulmonary edema. Poor definition left hemidiaphragm which is probably related to apical lordotic projection, cannot exclude left lower lobe airspace opacity. Correlate with chest auscultation. The lungs appear otherwise clear.  Mild lower thoracic spondylosis. IMPRESSION: 1. Mild enlargement of the cardiopericardial silhouette, without edema. 2. Poor definition of the left hemidiaphragm which is probably related to apical lordotic projection, cannot  exclude left lower lobe airspace opacity. Correlate with chest auscultation. Electronically Signed   By: Gaylyn Rong M.D.   On: 12/28/2022 13:56      Assessment & Plan    #Chronic AF On amiodarone gtt, digoxin, metoprolol Continue apixaban.  Rates are still persistently high. May try 1 more cardioversion early this week with 360J defib to assess response after load of amiodarone.   #Chronic diastolic heart failure NYHA III-IV. Symptoms related to HF and weight. Continue IV diuresis BID. Daily weights. Keep K>4, Mg>2  #Morbid obesity  #Abdominal pain/nausea Dilated loops of bowel present on 12/28/2022 cxr.  KUB Pending Management per primary team, message sent to Primary MD.    For questions or updates, please contact [facility] Please consult [email or web address] for contact info under        Signed, Lanier Prude, MD  12/30/2022, 10:02 AM

## 2022-12-30 NOTE — Progress Notes (Signed)
Patient seen and examined with RR nurse and wife at bedside. Transferring to ICU for bipap and closer monitoring. If mental status improves and he can tolerate bipap, can transfer out of ICU tomorrow. Keep NGT to LIWS (still needs CT with contrast) and con't amiodarone. NPO. Full consult note to follow.  Julian Hy, DO 12/30/22 4:06 PM [facility]  For contact information, see Amion. If no response to pager, please call PCCM consult pager. After hours, 7PM- 7AM, please call Elink.

## 2022-12-30 NOTE — Progress Notes (Signed)
PT Cancellation Note  Patient Details Name: Timothy Macias MRN: [id number] DOB: 1961-03-18   Cancelled Treatment:    Reason Eval/Treat Not Completed: Medical issues which prohibited therapy, resting HR 156. Will follow up for PT Evaluation as appropriate.  Mabeline Caras, PT, DPT Acute Rehabilitation Services  Personal: [facility] Office: [place] 12/30/2022, 8:28 AM

## 2022-12-30 NOTE — Significant Event (Addendum)
Rapid Response Event Note   Reason for Call :  Decreased LOC  Initial Focused Assessment:  0900:   RRT has been assisting with patient intermittently since 0900. Pt with signifiant abdominal distention this morning. Abdomen taught, round, firm. Bowel sounds inactive. MD notified and request granted for KUB.  VS: T 97.70F, BP 111/93, HR 160, RR 15, SpO2 94%on 4LNC  1145:  Following KUB results, NGT placed. 2 liters gastric content collected within the hour following NGT placement. Pt abdomen round, soft following removal of 2L gastric content. NGT continues to drain.  VS: BP 114/81, HR 148, RR 21, SpO2 96% on 4LNC  1300: Pt resting in bed. Arouses to physical stimuli, pt states he is a deep sleeper. Additional 1L gastric output.  VS: BP 121/80, HR 140, RR 30, SpO2 93% 4LNC  1415: Pt remains somnolent. Hard to arouse. Disoriented to place, time and situation.  ABG & BMP ordered. MD notified and additional labs ordered. Level of care dicussed with attending MD. PCCM consult recommended.  VS: BP 106/80, HR 147, RR 25, SpO2 92% on 4LNC CBG: 140  1545: Family at bedside. Pt drowsy, however conversant. Remains disoriented to time. PCCM evaluating patient.   Interventions:  -KUB -NGT with follow-up KUB -IV team consult for second PIV -CBG: 140 -IVF bolus and maintenance fluids -ABG: 7.39/ 67/ 85/ 40.6 -BMP: K 5.2 > 3.4 -NGT advanced following KUB report -CT A/P pending  Plan of Care:  Transfer to ICU  Event Summary:  MD Notified: Dr. Marthenia Rolling Call Time 1400 Arrival Time: 4166 End Time: [place], RN

## 2022-12-30 NOTE — Progress Notes (Signed)
Pt transported to CT and back on BIPAP with no complication.

## 2022-12-31 ENCOUNTER — Inpatient Hospital Stay (HOSPITAL_COMMUNITY): Payer: BC Managed Care – PPO

## 2022-12-31 DIAGNOSIS — I4891 Unspecified atrial fibrillation: Secondary | ICD-10-CM | POA: Diagnosis not present

## 2022-12-31 LAB — RENAL FUNCTION PANEL
Albumin: 3.1 g/dL — ABNORMAL LOW (ref 3.5–5.0)
Anion gap: 22 — ABNORMAL HIGH (ref 5–15)
BUN: 63 mg/dL — ABNORMAL HIGH (ref 8–23)
CO2: 22 mmol/L (ref 22–32)
Calcium: 8.1 mg/dL — ABNORMAL LOW (ref 8.9–10.3)
Chloride: 90 mmol/L — ABNORMAL LOW (ref 98–111)
Creatinine, Ser: 2.29 mg/dL — ABNORMAL HIGH (ref 0.61–1.24)
GFR, Estimated: 32 mL/min — ABNORMAL LOW (ref >60)
Glucose, Bld: 90 mg/dL (ref 70–99)
Phosphorus: 4.8 mg/dL — ABNORMAL HIGH (ref 2.5–4.6)
Potassium: 4.2 mmol/L (ref 3.5–5.1)
Sodium: 134 mmol/L — ABNORMAL LOW (ref 135–145)

## 2022-12-31 LAB — GLUCOSE, CAPILLARY
Glucose-Capillary: 98 mg/dL (ref 70–99)
Glucose-Capillary: 90 mg/dL (ref 70–99)
Glucose-Capillary: 114 mg/dL — ABNORMAL HIGH (ref 70–99)
Glucose-Capillary: 99 mg/dL (ref 70–99)
Glucose-Capillary: 107 mg/dL — ABNORMAL HIGH (ref 70–99)

## 2022-12-31 LAB — CBC
HCT: 56 % — ABNORMAL HIGH (ref 39.0–52.0)
Hemoglobin: 17.1 g/dL — ABNORMAL HIGH (ref 13.0–17.0)
MCH: 25.8 pg — ABNORMAL LOW (ref 26.0–34.0)
MCHC: 30.5 g/dL (ref 30.0–36.0)
MCV: 84.3 fL (ref 80.0–100.0)
Platelets: 196 10*3/uL (ref 150–400)
RBC: 6.64 MIL/uL — ABNORMAL HIGH (ref 4.22–5.81)
RDW: 20.3 % — ABNORMAL HIGH (ref 11.5–15.5)
WBC: 5.6 10*3/uL (ref 4.0–10.5)
nRBC: 0 % (ref 0.0–0.2)

## 2022-12-31 LAB — BASIC METABOLIC PANEL
Anion gap: 17 — ABNORMAL HIGH (ref 5–15)
BUN: 61 mg/dL — ABNORMAL HIGH (ref 8–23)
CO2: 28 mmol/L (ref 22–32)
Calcium: 8.1 mg/dL — ABNORMAL LOW (ref 8.9–10.3)
Chloride: 88 mmol/L — ABNORMAL LOW (ref 98–111)
Creatinine, Ser: 2.33 mg/dL — ABNORMAL HIGH (ref 0.61–1.24)
GFR, Estimated: 31 mL/min — ABNORMAL LOW (ref >60)
Glucose, Bld: 91 mg/dL (ref 70–99)
Potassium: 4.1 mmol/L (ref 3.5–5.1)
Sodium: 133 mmol/L — ABNORMAL LOW (ref 135–145)

## 2022-12-31 LAB — BRAIN NATRIURETIC PEPTIDE: B Natriuretic Peptide: 72.7 pg/mL (ref 0.0–100.0)

## 2022-12-31 LAB — HEPARIN LEVEL (UNFRACTIONATED): Heparin Unfractionated: >1.1 IU/mL — ABNORMAL HIGH (ref 0.30–0.70)

## 2022-12-31 LAB — APTT
aPTT: 45 seconds — ABNORMAL HIGH (ref 24–36)
aPTT: 72 seconds — ABNORMAL HIGH (ref 24–36)

## 2022-12-31 LAB — MAGNESIUM: Magnesium: 2.2 mg/dL (ref 1.7–2.4)

## 2022-12-31 LAB — PROTIME-INR
INR: 2 — ABNORMAL HIGH (ref 0.8–1.2)
Prothrombin Time: 22.2 seconds — ABNORMAL HIGH (ref 11.4–15.2)

## 2022-12-31 MED ORDER — POTASSIUM CHLORIDE CRYS ER 20 MEQ PO TBCR: 20.0000 meq | EXTENDED_RELEASE_TABLET | Freq: Two times a day (BID) | ORAL | Status: DC

## 2022-12-31 MED ORDER — AMIODARONE IV BOLUS ONLY 150 MG/100ML
150.0000 mg | Freq: Once | INTRAVENOUS | Status: AC
  Administered 2022-12-31: 150 mg via INTRAVENOUS

## 2022-12-31 MED ORDER — SODIUM CHLORIDE 0.9 % IV SOLN: INTRAVENOUS | Status: DC

## 2022-12-31 MED ORDER — DIATRIZOATE MEGLUMINE & SODIUM 66-10 % PO SOLN
90.0000 mL | Freq: Once | ORAL | Status: AC
  Administered 2022-12-31: 90 mL via NASOGASTRIC
  Filled 2022-12-31 (×2): qty 90
  Filled 2022-12-31: qty 30

## 2022-12-31 MED ORDER — FUROSEMIDE 10 MG/ML IJ SOLN
80.0000 mg | Freq: Every day | INTRAMUSCULAR | Status: DC
  Administered 2022-12-31: 80 mg via INTRAVENOUS
  Filled 2022-12-31: qty 8

## 2022-12-31 MED ORDER — LACTATED RINGERS IV BOLUS
500.0000 mL | Freq: Once | INTRAVENOUS | Status: AC
  Administered 2022-12-31: 500 mL via INTRAVENOUS

## 2022-12-31 MED ORDER — POTASSIUM CHLORIDE 20 MEQ PO PACK
20.0000 meq | PACK | Freq: Two times a day (BID) | ORAL | Status: DC
  Administered 2022-12-31: 20 meq

## 2022-12-31 MED ORDER — POTASSIUM CHLORIDE 20 MEQ PO PACK
20.0000 meq | PACK | Freq: Two times a day (BID) | ORAL | Status: DC
  Administered 2022-12-31: 20 meq via ORAL
  Filled 2022-12-31 (×2): qty 1

## 2022-12-31 NOTE — Progress Notes (Signed)
ANTICOAGULATION CONSULT NOTE   Pharmacy Consult for heparin Indication: atrial fibrillation  Allergies  Allergen Reactions   Albuterol Swelling   Amiodarone Rash   Penicillins Hives    Patient Measurements: Height: 5\' 8"  (172.7 cm) Weight: (Abnormal) 176.8 kg (389 lb 12.8 oz) IBW/kg (Calculated) : 68.4 Heparin Dosing Weight: 113kg  Vital Signs: Temp: 98.6 F (37 C) (01/07 2347) Temp Source: Axillary (01/07 2347) BP: 123/74 (01/08 0200) Pulse Rate: 126 (01/08 0237)  Labs: Recent Labs    12/28/22 1620 12/29/22 0552 12/29/22 1556 12/29/22 1713 12/30/22 0145 12/30/22 0546 12/30/22 1416 12/30/22 1512 12/30/22 2105 12/31/22 0258 12/31/22 0259  HGB  --    < >  --   --   --  18.4*  --  17.9* 18.4* 17.1*  --   HCT  --    < >  --   --   --  59.1*  --  55.8* 54.0* 56.0*  --   PLT  --    < >  --   --   --  240  --  212  --  196  --   APTT  --   --   --   --   --   --   --   --   --  45*  --   HEPARINUNFRC  --   --   --   --   --   --   --   --   --   --  >1.10*  CREATININE  --    < >  --   --  2.39*  --  2.35* 2.46*  --   --   --   TROPONINIHS 8  --  7 7  --   --   --   --   --   --   --    < > = values in this interval not displayed.     Estimated Creatinine Clearance: 49.9 mL/min (A) (by C-G formula based on SCr of 2.46 mg/dL (H)).   Medical History: Past Medical History:  Diagnosis Date   Atrial fibrillation (HCC)    CHF (congestive heart failure) (HCC)    Fatty liver    Hypertension    Sleep apnea    wears CPAP    Medications:  Medications Prior to Admission  Medication Sig Dispense Refill Last Dose   acetaminophen (TYLENOL) 500 MG tablet Take 1,000 mg by mouth every 8 (eight) hours as needed for mild pain.   Past Month   amiodarone (PACERONE) 200 MG tablet Take 400 mg by mouth daily.   12/28/2022 at 1400   apixaban (ELIQUIS) 5 MG TABS tablet Take 5 mg by mouth daily.   12/28/2022 at 0800   cyclobenzaprine (FLEXERIL) 10 MG tablet Take 10 mg by mouth 3 (three)  times daily as needed for muscle spasms.   Past Week   diphenhydramine-acetaminophen (TYLENOL PM) 25-500 MG TABS tablet Take 2 tablets by mouth at bedtime as needed.   12/28/2022   DULoxetine (CYMBALTA) 60 MG capsule Take 60 mg by mouth 2 (two) times daily.   12/28/2022   ferrous sulfate 325 (65 FE) MG EC tablet Take 325 mg by mouth daily with breakfast.   12/28/2022   folic acid (FOLVITE) 1 MG tablet Take 1 mg by mouth daily.   12/28/2022   furosemide (LASIX) 80 MG tablet Take 80 mg by mouth daily.   12/28/2022   LORazepam (ATIVAN) 0.5 MG tablet Take 0.5 mg  by mouth every 8 (eight) hours as needed for anxiety.   12/28/2022   losartan (COZAAR) 25 MG tablet Take 12.5 mg by mouth daily.   12/28/2022   Magnesium 400 MG TABS Take 400 mg by mouth daily.   12/28/2022   melatonin 5 MG TABS Take 5-10 mg by mouth at bedtime as needed (sleep).   Past Month   metoprolol succinate (TOPROL-XL) 25 MG 24 hr tablet Take 75 mg by mouth daily.   12/28/2022 at 0800   Multiple Vitamins-Minerals (YOUR LIFE MULTI ADULT GUMMIES) CHEW Chew by mouth.   12/27/2022   oxycodone (ROXICODONE) 30 MG immediate release tablet Take 60 mg by mouth 3 (three) times daily as needed (pain).   12/28/2022   pantoprazole (PROTONIX) 40 MG tablet Take 1 tablet by mouth 2 (two) times daily.   12/28/2022   potassium chloride SA (K-DUR) 20 MEQ tablet Take 40 mEq by mouth daily.   12/28/2022   pregabalin (LYRICA) 100 MG capsule Take 100 mg by mouth at bedtime.   12/27/2022   promethazine (PHENERGAN) 25 MG tablet Take 25 mg by mouth every 6 (six) hours as needed for nausea or vomiting.   12/28/2022   SUMAtriptan (IMITREX) 100 MG tablet Take 100 mg by mouth every 2 (two) hours as needed for migraine.   unk   tamsulosin (FLOMAX) 0.4 MG CAPS capsule Take 0.8 mg by mouth daily.   12/28/2022   testosterone cypionate (DEPOTESTOSTERONE CYPIONATE) 200 MG/ML injection Inject 200 mg into the muscle once a week.   12/16/2022   Scheduled:   amiodarone  150 mg Intravenous Once    arformoterol  15 mcg Nebulization BID   Chlorhexidine Gluconate Cloth  6 each Topical Daily   insulin aspart  1-3 Units Subcutaneous Q4H   naloxone  0.4 mg Intramuscular Once   pantoprazole (PROTONIX) IV  40 mg Intravenous Q24H   pregabalin  100 mg Oral Daily   revefenacin  175 mcg Nebulization Daily   tamsulosin  0.4 mg Oral Daily   Infusions:   sodium chloride 150 mL/hr at 12/31/22 0200   amiodarone 60 mg/hr (12/31/22 0200)   heparin 1,700 Units/hr (12/31/22 0200)    Assessment: Pt with a hx of afib on apixaba who was admitted for possible bowel obstruction. Plan to bridge with heparin for possible surgery. Last apixaban dose 12/30/22 @ 8AM. Expecting heparin level to be elevated due to apixaban. We will use PTT to monitor until correlation.  Heparin level >1.10 and aPTT 45 labs are not correlating; no issues with infusion or overt s/sx of bleeding reported  Goal of Therapy:  Heparin level 0.3-0.7 units/ml aPTT 66-102 seconds Monitor platelets by anticoagulation protocol: Yes   Plan:  Increase heparin gtt (~3.5 units/kg/hr) to 2100 units/hr  HL/APTT in 8 hours Daily PTT/HL/CBC  Georga Bora, PharmD Clinical Pharmacist 12/31/2022 4:07 AM Please check AMION for all [place] numbers

## 2022-12-31 NOTE — Progress Notes (Signed)
Subjective/Chief Complaint: No ab pain, no flatus or bm, ng with 10 l out since placement   Objective: Vital signs in last 24 hours: Temp:  [97.7 F (36.5 C)-98.7 F (37.1 C)] 97.7 F (36.5 C) (01/08 0741) Pulse Rate:  [53-158] 147 (01/08 0741) Resp:  [12-30] 19 (01/08 0741) BP: (106-141)/(72-111) 111/91 (01/08 0741) SpO2:  [90 %-100 %] 96 % (01/08 0741) FiO2 (%):  [60 %-100 %] 60 % (01/08 0741) Weight:  [173.1 kg] 173.1 kg (01/08 0500) Last BM Date : 12/28/22  Intake/Output from previous day: 01/07 0701 - 01/08 0700 In: 4374.7 [I.V.:3410.9; IV Piggyback:963.8] Out: 85631 [Urine:2700; Emesis/NG output:8125] Intake/Output this shift: No intake/output data recorded.  Ab obese, well healed scar, nontender  Lab Results:  Recent Labs    12/30/22 1512 12/30/22 2105 12/31/22 0258  WBC 3.7*  --  5.6  HGB 17.9* 18.4* 17.1*  HCT 55.8* 54.0* 56.0*  PLT 212  --  196   BMET Recent Labs    12/31/22 0258 12/31/22 0259  NA 133* 134*  K 4.1 4.2  CL 88* 90*  CO2 28 22  GLUCOSE 91 90  BUN 61* 63*  CREATININE 2.33* 2.29*  CALCIUM 8.1* 8.1*   PT/INR No results for input(s): "LABPROT", "INR" in the last 72 hours. ABG Recent Labs    12/30/22 1430 12/30/22 2105  PHART 7.39 7.321*  HCO3 40.6* 35.5*    Studies/Results: DG Chest Port 1 View  Result Date: 12/30/2022 CLINICAL DATA:  Shortness of breath EXAM: PORTABLE CHEST 1 VIEW COMPARISON:  Chest x-ray 12/28/2022 FINDINGS: Enteric tube extends below the diaphragm. The heart is enlarged. There is central pulmonary vascular congestion. There are patchy opacities in the left lung base questionable small left pleural effusion. There is no pneumothorax or acute fracture. IMPRESSION: 1. Cardiomegaly with central pulmonary vascular congestion. 2. Patchy opacities in the left lung base could be atelectasis or infiltrate. Electronically Signed   By: Darliss Cheney M.D.   On: 12/30/2022 23:31   CT ABDOMEN PELVIS WO  CONTRAST  Result Date: 12/30/2022 CLINICAL DATA:  Three days of nausea, abdominal pain, and distention. Concern for bowel obstruction. EXAM: CT ABDOMEN AND PELVIS WITHOUT CONTRAST TECHNIQUE: Multidetector CT imaging of the abdomen and pelvis was performed following the standard protocol without IV contrast. RADIATION DOSE REDUCTION: This exam was performed according to the departmental dose-optimization program which includes automated exposure control, adjustment of the mA and/or kV according to patient size and/or use of iterative reconstruction technique. COMPARISON:  CT abdomen pelvis dated 06/01/2021. FINDINGS: Lower chest: There is moderate bilateral lower lobe atelectasis. Heart is enlarged. Hepatobiliary: No focal liver abnormality is seen. No gallstones, gallbladder wall thickening, or biliary dilatation. Pancreas: Unremarkable. No pancreatic ductal dilatation or surrounding inflammatory changes. Spleen: Normal in size without focal abnormality. Adrenals/Urinary Tract: Adrenal glands are unremarkable. Bilateral renal cysts are redemonstrated with the largest measuring 4.9 cm on the right and 9.0 x 6.2 cm on the left. Bilateral nonobstructing calculi are seen, measuring up to 8 x 5 mm on the right and 8 x 5 mm on the left. There is no hydronephrosis on either side. The urinary bladder is unremarkable. Stomach/Bowel: An enteric tube terminates in the stomach. Stomach is within normal limits. Appendix appears normal. Multiple dilated loops of small bowel are seen with a transition 0.2 distal decompressed small bowel in the lower mid abdomen. There is colonic diverticulosis without evidence of diverticulitis. Vascular/Lymphatic: Aortic atherosclerosis. No enlarged abdominal or pelvic lymph nodes. Reproductive: The  prostate is obscured by streak artifact. Other: The patient is status post anterior abdominal wall hernia repair. No abdominal wall hernia is identified on today's exam. No free intraperitoneal  fluid. Musculoskeletal: The patient is status post a total hip arthroplasty on the right which obscures portions of the pelvis. Degenerative changes are seen in the spine. IMPRESSION: 1. High-grade small-bowel obstruction with a transition point in the lower mid abdomen. No abdominal wall hernia. 2. Moderate bilateral lower lobe atelectasis. 3. Cardiomegaly. Aortic Atherosclerosis (ICD10-I70.0). Electronically Signed   By: Romona Curls M.D.   On: 12/30/2022 22:03   CT HEAD WO CONTRAST ( )  Result Date: 12/30/2022 CLINICAL DATA:  Altered mental status. EXAM: CT HEAD WITHOUT CONTRAST TECHNIQUE: Contiguous axial images were obtained from the base of the skull through the vertex without intravenous contrast. RADIATION DOSE REDUCTION: This exam was performed according to the departmental dose-optimization program which includes automated exposure control, adjustment of the mA and/or kV according to patient size and/or use of iterative reconstruction technique. COMPARISON:  CT head dated 06/04/2019 FINDINGS: Brain: No evidence of acute infarction, hemorrhage, hydrocephalus, extra-axial collection or mass lesion/mass effect. Periventricular white matter hypoattenuation likely represents chronic small vessel ischemic disease. Vascular: There are vascular calcifications in the carotid siphons. Skull: Normal. Negative for fracture or focal lesion. Sinuses/Orbits: No acute finding. Other: A nasogastric tube is partially imaged. IMPRESSION: 1. No acute intracranial process. Electronically Signed   By: Romona Curls M.D.   On: 12/30/2022 21:53   DG Abd 1 View  Result Date: 12/30/2022 CLINICAL DATA:  Enteric tube placement. EXAM: ABDOMEN - 1 VIEW COMPARISON:  Abdominal radiograph dated 12/30/2022. FINDINGS: An enteric tube terminates in the stomach. Multiple gas-filled loops of bowel are partially imaged. IMPRESSION: An enteric tube terminates in the stomach. Electronically Signed   By: Romona Curls M.D.   On:  12/30/2022 17:17   DG Abd Portable 1V  Result Date: 12/30/2022 CLINICAL DATA:  NG tube placement EXAM: PORTABLE ABDOMEN - 1 VIEW COMPARISON:  12/30/2022, 10:31 a.m. FINDINGS: Esophagogastric tube is positioned with tip in the stomach, side port at the level of the gastroesophageal junction. Stomach and included small bowel remain gas distended. No obvious free air. IMPRESSION: 1. Esophagogastric tube is positioned with tip in the stomach, side port at the level of the gastroesophageal junction. Consider advancement to ensure subdiaphragmatic positioning. 2. Stomach and included small bowel remain gas distended consistent with ileus or obstruction. Electronically Signed   By: Jearld Lesch M.D.   On: 12/30/2022 15:43   US RENAL  Result Date: 12/30/2022 CLINICAL DATA:  Acute renal insufficiency EXAM: RENAL / URINARY TRACT ULTRASOUND COMPLETE COMPARISON:  Renal ultrasound August 21, 2022 FINDINGS: Right Kidney: Renal measurements: 11.5 x 6.0 x 7.1 cm = volume: 255 mL. Echogenicity within normal limits. No mass or hydronephrosis visualized. Left Kidney: A 7.8 x 6.5 x 6.8 cm cyst is seen in the expected location of the left kidney. The left kidney which was identified on the August 21, 2022 study is not visualized on provided images. Bladder: The bladder is empty limiting evaluation. Other: None. IMPRESSION: 1. A 7.8 x 6.5 x 6.8 cm cyst is seen in the expected location of the left kidney. The left kidney which was identified on the August 21, 2022 study is not visualized on provided images. 2. The right kidney is normal. 3. The bladder is empty limiting evaluation. Electronically Signed   By: Gerome Sam III M.D.   On: 12/30/2022 14:31   ECHOCARDIOGRAM  COMPLETE  Result Date: 12/30/2022    ECHOCARDIOGRAM REPORT   Patient Name:   Timothy Macias Date of Exam: 12/30/2022 Medical Rec #:  [id number]         Height:       67.5 in Accession #:    [id number]        Weight:       389.8 lb Date of Birth:  [birth year]           BSA:          2.700 m Patient Age:    61 years          BP:           124/95 mmHg Patient Gender: M                 HR:           175 bpm. Exam Location:  Inpatient Procedure: 2D Echo Indications:    CHF  History:        Patient has no prior history of Echocardiogram examinations.  Sonographer:    Cathie Hoops Referring Phys: [phone] SEYED A SHAHMEHDI  Sonographer Comments: Technically difficult study due to poor echo windows and patient is obese. Image acquisition challenging due to patient body habitus and Image acquisition challenging due to respiratory motion. IMPRESSIONS  1. Poor acoustic windows. Non-diagnsotic study. Consider repeat Echo with contrast at normal heart rate. Comparison(s): No prior Echocardiogram. FINDINGS  Left Ventricle: Poor acoustic windows. Non-diagnsotic study. Consider repeat Echo with contrast at normal heart rate.  LEFT VENTRICLE PLAX 2D LVIDd:         4.90 cm   Diastology LVIDs:         3.60 cm   LV e' medial:    6.02 cm/s LV PW:         1.20 cm   LV E/e' medial:  15.2 LV IVS:        1.20 cm   LV e' lateral:   10.13 cm/s LVOT diam:     2.50 cm   LV E/e' lateral: 9.0 LVOT Area:     4.91 cm  LEFT ATRIUM           Index LA diam:      4.00 cm 1.48 cm/m LA Vol (A4C): 75.3 ml 27.89 ml/m                        PULMONIC VALVE AORTA                 PV Vmax:       1.25 m/s Ao Root diam: 3.90 cm PV Peak grad:  6.2 mmHg Ao Asc diam:  3.60 cm  MITRAL VALVE MV Area (PHT): 5.16 cm    SHUNTS MV Decel Time: 147 msec    Systemic Diam: 2.50 cm MR Peak grad: 10.0 mmHg MR Vmax:      158.00 cm/s MV E velocity: 91.57 cm/s MV A velocity: 34.10 cm/s MV E/A ratio:  2.69 Vishnu Priya Mallipeddi Electronically signed by Winfield Rast Mallipeddi Signature Date/Time: 12/30/2022/11:48:24 AM    Final    DG Abd 1 View  Result Date: 12/30/2022 CLINICAL DATA:  Abdominal pain. EXAM: ABDOMEN - 1 VIEW COMPARISON:  Scout image for CT abdomen/pelvis 06/01/2021 FINDINGS: Extremely marked gaseous dilatation of the  stomach noted. This is associated with diffuse small bowel dilatation with dilated gas-filled small bowel loops measuring up to at least 6.3 cm diameter. Best boy  in stool are seen in the colon. Bones are demineralized. Status post right hip replacement. IMPRESSION: 1. Extremely marked gaseous dilatation of the stomach. 2. Diffuse marked gaseous small bowel dilatation with dilatedloops measuring up to 6.3 cm diameter. Imaging features highly concerning for small bowel obstruction. Abdomen/pelvis CT with contrast would likely prove helpful to further evaluate. Electronically Signed   By: Misty Stanley M.D.   On: 12/30/2022 11:05    Anti-infectives: Anti-infectives (From admission, onward)    None       Assessment/Plan: SBO -no urgent need for surgery -will do sbo protocol today -surgery if no improvement of worsening but hopefully will resolve on its own  I reviewed hospitalist notes, last 24 h vitals and pain scores, last 48 h intake and output, last 24 h labs and trends, and last 24 h imaging results.  This care required moderate level of medical decision making.   Rolm Bookbinder 12/31/2022

## 2022-12-31 NOTE — Consult Note (Addendum)
This is a progress note    NAME:  Timothy Macias, MRN:  [id number], DOB:  07-Apr-1961, LOS: 3 ADMISSION DATE:  12/28/2022, CONSULTATION DATE:  1/7 REFERRING MD:  Julien Girt, CHIEF COMPLAINT:  encephalopathy   History of Present Illness:  Timothy Macias is a 62 y/o gentleman with a history of Afib, HTN, previous SBO due to strangulated peri-umbilical hernia s/p ex-lap, OSA on CPAP who presented with 3 days of nausea, abdominal pain, and distention on 1/6. He began having nausea and vomiting in the ED. His vomiting was described as brown.  He has been feeling poorly overall since a few days before Christmas.  He had worse edema for the past few weeks and was treated by his PCP with escalating doses of diuretics, which she did not feel were beneficial.  He has not recently had constipation, he was still having bowel movements prior to his abdominal symptoms starting.  Today he has had uncontrolled A-fib with RVR and is now on amiodarone infusion.  The rapid response nurse has had to check on him multiple times throughout the day.  He had a KUB demonstrating a bowel obstruction.  NG tube was placed with rapid removal of about 3 L of gastric fluid.  He has become progressively more somnolent throughout the day.  He has OSA and is compliant with CPAP at home.  Last dose of Eliquis was this morning.  Pertinent  Medical History  Hernia surgery- ex lap with bowel resection and primary anastamosis OSA NASH Afib HTN HFpEF, mild PH Peripheral neuropathy on Lyrica; unsure etiology of neuropathy GERD Remote tobacco abuse-quit 10 years ago, multiple packs per day  Significant Hospital Events: Including procedures, antibiotic start and stop dates in addition to other pertinent events   1/6 admitted 1/7 amiodarone infusion, IVF, NGT placed to decompress abdomen. Transferring to ICU.  Interim History / Subjective:  No events. Mental status seems ok. Remains in Afib/RVR. CCS and cardiology have seen. Good  diuresis. Unable to place foley due to anatomic issues. Denies pain.  Objective   Blood pressure (!) 111/91, pulse (!) 147, temperature 97.7 F (36.5 C), temperature source Oral, resp. rate 19, height 5\' 8"  (1.727 m), weight (!) 173.1 kg, SpO2 96 %.    FiO2 (%):  [60 %-100 %] 60 %   Intake/Output Summary (Last 24 hours) at 12/31/2022 0835 Last data filed at 12/31/2022 0600 Gross per 24 hour  Intake 3647.19 ml  Output 03/01/2023 ml  Net -7177.81 ml    Filed Weights   12/30/22 0345 12/31/22 0500  Weight: (!) 176.8 kg (!) 173.1 kg    Examination: No distress BIPAP in place with good seal Distant heart/lung sounds due to body habitus, per nursing has some wheezing, I cannot hear unfortunately Ext with anasarca Abdomen distended, nontender, hypoactive BS NGT with bilious output  Cr stably elevated WBC improved Sugars ok CXR L base obscured (1/7)  Resolved Hospital Problem list     Assessment & Plan:  SBO- ongoing issue Afib/RVR- ongoing issue Acute on chronic hypercapnea (OHS/OSA) Class 3 obesity AKI on CKD- with volume overload NASH HTN  - Amio and consideration for DCCV per cards/EP discussion - Contrast per NGT and delayed KUB per CCS, continuing to try conservative management given comorbidities - Continue IV diuretics - Continue BIPAP qHS and PRN, will see how he does off of this today - Keep in ICU, high risk for further aspiration and deterioration given fragile baseline cardiopulmonary status  Best Practice (right click and "Reselect  all SmartList Selections" daily)   Diet/type: NPO DVT prophylaxis: systemic heparin GI prophylaxis: N/A Lines: N/A Foley:  N/A Code Status:  full code Last date of multidisciplinary goals of care discussion [wife and patient updated 1/7]  Consulting services: cardiology, EP, CCS  34 min cc time Erskine Emery MD PCCM

## 2022-12-31 NOTE — Progress Notes (Signed)
PT Cancellation Note  Patient Details Name: Timothy Macias MRN: [id number] DOB: 1961/01/05   Cancelled Treatment:    Reason Eval/Treat Not Completed: Medical issues which prohibited therapy (HR 140-170's at rest)  Wyona Almas, PT, [place] [facility] 12/31/2022, 10:58 AM

## 2022-12-31 NOTE — Progress Notes (Signed)
Rounding Note    Patient Name: Timothy Macias Date of Encounter: 12/31/2022  [facility] Cardiologist: Lalla Brothers and [facility]  Subjective   On bipap with NG tube draining sats ok   Inpatient Medications    Scheduled Meds:  amiodarone  150 mg Intravenous Once   arformoterol  15 mcg Nebulization BID   Chlorhexidine Gluconate Cloth  6 each Topical Daily   insulin aspart  1-3 Units Subcutaneous Q4H   naloxone  0.4 mg Intramuscular Once   pantoprazole (PROTONIX) IV  40 mg Intravenous Q24H   pregabalin  100 mg Oral Daily   revefenacin  175 mcg Nebulization Daily   tamsulosin  0.4 mg Oral Daily   Continuous Infusions:  amiodarone 60 mg/hr (12/31/22 0600)   heparin 2,100 Units/hr (12/31/22 0600)   PRN Meds: acetaminophen, ondansetron (ZOFRAN) IV   Vital Signs    Vitals:   12/31/22 0545 12/31/22 0600 12/31/22 0615 12/31/22 0741  BP:  110/80  (!) 111/91  Pulse: 90 (!) 114 92 (!) 147  Resp: 18 19 20 19   Temp:    97.7 F (36.5 C)  TempSrc:    Oral  SpO2: 100% 99% 98% 96%  Weight:      Height:        Intake/Output Summary (Last 24 hours) at 12/31/2022 0803 Last data filed at 12/31/2022 0600 Gross per 24 hour  Intake 3647.19 ml  Output 03/01/2023 ml  Net -7177.81 ml      12/31/2022    5:00 AM 12/30/2022    3:45 AM 02/15/2022   11:09 AM  Last 3 Weights  Weight (lbs) 381 lb 9.9 oz 389 lb 12.8 oz 306 lb  Weight (kg) 173.1 kg 176.812 kg 138.801 kg      Telemetry    AF w/ RVR, rates 130-160s - Personally Reviewed  ECG    AF w/ RVR. RBBB. LAFB - Personally Reviewed  Physical Exam   Super morbid obesity Bipap NG tube draining Abdomen distended not acute Lungs clear anteriorly Distant heart sounds Plus 1-2 edema  Labs    High Sensitivity Troponin:   Recent Labs  Lab 12/28/22 1400 12/28/22 1620 12/29/22 1556 12/29/22 1713  TROPONINIHS 9 8 7 7      Chemistry Recent Labs  Lab 12/28/22 1334 12/28/22 1335 12/29/22 0552 12/30/22 1512 12/30/22 2105  12/31/22 0258 12/31/22 0259  NA  --  133*   < > 131* 132* 133* 134*  K  --  3.5   < > 3.0* 3.2* 4.1 4.2  CL  --  90*   < > 86*  --  88* 90*  CO2  --  29   < > 30  --  28 22  GLUCOSE  --  112*   < > 114*  --  91 90  BUN  --  47*   < > 60*  --  61* 63*  CREATININE  --  2.40*   < > 2.46*  --  2.33* 2.29*  CALCIUM  --  8.9   < > 8.3*  --  8.1* 8.1*  MG 1.8  --   --  2.1  --  2.2  --   PROT  --  7.4  --  6.7  --   --   --   ALBUMIN  --  3.7  --  3.2*  --   --  3.1*  AST  --  19  --  22  --   --   --  ALT  --  14  --  13  --   --   --   ALKPHOS  --  73  --  51  --   --   --   BILITOT  --  1.3*  --  1.2  --   --   --   GFRNONAA  --  30*   < > 29*  --  31* 32*  ANIONGAP  --  14   < > 15  --  17* 22*   < > = values in this interval not displayed.    Lipids No results for input(s): "CHOL", "TRIG", "HDL", "LABVLDL", "LDLCALC", "CHOLHDL" in the last 168 hours.  Hematology Recent Labs  Lab 12/30/22 0546 12/30/22 1512 12/30/22 2105 12/31/22 0258  WBC 3.7* 3.7*  --  5.6  RBC 7.07* 6.77*  --  6.64*  HGB 18.4* 17.9* 18.4* 17.1*  HCT 59.1* 55.8* 54.0* 56.0*  MCV 83.6 82.4  --  84.3  MCH 26.0 26.4  --  25.8*  MCHC 31.1 32.1  --  30.5  RDW 21.1* 20.6*  --  20.3*  PLT 240 212  --  196   Thyroid  Recent Labs  Lab 12/30/22 1450  TSH 0.657    BNP Recent Labs  Lab 12/28/22 1335 12/29/22 0552 12/31/22 0259  BNP 179.0* 178.0* 72.7    DDimer  Recent Labs  Lab 12/30/22 1512  DDIMER 3.47*     Radiology    DG Chest Port 1 View  Result Date: 12/30/2022 CLINICAL DATA:  Shortness of breath EXAM: PORTABLE CHEST 1 VIEW COMPARISON:  Chest x-ray 12/28/2022 FINDINGS: Enteric tube extends below the diaphragm. The heart is enlarged. There is central pulmonary vascular congestion. There are patchy opacities in the left lung base questionable small left pleural effusion. There is no pneumothorax or acute fracture. IMPRESSION: 1. Cardiomegaly with central pulmonary vascular congestion. 2. Patchy  opacities in the left lung base could be atelectasis or infiltrate. Electronically Signed   By: Darliss Cheney M.D.   On: 12/30/2022 23:31   CT ABDOMEN PELVIS WO CONTRAST  Result Date: 12/30/2022 CLINICAL DATA:  Three days of nausea, abdominal pain, and distention. Concern for bowel obstruction. EXAM: CT ABDOMEN AND PELVIS WITHOUT CONTRAST TECHNIQUE: Multidetector CT imaging of the abdomen and pelvis was performed following the standard protocol without IV contrast. RADIATION DOSE REDUCTION: This exam was performed according to the departmental dose-optimization program which includes automated exposure control, adjustment of the mA and/or kV according to patient size and/or use of iterative reconstruction technique. COMPARISON:  CT abdomen pelvis dated 06/01/2021. FINDINGS: Lower chest: There is moderate bilateral lower lobe atelectasis. Heart is enlarged. Hepatobiliary: No focal liver abnormality is seen. No gallstones, gallbladder wall thickening, or biliary dilatation. Pancreas: Unremarkable. No pancreatic ductal dilatation or surrounding inflammatory changes. Spleen: Normal in size without focal abnormality. Adrenals/Urinary Tract: Adrenal glands are unremarkable. Bilateral renal cysts are redemonstrated with the largest measuring 4.9 cm on the right and 9.0 x 6.2 cm on the left. Bilateral nonobstructing calculi are seen, measuring up to 8 x 5 mm on the right and 8 x 5 mm on the left. There is no hydronephrosis on either side. The urinary bladder is unremarkable. Stomach/Bowel: An enteric tube terminates in the stomach. Stomach is within normal limits. Appendix appears normal. Multiple dilated loops of small bowel are seen with a transition 0.2 distal decompressed small bowel in the lower mid abdomen. There is colonic diverticulosis without evidence of diverticulitis. Vascular/Lymphatic: Aortic  atherosclerosis. No enlarged abdominal or pelvic lymph nodes. Reproductive: The prostate is obscured by streak  artifact. Other: The patient is status post anterior abdominal wall hernia repair. No abdominal wall hernia is identified on today's exam. No free intraperitoneal fluid. Musculoskeletal: The patient is status post a total hip arthroplasty on the right which obscures portions of the pelvis. Degenerative changes are seen in the spine. IMPRESSION: 1. High-grade small-bowel obstruction with a transition point in the lower mid abdomen. No abdominal wall hernia. 2. Moderate bilateral lower lobe atelectasis. 3. Cardiomegaly. Aortic Atherosclerosis (ICD10-I70.0). Electronically Signed   By: Romona Curls M.D.   On: 12/30/2022 22:03   CT HEAD WO CONTRAST ( )  Result Date: 12/30/2022 CLINICAL DATA:  Altered mental status. EXAM: CT HEAD WITHOUT CONTRAST TECHNIQUE: Contiguous axial images were obtained from the base of the skull through the vertex without intravenous contrast. RADIATION DOSE REDUCTION: This exam was performed according to the departmental dose-optimization program which includes automated exposure control, adjustment of the mA and/or kV according to patient size and/or use of iterative reconstruction technique. COMPARISON:  CT head dated 06/04/2019 FINDINGS: Brain: No evidence of acute infarction, hemorrhage, hydrocephalus, extra-axial collection or mass lesion/mass effect. Periventricular white matter hypoattenuation likely represents chronic small vessel ischemic disease. Vascular: There are vascular calcifications in the carotid siphons. Skull: Normal. Negative for fracture or focal lesion. Sinuses/Orbits: No acute finding. Other: A nasogastric tube is partially imaged. IMPRESSION: 1. No acute intracranial process. Electronically Signed   By: Romona Curls M.D.   On: 12/30/2022 21:53   DG Abd 1 View  Result Date: 12/30/2022 CLINICAL DATA:  Enteric tube placement. EXAM: ABDOMEN - 1 VIEW COMPARISON:  Abdominal radiograph dated 12/30/2022. FINDINGS: An enteric tube terminates in the stomach. Multiple  gas-filled loops of bowel are partially imaged. IMPRESSION: An enteric tube terminates in the stomach. Electronically Signed   By: Romona Curls M.D.   On: 12/30/2022 17:17   DG Abd Portable 1V  Result Date: 12/30/2022 CLINICAL DATA:  NG tube placement EXAM: PORTABLE ABDOMEN - 1 VIEW COMPARISON:  12/30/2022, 10:31 a.m. FINDINGS: Esophagogastric tube is positioned with tip in the stomach, side port at the level of the gastroesophageal junction. Stomach and included small bowel remain gas distended. No obvious free air. IMPRESSION: 1. Esophagogastric tube is positioned with tip in the stomach, side port at the level of the gastroesophageal junction. Consider advancement to ensure subdiaphragmatic positioning. 2. Stomach and included small bowel remain gas distended consistent with ileus or obstruction. Electronically Signed   By: Jearld Lesch M.D.   On: 12/30/2022 15:43   US RENAL  Result Date: 12/30/2022 CLINICAL DATA:  Acute renal insufficiency EXAM: RENAL / URINARY TRACT ULTRASOUND COMPLETE COMPARISON:  Renal ultrasound August 21, 2022 FINDINGS: Right Kidney: Renal measurements: 11.5 x 6.0 x 7.1 cm = volume: 255 mL. Echogenicity within normal limits. No mass or hydronephrosis visualized. Left Kidney: A 7.8 x 6.5 x 6.8 cm cyst is seen in the expected location of the left kidney. The left kidney which was identified on the August 21, 2022 study is not visualized on provided images. Bladder: The bladder is empty limiting evaluation. Other: None. IMPRESSION: 1. A 7.8 x 6.5 x 6.8 cm cyst is seen in the expected location of the left kidney. The left kidney which was identified on the August 21, 2022 study is not visualized on provided images. 2. The right kidney is normal. 3. The bladder is empty limiting evaluation. Electronically Signed   By: Gerome Sam  III M.D.   On: 12/30/2022 14:31   ECHOCARDIOGRAM COMPLETE  Result Date: 12/30/2022    ECHOCARDIOGRAM REPORT   Patient Name:   DAVIDLEE JEANBAPTISTE Date of  Exam: 12/30/2022 Medical Rec #:  [id number]         Height:       67.5 in Accession #:    [id number]        Weight:       389.8 lb Date of Birth:  [birth year]          BSA:          2.700 m Patient Age:    62 years          BP:           124/95 mmHg Patient Gender: M                 HR:           175 bpm. Exam Location:  Inpatient Procedure: 2D Echo Indications:    CHF  History:        Patient has no prior history of Echocardiogram examinations.  Sonographer:    Harvie Junior Referring Phys: [phone] SEYED A SHAHMEHDI  Sonographer Comments: Technically difficult study due to poor echo windows and patient is obese. Image acquisition challenging due to patient body habitus and Image acquisition challenging due to respiratory motion. IMPRESSIONS  1. Poor acoustic windows. Non-diagnsotic study. Consider repeat Echo with contrast at normal heart rate. Comparison(s): No prior Echocardiogram. FINDINGS  Left Ventricle: Poor acoustic windows. Non-diagnsotic study. Consider repeat Echo with contrast at normal heart rate.  LEFT VENTRICLE PLAX 2D LVIDd:         4.90 cm   Diastology LVIDs:         3.60 cm   LV e' medial:    6.02 cm/s LV PW:         1.20 cm   LV E/e' medial:  15.2 LV IVS:        1.20 cm   LV e' lateral:   10.13 cm/s LVOT diam:     2.50 cm   LV E/e' lateral: 9.0 LVOT Area:     4.91 cm  LEFT ATRIUM           Index LA diam:      4.00 cm 1.48 cm/m LA Vol (A4C): 75.3 ml 27.89 ml/m                        PULMONIC VALVE AORTA                 PV Vmax:       1.25 m/s Ao Root diam: 3.90 cm PV Peak grad:  6.2 mmHg Ao Asc diam:  3.60 cm  MITRAL VALVE MV Area (PHT): 5.16 cm    SHUNTS MV Decel Time: 147 msec    Systemic Diam: 2.50 cm MR Peak grad: 10.0 mmHg MR Vmax:      158.00 cm/s MV E velocity: 91.57 cm/s MV A velocity: 34.10 cm/s MV E/A ratio:  2.69 Vishnu Priya Mallipeddi Electronically signed by Lorelee Cover Mallipeddi Signature Date/Time: 12/30/2022/11:48:24 AM    Final    DG Abd 1 View  Result Date: 12/30/2022 CLINICAL  DATA:  Abdominal pain. EXAM: ABDOMEN - 1 VIEW COMPARISON:  Scout image for CT abdomen/pelvis 06/01/2021 FINDINGS: Extremely marked gaseous dilatation of the stomach noted. This is associated with diffuse small bowel dilatation with dilated gas-filled small bowel  loops measuring up to at least 6.3 cm diameter. Air in stool are seen in the colon. Bones are demineralized. Status post right hip replacement. IMPRESSION: 1. Extremely marked gaseous dilatation of the stomach. 2. Diffuse marked gaseous small bowel dilatation with dilatedloops measuring up to 6.3 cm diameter. Imaging features highly concerning for small bowel obstruction. Abdomen/pelvis CT with contrast would likely prove helpful to further evaluate. Electronically Signed   By: Kennith Center M.D.   On: 12/30/2022 11:05      Assessment & Plan    #Chronic AF On amiodarone gtt, digoxin, metoprolol Eliquis changed to heparin given below  Rebolus with amiodarone Tentatively plan [facility] in am after more iv amiodarone He has had previous ablation  Will discuss with Dr Lalla Brothers if [facility] will be in EP lab with 360 j defibrillator Currently on bipap with NG tube draining not ready for anesthesia today  Concern for [facility] then having to stop anticoagulation for surgery    #Chronic diastolic heart failure NYHA III-IV. Symptoms related to HF and weight. Continue IV diuresis BID. Daily weights. Keep K>4, Mg>2 Good diuresis   #Morbid obesity  #Abdominal pain/nausea CT with high grade small bowel obstruction  NG tube  Patient seems to down play any pain  Dr Cliffton Asters general surgery apparently aware no plans for OR Eliquis transitioned to heparin      For questions or updates, please contact [facility] Please consult [email or web address] for contact info under        Signed, Charlton Haws, MD  12/31/2022, 8:03 AM

## 2022-12-31 NOTE — Progress Notes (Signed)
ANTICOAGULATION CONSULT NOTE   Pharmacy Consult for heparin Indication: atrial fibrillation  Allergies  Allergen Reactions   Albuterol Swelling   Amiodarone Rash   Penicillins Hives    Patient Measurements: Height: 5\' 8"  (172.7 cm) Weight: (!) 173.1 kg (381 lb 9.9 oz) IBW/kg (Calculated) : 68.4 Heparin Dosing Weight: 113kg  Vital Signs: Temp: 98.5 F (36.9 C) (01/08 1126) Temp Source: Oral (01/08 1126) BP: 121/87 (01/08 1200) Pulse Rate: 117 (01/08 1200)  Labs: Recent Labs    12/28/22 1620 12/29/22 0552 12/29/22 1556 12/29/22 1713 12/30/22 0145 12/30/22 0546 12/30/22 1416 12/30/22 1512 12/30/22 2105 12/31/22 0258 12/31/22 0259 12/31/22 1152  HGB  --    < >  --   --   --  18.4*  --  17.9* 18.4* 17.1*  --   --   HCT  --    < >  --   --   --  59.1*  --  55.8* 54.0* 56.0*  --   --   PLT  --    < >  --   --   --  240  --  212  --  196  --   --   APTT  --   --   --   --   --   --   --   --   --  45*  --  72*  HEPARINUNFRC  --   --   --   --   --   --   --   --   --   --  >1.10*  --   CREATININE  --    < >  --   --    < >  --    < > 2.46*  --  2.33* 2.29*  --   TROPONINIHS 8  --  7 7  --   --   --   --   --   --   --   --    < > = values in this interval not displayed.     Estimated Creatinine Clearance: 52.8 mL/min (A) (by C-G formula based on SCr of 2.29 mg/dL (H)).   Medical History: Past Medical History:  Diagnosis Date   Atrial fibrillation (HCC)    CHF (congestive heart failure) (HCC)    Fatty liver    Hypertension    Sleep apnea    wears CPAP    Medications:  Medications Prior to Admission  Medication Sig Dispense Refill Last Dose   acetaminophen (TYLENOL) 500 MG tablet Take 1,000 mg by mouth every 8 (eight) hours as needed for mild pain.   Past Month   amiodarone (PACERONE) 200 MG tablet Take 400 mg by mouth daily.   12/28/2022 at 1400   apixaban (ELIQUIS) 5 MG TABS tablet Take 5 mg by mouth daily.   12/28/2022 at 0800   cyclobenzaprine (FLEXERIL)  10 MG tablet Take 10 mg by mouth 3 (three) times daily as needed for muscle spasms.   Past Week   diphenhydramine-acetaminophen (TYLENOL PM) 25-500 MG TABS tablet Take 2 tablets by mouth at bedtime as needed.   12/28/2022   DULoxetine (CYMBALTA) 60 MG capsule Take 60 mg by mouth 2 (two) times daily.   12/28/2022   ferrous sulfate 325 (65 FE) MG EC tablet Take 325 mg by mouth daily with breakfast.   12/28/2022   folic acid (FOLVITE) 1 MG tablet Take 1 mg by mouth daily.   12/28/2022   furosemide (  LASIX) 80 MG tablet Take 80 mg by mouth daily.   12/28/2022   LORazepam (ATIVAN) 0.5 MG tablet Take 0.5 mg by mouth every 8 (eight) hours as needed for anxiety.   12/28/2022   losartan (COZAAR) 25 MG tablet Take 12.5 mg by mouth daily.   12/28/2022   Magnesium 400 MG TABS Take 400 mg by mouth daily.   12/28/2022   melatonin 5 MG TABS Take 5-10 mg by mouth at bedtime as needed (sleep).   Past Month   metoprolol succinate (TOPROL-XL) 25 MG 24 hr tablet Take 75 mg by mouth daily.   12/28/2022 at 0800   Multiple Vitamins-Minerals (YOUR LIFE MULTI ADULT GUMMIES) CHEW Chew by mouth.   12/27/2022   oxycodone (ROXICODONE) 30 MG immediate release tablet Take 60 mg by mouth 3 (three) times daily as needed (pain).   12/28/2022   pantoprazole (PROTONIX) 40 MG tablet Take 1 tablet by mouth 2 (two) times daily.   12/28/2022   potassium chloride SA (K-DUR) 20 MEQ tablet Take 40 mEq by mouth daily.   12/28/2022   pregabalin (LYRICA) 100 MG capsule Take 100 mg by mouth at bedtime.   12/27/2022   promethazine (PHENERGAN) 25 MG tablet Take 25 mg by mouth every 6 (six) hours as needed for nausea or vomiting.   12/28/2022   SUMAtriptan (IMITREX) 100 MG tablet Take 100 mg by mouth every 2 (two) hours as needed for migraine.   unk   tamsulosin (FLOMAX) 0.4 MG CAPS capsule Take 0.8 mg by mouth daily.   12/28/2022   testosterone cypionate (DEPOTESTOSTERONE CYPIONATE) 200 MG/ML injection Inject 200 mg into the muscle once a week.   12/16/2022   Scheduled:    arformoterol  15 mcg Nebulization BID   Chlorhexidine Gluconate Cloth  6 each Topical Daily   furosemide  80 mg Intravenous Daily   insulin aspart  1-3 Units Subcutaneous Q4H   pantoprazole (PROTONIX) IV  40 mg Intravenous Q24H   potassium chloride  20 mEq Oral BID   revefenacin  175 mcg Nebulization Daily   tamsulosin  0.4 mg Oral Daily   Infusions:   amiodarone 60 mg/hr (12/31/22 1100)   heparin 2,100 Units/hr (12/31/22 0600)    Assessment: Pt with a hx of afib on apixaba who was admitted for possible bowel obstruction. Plan to bridge with heparin for possible surgery. Last apixaban dose 12/30/22 @ 8AM. Expecting heparin level to be elevated due to apixaban. We will use PTT to monitor until correlation.  Repeat aPTT is therapeutic at 72 seconds.  Goal of Therapy:  Heparin level 0.3-0.7 units/ml aPTT 66-102 seconds Monitor platelets by anticoagulation protocol: Yes   Plan:  Continue heparin 2100 units/h Daily aPTT, heparin level, CBC   Arrie Senate, PharmD, [place], [facility] Clinical Pharmacist [phone] Please check AMION for all [facility] Pharmacy numbers 12/31/2022

## 2022-12-31 NOTE — Progress Notes (Signed)
Per verbal order from ccm, RT removed pt from bipap and placed pt on 3 L [place] with bipap on STBY at bedside. RT will continue to monitor pt.

## 2023-01-01 ENCOUNTER — Encounter (HOSPITAL_COMMUNITY)
Admission: EM | Disposition: A | Discharge status: Rehabilitation Facility | Payer: Self-pay | Source: Home / Self Care | Attending: Pulmonary Disease

## 2023-01-01 ENCOUNTER — Encounter (HOSPITAL_COMMUNITY): Payer: Self-pay | Admitting: Internal Medicine

## 2023-01-01 ENCOUNTER — Inpatient Hospital Stay (HOSPITAL_COMMUNITY): Payer: BC Managed Care – PPO | Admitting: Certified Registered Nurse Anesthetist

## 2023-01-01 DIAGNOSIS — I480 Paroxysmal atrial fibrillation: Secondary | ICD-10-CM | POA: Diagnosis not present

## 2023-01-01 DIAGNOSIS — I4891 Unspecified atrial fibrillation: Secondary | ICD-10-CM

## 2023-01-01 DIAGNOSIS — I5032 Chronic diastolic (congestive) heart failure: Secondary | ICD-10-CM | POA: Diagnosis not present

## 2023-01-01 HISTORY — PX: CARDIOVERSION: SHX1299

## 2023-01-01 LAB — GLUCOSE, CAPILLARY
Glucose-Capillary: 106 mg/dL — ABNORMAL HIGH (ref 70–99)
Glucose-Capillary: 113 mg/dL — ABNORMAL HIGH (ref 70–99)
Glucose-Capillary: 127 mg/dL — ABNORMAL HIGH (ref 70–99)
Glucose-Capillary: 96 mg/dL (ref 70–99)
Glucose-Capillary: 96 mg/dL (ref 70–99)
Glucose-Capillary: 98 mg/dL (ref 70–99)
Glucose-Capillary: 99 mg/dL (ref 70–99)

## 2023-01-01 LAB — CBC
HCT: 55.4 % — ABNORMAL HIGH (ref 39.0–52.0)
Hemoglobin: 17.7 g/dL — ABNORMAL HIGH (ref 13.0–17.0)
MCH: 26.2 pg (ref 26.0–34.0)
MCHC: 31.9 g/dL (ref 30.0–36.0)
MCV: 82 fL (ref 80.0–100.0)
Platelets: 209 10*3/uL (ref 150–400)
RBC: 6.76 MIL/uL — ABNORMAL HIGH (ref 4.22–5.81)
RDW: 19.9 % — ABNORMAL HIGH (ref 11.5–15.5)
WBC: 6.2 10*3/uL (ref 4.0–10.5)
nRBC: 0 % (ref 0.0–0.2)

## 2023-01-01 LAB — BASIC METABOLIC PANEL
Anion gap: 19 — ABNORMAL HIGH (ref 5–15)
BUN: 56 mg/dL — ABNORMAL HIGH (ref 8–23)
CO2: 30 mmol/L (ref 22–32)
Calcium: 8.8 mg/dL — ABNORMAL LOW (ref 8.9–10.3)
Chloride: 86 mmol/L — ABNORMAL LOW (ref 98–111)
Creatinine, Ser: 1.98 mg/dL — ABNORMAL HIGH (ref 0.61–1.24)
GFR, Estimated: 38 mL/min — ABNORMAL LOW (ref >60)
Glucose, Bld: 84 mg/dL (ref 70–99)
Potassium: 3 mmol/L — ABNORMAL LOW (ref 3.5–5.1)
Sodium: 135 mmol/L (ref 135–145)

## 2023-01-01 LAB — BRAIN NATRIURETIC PEPTIDE: B Natriuretic Peptide: 82.1 pg/mL (ref 0.0–100.0)

## 2023-01-01 LAB — POTASSIUM: Potassium: 3.9 mmol/L (ref 3.5–5.1)

## 2023-01-01 LAB — HEPARIN LEVEL (UNFRACTIONATED): Heparin Unfractionated: >1.1 IU/mL — ABNORMAL HIGH (ref 0.30–0.70)

## 2023-01-01 LAB — MAGNESIUM: Magnesium: 2.3 mg/dL (ref 1.7–2.4)

## 2023-01-01 LAB — APTT: aPTT: 74 seconds — ABNORMAL HIGH (ref 24–36)

## 2023-01-01 SURGERY — CARDIOVERSION
Anesthesia: General

## 2023-01-01 MED ORDER — FUROSEMIDE 10 MG/ML IJ SOLN
60.0000 mg | Freq: Once | INTRAMUSCULAR | Status: AC
  Administered 2023-01-01: 60 mg via INTRAVENOUS
  Filled 2023-01-01: qty 6

## 2023-01-01 MED ORDER — METOPROLOL TARTRATE 50 MG PO TABS: 50.0000 mg | ORAL_TABLET | Freq: Two times a day (BID) | ORAL | Status: DC

## 2023-01-01 MED ORDER — HYDROMORPHONE HCL 1 MG/ML IJ SOLN
2.0000 mg | Freq: Four times a day (QID) | INTRAMUSCULAR | Status: DC | PRN
  Administered 2023-01-01 – 2023-01-02 (×3): 2 mg via INTRAVENOUS
  Filled 2023-01-01 (×3): qty 2

## 2023-01-01 MED ORDER — METOPROLOL TARTRATE 5 MG/5ML IV SOLN
5.0000 mg | INTRAVENOUS | Status: DC | PRN
  Administered 2023-01-01 – 2023-01-04 (×2): 5 mg via INTRAVENOUS
  Filled 2023-01-01 (×2): qty 5

## 2023-01-01 MED ORDER — LIDOCAINE 2% (20 MG/ML) 5 ML SYRINGE
INTRAMUSCULAR | Status: DC | PRN
  Administered 2023-01-01: 60 mg via INTRAVENOUS

## 2023-01-01 MED ORDER — MENTHOL 3 MG MT LOZG: 1.0000 | LOZENGE | OROMUCOSAL | Status: DC | PRN

## 2023-01-01 MED ORDER — MORPHINE SULFATE (PF) 2 MG/ML IV SOLN
2.0000 mg | Freq: Once | INTRAVENOUS | Status: AC | PRN
  Administered 2023-01-01: 2 mg via INTRAVENOUS
  Filled 2023-01-01: qty 1

## 2023-01-01 MED ORDER — POTASSIUM CHLORIDE 10 MEQ/100ML IV SOLN
10.0000 meq | INTRAVENOUS | Status: AC
  Administered 2023-01-01 (×3): 10 meq via INTRAVENOUS
  Filled 2023-01-01 (×2): qty 100

## 2023-01-01 MED ORDER — PHENOL 1.4 % MT LIQD
1.0000 | OROMUCOSAL | Status: DC | PRN
  Administered 2023-01-01: 1 via OROMUCOSAL
  Filled 2023-01-01: qty 177

## 2023-01-01 MED ORDER — POTASSIUM CHLORIDE 10 MEQ/100ML IV SOLN
10.0000 meq | INTRAVENOUS | Status: AC
  Administered 2023-01-01 (×5): 10 meq via INTRAVENOUS
  Filled 2023-01-01 (×6): qty 100

## 2023-01-01 MED ORDER — PROPOFOL 10 MG/ML IV BOLUS
INTRAVENOUS | Status: DC | PRN
  Administered 2023-01-01: 40 mg via INTRAVENOUS
  Administered 2023-01-01: 30 mg via INTRAVENOUS

## 2023-01-01 NOTE — Transfer of Care (Signed)
Immediate Anesthesia Transfer of Care Note  Patient: Timothy Macias  Procedure(s) Performed: CARDIOVERSION  Patient Location: Endoscopy Unit  Anesthesia Type:General  Level of Consciousness: drowsy  Airway & Oxygen Therapy: Patient Spontanous Breathing and Patient connected to nasal cannula oxygen  Post-op Assessment: Report given to RN and Post -op Vital signs reviewed and stable  Post vital signs: Reviewed and stable  Last Vitals:  Vitals Value Taken Time  BP 125/81 (95)   Temp    Pulse 109   Resp 16   SpO2 112     Last Pain:  Vitals:   01/01/23 1145  TempSrc: Oral  PainSc:       Patients Stated Pain Goal: 0 (96/22/29 7989)  Complications: No notable events documented.

## 2023-01-01 NOTE — H&P (View-Only) (Signed)
 Rounding Note    Patient Name: Timothy Macias Date of Encounter: 01/01/2023  [facility] Cardiologist: [facility]/ and [facility]  Subjective   Breathing fine NG tube in Denies a lot of abdominal pain surprisingly   Inpatient Medications    Scheduled Meds:  arformoterol  15 mcg Nebulization BID   Chlorhexidine Gluconate Cloth  6 each Topical Daily   insulin aspart  1-3 Units Subcutaneous Q4H   metoprolol tartrate  50 mg Per NG tube BID   pantoprazole (PROTONIX) IV  40 mg Intravenous Q24H   revefenacin  175 mcg Nebulization Daily   tamsulosin  0.4 mg Oral Daily   Continuous Infusions:  sodium chloride Stopped (01/01/23 0540)   amiodarone 60 mg/hr (01/01/23 0800)   heparin 2,100 Units/hr (01/01/23 0800)   potassium chloride 10 mEq (01/01/23 0810)   PRN Meds: acetaminophen, HYDROmorphone (DILAUDID) injection, menthol-cetylpyridinium, ondansetron (ZOFRAN) IV, phenol   Vital Signs    Vitals:   01/01/23 0700 01/01/23 0735 01/01/23 0800 01/01/23 0843  BP: 127/78  (!) 131/106 117/87  Pulse: (!) 112 (!) 143 (!) 129 (!) 114  Resp: 14 20 20 13  Temp:   98.2 F (36.8 C)   TempSrc:   Oral   SpO2: 95% 96% 97% 99%  Weight:      Height:        Intake/Output Summary (Last 24 hours) at 01/01/2023 0856 Last data filed at 01/01/2023 0810 Gross per 24 hour  Intake 1811.78 ml  Output 5625 ml  Net -3813.22 ml      01/01/2023    5:00 AM 12/31/2022    5:00 AM 12/30/2022    3:45 AM  Last 3 Weights  Weight (lbs) 372 lb 5.7 oz 381 lb 9.9 oz 389 lb 12.8 oz  Weight (kg) 168.9 kg 173.1 kg 176.812 kg      Telemetry    AF w/ RVR, rates 130-160s - Personally Reviewed  ECG    AF w/ RVR. RBBB. LAFB - Personally Reviewed  Physical Exam   Super morbid obesity Bipap NG tube draining Abdomen distended not acute Lungs clear anteriorly Distant heart sounds Plus 1-2 edema  Labs    High Sensitivity Troponin:   Recent Labs  Lab 12/28/22 1400 12/28/22 1620 12/29/22 1556  12/29/22 1713  TROPONINIHS 9 8 7 7     Chemistry Recent Labs  Lab 12/28/22 1335 12/29/22 0552 12/30/22 1512 12/30/22 2105 12/31/22 0258 12/31/22 0259 01/01/23 0210  NA 133*   < > 131*   < > 133* 134* 135  K 3.5   < > 3.0*   < > 4.1 4.2 3.0*  CL 90*   < > 86*  --  88* 90* 86*  CO2 29   < > 30  --  28 22 30  GLUCOSE 112*   < > 114*  --  91 90 84  BUN 47*   < > 60*  --  61* 63* 56*  CREATININE 2.40*   < > 2.46*  --  2.33* 2.29* 1.98*  CALCIUM 8.9   < > 8.3*  --  8.1* 8.1* 8.8*  MG  --   --  2.1  --  2.2  --  2.3  PROT 7.4  --  6.7  --   --   --   --   ALBUMIN 3.7  --  3.2*  --   --  3.1*  --   AST 19  --  22  --   --   --   --     ALT 14  --  13  --   --   --   --   ALKPHOS 73  --  51  --   --   --   --   BILITOT 1.3*  --  1.2  --   --   --   --   GFRNONAA 30*   < > 29*  --  31* 32* 38*  ANIONGAP 14   < > 15  --  17* 22* 19*   < > = values in this interval not displayed.    Lipids No results for input(s): "CHOL", "TRIG", "HDL", "LABVLDL", "LDLCALC", "CHOLHDL" in the last 168 hours.  Hematology Recent Labs  Lab 12/30/22 1512 12/30/22 2105 12/31/22 0258 01/01/23 0210  WBC 3.7*  --  5.6 6.2  RBC 6.77*  --  6.64* 6.76*  HGB 17.9* 18.4* 17.1* 17.7*  HCT 55.8* 54.0* 56.0* 55.4*  MCV 82.4  --  84.3 82.0  MCH 26.4  --  25.8* 26.2  MCHC 32.1  --  30.5 31.9  RDW 20.6*  --  20.3* 19.9*  PLT 212  --  196 209   Thyroid  Recent Labs  Lab 12/30/22 1450  TSH 0.657    BNP Recent Labs  Lab 12/30/22 1306 12/31/22 0259 01/01/23 0210  BNP 173.4* 72.7 82.1    DDimer  Recent Labs  Lab 12/30/22 1512  DDIMER 3.47*     Radiology    DG Abd Portable 1V-Small Bowel Obstruction Protocol-initial, 8 hr delay  Result Date: 12/31/2022 CLINICAL DATA:  Small-bowel obstruction EXAM: PORTABLE ABDOMEN - 1 VIEW COMPARISON:  CT abdomen and pelvis 12/30/2022 FINDINGS: There are numerous air-filled dilated small bowel loops in the central abdomen. These measure up to 7.5 cm, similar to the  prior study. Minimal colonic gas is present. Nasogastric tube tip is at the level of the mid stomach, unchanged. Right hip arthroplasty is present. Visualized lung bases are clear. IMPRESSION: Persistent dilated small bowel loops in the central abdomen compatible with small-bowel obstruction. Electronically Signed   By: Amy  Guttmann M.D.   On: 12/31/2022 20:10   DG Chest Port 1 View  Result Date: 12/30/2022 CLINICAL DATA:  Shortness of breath EXAM: PORTABLE CHEST 1 VIEW COMPARISON:  Chest x-ray 12/28/2022 FINDINGS: Enteric tube extends below the diaphragm. The heart is enlarged. There is central pulmonary vascular congestion. There are patchy opacities in the left lung base questionable small left pleural effusion. There is no pneumothorax or acute fracture. IMPRESSION: 1. Cardiomegaly with central pulmonary vascular congestion. 2. Patchy opacities in the left lung base could be atelectasis or infiltrate. Electronically Signed   By: Amy  Guttmann M.D.   On: 12/30/2022 23:31   CT ABDOMEN PELVIS WO CONTRAST  Result Date: 12/30/2022 CLINICAL DATA:  Three days of nausea, abdominal pain, and distention. Concern for bowel obstruction. EXAM: CT ABDOMEN AND PELVIS WITHOUT CONTRAST TECHNIQUE: Multidetector CT imaging of the abdomen and pelvis was performed following the standard protocol without IV contrast. RADIATION DOSE REDUCTION: This exam was performed according to the departmental dose-optimization program which includes automated exposure control, adjustment of the mA and/or kV according to patient size and/or use of iterative reconstruction technique. COMPARISON:  CT abdomen pelvis dated 06/01/2021. FINDINGS: Lower chest: There is moderate bilateral lower lobe atelectasis. Heart is enlarged. Hepatobiliary: No focal liver abnormality is seen. No gallstones, gallbladder wall thickening, or biliary dilatation. Pancreas: Unremarkable. No pancreatic ductal dilatation or surrounding inflammatory changes. Spleen:  Normal in size without focal abnormality.   Adrenals/Urinary Tract: Adrenal glands are unremarkable. Bilateral renal cysts are redemonstrated with the largest measuring 4.9 cm on the right and 9.0 x 6.2 cm on the left. Bilateral nonobstructing calculi are seen, measuring up to 8 x 5 mm on the right and 8 x 5 mm on the left. There is no hydronephrosis on either side. The urinary bladder is unremarkable. Stomach/Bowel: An enteric tube terminates in the stomach. Stomach is within normal limits. Appendix appears normal. Multiple dilated loops of small bowel are seen with a transition 0.2 distal decompressed small bowel in the lower mid abdomen. There is colonic diverticulosis without evidence of diverticulitis. Vascular/Lymphatic: Aortic atherosclerosis. No enlarged abdominal or pelvic lymph nodes. Reproductive: The prostate is obscured by streak artifact. Other: The patient is status post anterior abdominal wall hernia repair. No abdominal wall hernia is identified on today's exam. No free intraperitoneal fluid. Musculoskeletal: The patient is status post a total hip arthroplasty on the right which obscures portions of the pelvis. Degenerative changes are seen in the spine. IMPRESSION: 1. High-grade small-bowel obstruction with a transition point in the lower mid abdomen. No abdominal wall hernia. 2. Moderate bilateral lower lobe atelectasis. 3. Cardiomegaly. Aortic Atherosclerosis (ICD10-I70.0). Electronically Signed   By: Romona Curls M.D.   On: 12/30/2022 22:03   CT HEAD WO CONTRAST ( )  Result Date: 12/30/2022 CLINICAL DATA:  Altered mental status. EXAM: CT HEAD WITHOUT CONTRAST TECHNIQUE: Contiguous axial images were obtained from the base of the skull through the vertex without intravenous contrast. RADIATION DOSE REDUCTION: This exam was performed according to the departmental dose-optimization program which includes automated exposure control, adjustment of the mA and/or kV according to patient size and/or  use of iterative reconstruction technique. COMPARISON:  CT head dated 06/04/2019 FINDINGS: Brain: No evidence of acute infarction, hemorrhage, hydrocephalus, extra-axial collection or mass lesion/mass effect. Periventricular white matter hypoattenuation likely represents chronic small vessel ischemic disease. Vascular: There are vascular calcifications in the carotid siphons. Skull: Normal. Negative for fracture or focal lesion. Sinuses/Orbits: No acute finding. Other: A nasogastric tube is partially imaged. IMPRESSION: 1. No acute intracranial process. Electronically Signed   By: Romona Curls M.D.   On: 12/30/2022 21:53   DG Abd 1 View  Result Date: 12/30/2022 CLINICAL DATA:  Enteric tube placement. EXAM: ABDOMEN - 1 VIEW COMPARISON:  Abdominal radiograph dated 12/30/2022. FINDINGS: An enteric tube terminates in the stomach. Multiple gas-filled loops of bowel are partially imaged. IMPRESSION: An enteric tube terminates in the stomach. Electronically Signed   By: Romona Curls M.D.   On: 12/30/2022 17:17   DG Abd Portable 1V  Result Date: 12/30/2022 CLINICAL DATA:  NG tube placement EXAM: PORTABLE ABDOMEN - 1 VIEW COMPARISON:  12/30/2022, 10:31 a.m. FINDINGS: Esophagogastric tube is positioned with tip in the stomach, side port at the level of the gastroesophageal junction. Stomach and included small bowel remain gas distended. No obvious free air. IMPRESSION: 1. Esophagogastric tube is positioned with tip in the stomach, side port at the level of the gastroesophageal junction. Consider advancement to ensure subdiaphragmatic positioning. 2. Stomach and included small bowel remain gas distended consistent with ileus or obstruction. Electronically Signed   By: Jearld Lesch M.D.   On: 12/30/2022 15:43   US RENAL  Result Date: 12/30/2022 CLINICAL DATA:  Acute renal insufficiency EXAM: RENAL / URINARY TRACT ULTRASOUND COMPLETE COMPARISON:  Renal ultrasound August 21, 2022 FINDINGS: Right Kidney: Renal  measurements: 11.5 x 6.0 x 7.1 cm = volume: 255 mL. Echogenicity within normal limits. No  mass or hydronephrosis visualized. Left Kidney: A 7.8 x 6.5 x 6.8 cm cyst is seen in the expected location of the left kidney. The left kidney which was identified on the August 21, 2022 study is not visualized on provided images. Bladder: The bladder is empty limiting evaluation. Other: None. IMPRESSION: 1. A 7.8 x 6.5 x 6.8 cm cyst is seen in the expected location of the left kidney. The left kidney which was identified on the August 21, 2022 study is not visualized on provided images. 2. The right kidney is normal. 3. The bladder is empty limiting evaluation. Electronically Signed   By: Dorise Bullion III M.D.   On: 12/30/2022 14:31   DG Abd 1 View  Result Date: 12/30/2022 CLINICAL DATA:  Abdominal pain. EXAM: ABDOMEN - 1 VIEW COMPARISON:  Scout image for CT abdomen/pelvis 06/01/2021 FINDINGS: Extremely marked gaseous dilatation of the stomach noted. This is associated with diffuse small bowel dilatation with dilated gas-filled small bowel loops measuring up to at least 6.3 cm diameter. Air in stool are seen in the colon. Bones are demineralized. Status post right hip replacement. IMPRESSION: 1. Extremely marked gaseous dilatation of the stomach. 2. Diffuse marked gaseous small bowel dilatation with dilatedloops measuring up to 6.3 cm diameter. Imaging features highly concerning for small bowel obstruction. Abdomen/pelvis CT with contrast would likely prove helpful to further evaluate. Electronically Signed   By: Misty Stanley M.D.   On: 12/30/2022 11:05      Assessment & Plan    #Chronic AF On amiodarone  lopressor per NT tube  Eliquis changed to heparin given below  Rebolus with amiodarone He has had previous ablation  [facility] today Discussed with Dr Oval Linsey to use 360 biphasic defibrillator From EP room If no surgery can transition back to eliquis once xrays  Show resolving SBO  Concern for [facility] then having  to stop anticoagulation for surgery discussed this with patient    #Chronic diastolic heart failure NYHA III-IV. Symptoms related to HF and weight. Daily weights. Keep K>4, Mg>2 Lasix held due to high gastric output   #Morbid obesity  #Abdominal pain/nausea CT with high grade small bowel obstruction  NG tube  Patient seems to down play any pain  Dr Donne Hazel general surgery apparently aware no plans for OR Eliquis transitioned to heparin      For questions or updates, please contact [place] Please consult [email or web address] for contact info under        Signed, Jenkins Rouge, MD  01/01/2023, 8:56 AM

## 2023-01-01 NOTE — Progress Notes (Addendum)
eLink Physician-Brief Progress Note Patient Name: Timothy Macias DOB: 10-Apr-1961 MRN: [id number]   Date of Service  01/01/2023  HPI/Events of Note  This is the pt with Afib/RVR up to 130' to 150's at times, you were looking for AM labs,   K+ 3.0   with AKI,  Creatinine just came down to 1.8,  GFR 38    NPO   PIV,   On amio 60    116/83   - Neg 12 liters since admission.  Also having generalized pain for which he usually takes oxycodone however patient NPO due to ileus  eICU Interventions  Potassium 10 meqs IV x 4 doses Ordered morphine 2 mg IV     Intervention Category Intermediate Interventions: Electrolyte abnormality - evaluation and management  Judd Lien 01/01/2023, 4:45 AM

## 2023-01-01 NOTE — Evaluation (Addendum)
Occupational Therapy Evaluation Patient Details Name: Timothy Macias MRN: [id number] DOB: Oct 03, 1961 Today's Date: 01/01/2023   History of Present Illness Pt is a 62 y.o. M who presents 12/30/2022 with SBO, afib/RVR, acute on chronic hypercapnea. Significant PMH: Afib, HTN, previous SBO due to strangulated peri-umbilical hernia s/p ex-lap, OSA on CPAP.   Clinical Impression   Timothy Macias was evaluated s/p the above admission list. At baseline he is a limited ambulator with RW, and uses a WC in the community. His wife is an Therapist, sports and assists with bed mobility, LB ADLs, and bathing. Upon evaluation he was limited by generalized weakness, increased body habitus, unsteady gait, cardiopulmonary endurance, elevated HR and poor activity tolerance. Overall he required mod A +r for bed mobility, min A +2 for simple tranfers with bari AD, and up to total A for LB ADLs. OT to continue to follow acutely. Recommend d/c to AIR for maximal functional progress towards baseline.    HR 143-196 afib, SpO2 97% on 3L O2 via HFNC (cleared by medical team prior to therapy evaluation).     Recommendations for follow up therapy are one component of a multi-disciplinary discharge planning process, led by the attending physician.  Recommendations may be updated based on patient status, additional functional criteria and insurance authorization.   Follow Up Recommendations  Acute inpatient rehab (3hours/day)     Assistance Recommended at Discharge Frequent or constant Supervision/Assistance  Patient can return home with the following A lot of help with walking and/or transfers;A lot of help with bathing/dressing/bathroom;Assistance with cooking/housework;Assistance with feeding;Direct supervision/assist for medications management;Direct supervision/assist for financial management;Assist for transportation;Help with stairs or ramp for entrance    Functional Status Assessment  Patient has had a recent decline in their functional  status and demonstrates the ability to make significant improvements in function in a reasonable and predictable amount of time.  Equipment Recommendations  None recommended by OT    Recommendations for Other Services Rehab consult     Precautions / Restrictions Precautions Precautions: Fall;Other (comment) Precaution Comments: NGT, watch HR Required Braces or Orthoses: Other Brace Other Brace: bilat AFO's Restrictions Weight Bearing Restrictions: No      Mobility Bed Mobility Overal bed mobility: Needs Assistance Bed Mobility: Supine to Sit     Supine to sit: Mod assist, +2 for physical assistance     General bed mobility comments: Assist for BLE management, cues for use of rail, trunk assist to upright    Transfers Overall transfer level: Needs assistance Equipment used: Rollator (4 wheels) Transfers: Sit to/from Stand, Bed to chair/wheelchair/BSC Sit to Stand: Min assist, +2 physical assistance, +2 safety/equipment     Step pivot transfers: Min assist, +2 physical assistance, +2 safety/equipment     General transfer comment: Use of momentum to power up, bariatric Rollator stabilized, increased time/effort for step pivot over to chair. Cues for sequencing/direction. Increased trunk/hip flexion      Balance Overall balance assessment: Needs assistance Sitting-balance support: Feet supported Sitting balance-Leahy Scale: Fair     Standing balance support: Bilateral upper extremity supported Standing balance-Leahy Scale: Poor                             ADL either performed or assessed with clinical judgement   ADL Overall ADL's : Needs assistance/impaired Eating/Feeding: NPO   Grooming: Set up;Sitting   Upper Body Bathing: Minimal assistance;Standing   Lower Body Bathing: Maximal assistance;Sit to/from stand   Upper Body Dressing :  Set up;Sitting   Lower Body Dressing: Maximal assistance Lower Body Dressing Details (indicate cue type and  reason): total A for bilat braces and shoes Toilet Transfer: Minimal assistance;+2 for safety/equipment;+2 for physical assistance;BSC/3in1;Stand-pivot   Toileting- Clothing Manipulation and Hygiene: Maximal assistance;Sit to/from stand       Functional mobility during ADLs: Moderate assistance;+2 for physical assistance;+2 for safety/equipment General ADL Comments: assist due to body habitus, elevated HR< poor activity tolerance, NG and [place] tubing     Vision Baseline Vision/History: 0 No visual deficits Vision Assessment?: No apparent visual deficits     Perception Perception Perception Tested?: No   Praxis Praxis Praxis tested?: Not tested    Pertinent Vitals/Pain Pain Assessment Pain Assessment: Faces Faces Pain Scale: Hurts little more Pain Location: generalized Pain Descriptors / Indicators: Discomfort Pain Intervention(s): Limited activity within patient's tolerance, Monitored during session     Hand Dominance     Extremity/Trunk Assessment Upper Extremity Assessment Upper Extremity Assessment: Generalized weakness   Lower Extremity Assessment Lower Extremity Assessment: Defer to PT evaluation RLE Deficits / Details: Hx foot drop, quad strength grossly 2+/5 RLE Sensation: history of peripheral neuropathy LLE Deficits / Details: Hx foot drop, quad strength grossly 2+/5 LLE Sensation: history of peripheral neuropathy   Cervical / Trunk Assessment Cervical / Trunk Assessment: Other exceptions Cervical / Trunk Exceptions: increased body habitus   Communication Communication Communication: No difficulties   Cognition Arousal/Alertness: Awake/alert Behavior During Therapy: WFL for tasks assessed/performed Overall Cognitive Status: Within Functional Limits for tasks assessed                                       General Comments  HR to 196 with mobility, consistently in 170s, resting 160s upon arrival. Cleared for mobility by RN, PA, and MDs     Exercises     Shoulder Instructions      Home Living Family/patient expects to be discharged to:: Private residence Living Arrangements: Spouse/significant other Available Help at Discharge: Family;Available PRN/intermittently Type of Home: House Home Access: Ramped entrance     Home Layout: One level     Bathroom Shower/Tub: Producer, television/film/video: Handicapped height     Home Equipment: Shower seat - built in;Grab bars - tub/shower;Other (comment);Wheelchair - manual;Cane - single Information systems manager (2 wheels);Rollator (4 wheels)          Prior Functioning/Environment Prior Level of Function : Needs assist             Mobility Comments: walks with bari RW, wife assists with bed mobility, uses WC outside of house ADLs Comments: wife assists with bathing and LB dressing        OT Problem List: Decreased strength;Decreased range of motion;Decreased activity tolerance;Impaired balance (sitting and/or standing);Decreased safety awareness;Decreased knowledge of use of DME or AE;Decreased knowledge of precautions;Cardiopulmonary status limiting activity;Obesity;Pain      OT Treatment/Interventions: Self-care/ADL training;Therapeutic exercise;DME and/or AE instruction;Therapeutic activities;Patient/family education;Balance training    OT Goals(Current goals can be found in the care plan section) Acute Rehab OT Goals Patient Stated Goal: to go home OT Goal Formulation: With patient Time For Goal Achievement: 01/15/23 Potential to Achieve Goals: Good ADL Goals Pt Will Perform Grooming: with min assist;standing Pt Will Perform Upper Body Dressing: with set-up;sitting Pt Will Transfer to Toilet: with min assist;ambulating Pt/caregiver will Perform Home Exercise Program: Increased ROM;Increased strength;Right Upper extremity;With written HEP provided;With Supervision Additional  ADL Goal #1: Pt will complete bed mobiliy with min G a precursor to ADLs   OT Frequency: Min 2X/week    Co-evaluation PT/OT/SLP Co-Evaluation/Treatment: Yes Reason for Co-Treatment: Complexity of the patient's impairments (multi-system involvement);For patient/therapist safety;To address functional/ADL transfers PT goals addressed during session: Mobility/safety with mobility OT goals addressed during session: ADL's and self-care      AM-PAC OT "6 Clicks" Daily Activity     Outcome Measure Help from another person eating meals?: Total Help from another person taking care of personal grooming?: A Little Help from another person toileting, which includes using toliet, bedpan, or urinal?: A Lot Help from another person bathing (including washing, rinsing, drying)?: A Lot Help from another person to put on and taking off regular upper body clothing?: A Little Help from another person to put on and taking off regular lower body clothing?: A Lot 6 Click Score: 13   End of Session Equipment Utilized During Treatment: Rollator (4 wheels);Oxygen Nurse Communication: Mobility status (bari RW)  Activity Tolerance: Patient tolerated treatment well Patient left: in chair;with call bell/phone within reach  OT Visit Diagnosis: Unsteadiness on feet (R26.81);Other abnormalities of gait and mobility (R26.89);Muscle weakness (generalized) (M62.81);Pain                Time: 4196-2229 OT Time Calculation (min): 31 min Charges:  OT General Charges $OT Visit: 1 Visit OT Evaluation $OT Eval Moderate Complexity: 1 Mod    Alexi Dorminey D Causey 01/01/2023, 11:02 AM

## 2023-01-01 NOTE — Progress Notes (Signed)
Rounding Note    Patient Name: Timothy Macias Date of Encounter: 01/01/2023  [facility] Cardiologist: Quentin Ore and [facility]  Subjective   Breathing fine NG tube in Denies a lot of abdominal pain surprisingly   Inpatient Medications    Scheduled Meds:  arformoterol  15 mcg Nebulization BID   Chlorhexidine Gluconate Cloth  6 each Topical Daily   insulin aspart  1-3 Units Subcutaneous Q4H   metoprolol tartrate  50 mg Per NG tube BID   pantoprazole (PROTONIX) IV  40 mg Intravenous Q24H   revefenacin  175 mcg Nebulization Daily   tamsulosin  0.4 mg Oral Daily   Continuous Infusions:  sodium chloride Stopped (01/01/23 0540)   amiodarone 60 mg/hr (01/01/23 0800)   heparin 2,100 Units/hr (01/01/23 0800)   potassium chloride 10 mEq (01/01/23 0810)   PRN Meds: acetaminophen, HYDROmorphone (DILAUDID) injection, menthol-cetylpyridinium, ondansetron (ZOFRAN) IV, phenol   Vital Signs    Vitals:   01/01/23 0700 01/01/23 0735 01/01/23 0800 01/01/23 0843  BP: 127/78  (!) 131/106 117/87  Pulse: (!) 112 (!) 143 (!) 129 (!) 114  Resp: 14 20 20 13   Temp:   98.2 F (36.8 C)   TempSrc:   Oral   SpO2: 95% 96% 97% 99%  Weight:      Height:        Intake/Output Summary (Last 24 hours) at 01/01/2023 0856 Last data filed at 01/01/2023 0810 Gross per 24 hour  Intake 1811.78 ml  Output 5625 ml  Net -3813.22 ml      01/01/2023    5:00 AM 12/31/2022    5:00 AM 12/30/2022    3:45 AM  Last 3 Weights  Weight (lbs) 372 lb 5.7 oz 381 lb 9.9 oz 389 lb 12.8 oz  Weight (kg) 168.9 kg 173.1 kg 176.812 kg      Telemetry    AF w/ RVR, rates 130-160s - Personally Reviewed  ECG    AF w/ RVR. RBBB. LAFB - Personally Reviewed  Physical Exam   Super morbid obesity Bipap NG tube draining Abdomen distended not acute Lungs clear anteriorly Distant heart sounds Plus 1-2 edema  Labs    High Sensitivity Troponin:   Recent Labs  Lab 12/28/22 1400 12/28/22 1620 12/29/22 1556  12/29/22 1713  TROPONINIHS 9 8 7 7      Chemistry Recent Labs  Lab 12/28/22 1335 12/29/22 0552 12/30/22 1512 12/30/22 2105 12/31/22 0258 12/31/22 0259 01/01/23 0210  NA 133*   < > 131*   < > 133* 134* 135  K 3.5   < > 3.0*   < > 4.1 4.2 3.0*  CL 90*   < > 86*  --  88* 90* 86*  CO2 29   < > 30  --  28 22 30   GLUCOSE 112*   < > 114*  --  91 90 84  BUN 47*   < > 60*  --  61* 63* 56*  CREATININE 2.40*   < > 2.46*  --  2.33* 2.29* 1.98*  CALCIUM 8.9   < > 8.3*  --  8.1* 8.1* 8.8*  MG  --   --  2.1  --  2.2  --  2.3  PROT 7.4  --  6.7  --   --   --   --   ALBUMIN 3.7  --  3.2*  --   --  3.1*  --   AST 19  --  22  --   --   --   --  ALT 14  --  13  --   --   --   --   ALKPHOS 73  --  51  --   --   --   --   BILITOT 1.3*  --  1.2  --   --   --   --   GFRNONAA 30*   < > 29*  --  31* 32* 38*  ANIONGAP 14   < > 15  --  17* 22* 19*   < > = values in this interval not displayed.    Lipids No results for input(s): "CHOL", "TRIG", "HDL", "LABVLDL", "LDLCALC", "CHOLHDL" in the last 168 hours.  Hematology Recent Labs  Lab 12/30/22 1512 12/30/22 2105 12/31/22 0258 01/01/23 0210  WBC 3.7*  --  5.6 6.2  RBC 6.77*  --  6.64* 6.76*  HGB 17.9* 18.4* 17.1* 17.7*  HCT 55.8* 54.0* 56.0* 55.4*  MCV 82.4  --  84.3 82.0  MCH 26.4  --  25.8* 26.2  MCHC 32.1  --  30.5 31.9  RDW 20.6*  --  20.3* 19.9*  PLT 212  --  196 209   Thyroid  Recent Labs  Lab 12/30/22 1450  TSH 0.657    BNP Recent Labs  Lab 12/30/22 1306 12/31/22 0259 01/01/23 0210  BNP 173.4* 72.7 82.1    DDimer  Recent Labs  Lab 12/30/22 1512  DDIMER 3.47*     Radiology    DG Abd Portable 1V-Small Bowel Obstruction Protocol-initial, 8 hr delay  Result Date: 12/31/2022 CLINICAL DATA:  Small-bowel obstruction EXAM: PORTABLE ABDOMEN - 1 VIEW COMPARISON:  CT abdomen and pelvis 12/30/2022 FINDINGS: There are numerous air-filled dilated small bowel loops in the central abdomen. These measure up to 7.5 cm, similar to the  prior study. Minimal colonic gas is present. Nasogastric tube tip is at the level of the mid stomach, unchanged. Right hip arthroplasty is present. Visualized lung bases are clear. IMPRESSION: Persistent dilated small bowel loops in the central abdomen compatible with small-bowel obstruction. Electronically Signed   By: Ronney Asters M.D.   On: 12/31/2022 20:10   DG Chest Port 1 View  Result Date: 12/30/2022 CLINICAL DATA:  Shortness of breath EXAM: PORTABLE CHEST 1 VIEW COMPARISON:  Chest x-ray 12/28/2022 FINDINGS: Enteric tube extends below the diaphragm. The heart is enlarged. There is central pulmonary vascular congestion. There are patchy opacities in the left lung base questionable small left pleural effusion. There is no pneumothorax or acute fracture. IMPRESSION: 1. Cardiomegaly with central pulmonary vascular congestion. 2. Patchy opacities in the left lung base could be atelectasis or infiltrate. Electronically Signed   By: Ronney Asters M.D.   On: 12/30/2022 23:31   CT ABDOMEN PELVIS WO CONTRAST  Result Date: 12/30/2022 CLINICAL DATA:  Three days of nausea, abdominal pain, and distention. Concern for bowel obstruction. EXAM: CT ABDOMEN AND PELVIS WITHOUT CONTRAST TECHNIQUE: Multidetector CT imaging of the abdomen and pelvis was performed following the standard protocol without IV contrast. RADIATION DOSE REDUCTION: This exam was performed according to the departmental dose-optimization program which includes automated exposure control, adjustment of the mA and/or kV according to patient size and/or use of iterative reconstruction technique. COMPARISON:  CT abdomen pelvis dated 06/01/2021. FINDINGS: Lower chest: There is moderate bilateral lower lobe atelectasis. Heart is enlarged. Hepatobiliary: No focal liver abnormality is seen. No gallstones, gallbladder wall thickening, or biliary dilatation. Pancreas: Unremarkable. No pancreatic ductal dilatation or surrounding inflammatory changes. Spleen:  Normal in size without focal abnormality.  Adrenals/Urinary Tract: Adrenal glands are unremarkable. Bilateral renal cysts are redemonstrated with the largest measuring 4.9 cm on the right and 9.0 x 6.2 cm on the left. Bilateral nonobstructing calculi are seen, measuring up to 8 x 5 mm on the right and 8 x 5 mm on the left. There is no hydronephrosis on either side. The urinary bladder is unremarkable. Stomach/Bowel: An enteric tube terminates in the stomach. Stomach is within normal limits. Appendix appears normal. Multiple dilated loops of small bowel are seen with a transition 0.2 distal decompressed small bowel in the lower mid abdomen. There is colonic diverticulosis without evidence of diverticulitis. Vascular/Lymphatic: Aortic atherosclerosis. No enlarged abdominal or pelvic lymph nodes. Reproductive: The prostate is obscured by streak artifact. Other: The patient is status post anterior abdominal wall hernia repair. No abdominal wall hernia is identified on today's exam. No free intraperitoneal fluid. Musculoskeletal: The patient is status post a total hip arthroplasty on the right which obscures portions of the pelvis. Degenerative changes are seen in the spine. IMPRESSION: 1. High-grade small-bowel obstruction with a transition point in the lower mid abdomen. No abdominal wall hernia. 2. Moderate bilateral lower lobe atelectasis. 3. Cardiomegaly. Aortic Atherosclerosis (ICD10-I70.0). Electronically Signed   By: Romona Curls M.D.   On: 12/30/2022 22:03   CT HEAD WO CONTRAST ( )  Result Date: 12/30/2022 CLINICAL DATA:  Altered mental status. EXAM: CT HEAD WITHOUT CONTRAST TECHNIQUE: Contiguous axial images were obtained from the base of the skull through the vertex without intravenous contrast. RADIATION DOSE REDUCTION: This exam was performed according to the departmental dose-optimization program which includes automated exposure control, adjustment of the mA and/or kV according to patient size and/or  use of iterative reconstruction technique. COMPARISON:  CT head dated 06/04/2019 FINDINGS: Brain: No evidence of acute infarction, hemorrhage, hydrocephalus, extra-axial collection or mass lesion/mass effect. Periventricular white matter hypoattenuation likely represents chronic small vessel ischemic disease. Vascular: There are vascular calcifications in the carotid siphons. Skull: Normal. Negative for fracture or focal lesion. Sinuses/Orbits: No acute finding. Other: A nasogastric tube is partially imaged. IMPRESSION: 1. No acute intracranial process. Electronically Signed   By: Romona Curls M.D.   On: 12/30/2022 21:53   DG Abd 1 View  Result Date: 12/30/2022 CLINICAL DATA:  Enteric tube placement. EXAM: ABDOMEN - 1 VIEW COMPARISON:  Abdominal radiograph dated 12/30/2022. FINDINGS: An enteric tube terminates in the stomach. Multiple gas-filled loops of bowel are partially imaged. IMPRESSION: An enteric tube terminates in the stomach. Electronically Signed   By: Romona Curls M.D.   On: 12/30/2022 17:17   DG Abd Portable 1V  Result Date: 12/30/2022 CLINICAL DATA:  NG tube placement EXAM: PORTABLE ABDOMEN - 1 VIEW COMPARISON:  12/30/2022, 10:31 a.m. FINDINGS: Esophagogastric tube is positioned with tip in the stomach, side port at the level of the gastroesophageal junction. Stomach and included small bowel remain gas distended. No obvious free air. IMPRESSION: 1. Esophagogastric tube is positioned with tip in the stomach, side port at the level of the gastroesophageal junction. Consider advancement to ensure subdiaphragmatic positioning. 2. Stomach and included small bowel remain gas distended consistent with ileus or obstruction. Electronically Signed   By: Jearld Lesch M.D.   On: 12/30/2022 15:43   US RENAL  Result Date: 12/30/2022 CLINICAL DATA:  Acute renal insufficiency EXAM: RENAL / URINARY TRACT ULTRASOUND COMPLETE COMPARISON:  Renal ultrasound August 21, 2022 FINDINGS: Right Kidney: Renal  measurements: 11.5 x 6.0 x 7.1 cm = volume: 255 mL. Echogenicity within normal limits. No  mass or hydronephrosis visualized. Left Kidney: A 7.8 x 6.5 x 6.8 cm cyst is seen in the expected location of the left kidney. The left kidney which was identified on the August 21, 2022 study is not visualized on provided images. Bladder: The bladder is empty limiting evaluation. Other: None. IMPRESSION: 1. A 7.8 x 6.5 x 6.8 cm cyst is seen in the expected location of the left kidney. The left kidney which was identified on the August 21, 2022 study is not visualized on provided images. 2. The right kidney is normal. 3. The bladder is empty limiting evaluation. Electronically Signed   By: Dorise Bullion III M.D.   On: 12/30/2022 14:31   DG Abd 1 View  Result Date: 12/30/2022 CLINICAL DATA:  Abdominal pain. EXAM: ABDOMEN - 1 VIEW COMPARISON:  Scout image for CT abdomen/pelvis 06/01/2021 FINDINGS: Extremely marked gaseous dilatation of the stomach noted. This is associated with diffuse small bowel dilatation with dilated gas-filled small bowel loops measuring up to at least 6.3 cm diameter. Air in stool are seen in the colon. Bones are demineralized. Status post right hip replacement. IMPRESSION: 1. Extremely marked gaseous dilatation of the stomach. 2. Diffuse marked gaseous small bowel dilatation with dilatedloops measuring up to 6.3 cm diameter. Imaging features highly concerning for small bowel obstruction. Abdomen/pelvis CT with contrast would likely prove helpful to further evaluate. Electronically Signed   By: Misty Stanley M.D.   On: 12/30/2022 11:05      Assessment & Plan    #Chronic AF On amiodarone  lopressor per NT tube  Eliquis changed to heparin given below  Rebolus with amiodarone He has had previous ablation  [facility] today Discussed with Dr Oval Linsey to use 360 biphasic defibrillator From EP room If no surgery can transition back to eliquis once xrays  Show resolving SBO  Concern for [facility] then having  to stop anticoagulation for surgery discussed this with patient    #Chronic diastolic heart failure NYHA III-IV. Symptoms related to HF and weight. Daily weights. Keep K>4, Mg>2 Lasix held due to high gastric output   #Morbid obesity  #Abdominal pain/nausea CT with high grade small bowel obstruction  NG tube  Patient seems to down play any pain  Dr Donne Hazel general surgery apparently aware no plans for OR Eliquis transitioned to heparin      For questions or updates, please contact [place] Please consult [email or web address] for contact info under        Signed, Jenkins Rouge, MD  01/01/2023, 8:56 AM

## 2023-01-01 NOTE — CV Procedure (Signed)
Electrical Cardioversion Procedure Note Timothy Macias [id number] 08-19-1961  Procedure: Electrical Cardioversion Indications:  Atrial Fibrillation  Procedure Details Consent: Risks of procedure as well as the alternatives and risks of each were explained to the (patient/caregiver).  Consent for procedure obtained. Time Out: Verified patient identification, verified procedure, site/side was marked, verified correct patient position, special equipment/implants available, medications/allergies/relevent history reviewed, required imaging and test results available.  Performed  Patient placed on cardiac monitor, pulse oximetry, supplemental oxygen as necessary.  Sedation given:  propofol Pacer pads placed anterior chest.  Cardioverted 1 time(s).  Cardioverted at [place].  Evaluation Findings: Post procedure EKG shows:  sinus tachycardia with frequent PVCs Complications: None Patient did tolerate procedure well.   Skeet Latch, MD 01/01/2023, 1:48 PM

## 2023-01-01 NOTE — Progress Notes (Signed)
Subjective: Patient with no complaints this morning.  Denies abdominal pain.  Denies flatus or BM.  Hasn't gotten out of bed since admission. Plan for cardioversion today at 1230pm.   Objective: Vital signs in last 24 hours: Temp:  [97.7 F (36.5 C)-98.7 F (37.1 C)] 98.6 F (37 C) (01/09 0401) Pulse Rate:  [87-148] 143 (01/09 0735) Resp:  [8-25] 20 (01/09 0735) BP: (99-130)/(58-99) 127/78 (01/09 0700) SpO2:  [92 %-100 %] 96 % (01/09 0735) FiO2 (%):  [50 %-60 %] 50 % (01/09 0000) Weight:  [168.9 kg] 168.9 kg (01/09 0500) Last BM Date : 12/28/22  Intake/Output from previous day: 01/08 0701 - 01/09 0700 In: 1655.2 [I.V.:1527.5; IV Piggyback:127.7] Out: 5625 [Urine:3775; Emesis/NG output:1850] Intake/Output this shift: No intake/output data recorded.  PE: Gen: NAD Heart: tachy in 150s Lungs: normal respiratory effort Abd: morbidly obese, mildly tender in RUQ, NGT in place with bilious output, 1850cc in NGT yesterday  Lab Results:  Recent Labs    12/31/22 0258 01/01/23 0210  WBC 5.6 6.2  HGB 17.1* 17.7*  HCT 56.0* 55.4*  PLT 196 209   BMET Recent Labs    12/31/22 0259 01/01/23 0210  NA 134* 135  K 4.2 3.0*  CL 90* 86*  CO2 22 30  GLUCOSE 90 84  BUN 63* 56*  CREATININE 2.29* 1.98*  CALCIUM 8.1* 8.8*   PT/INR Recent Labs    12/31/22 1215  LABPROT 22.2*  INR 2.0*   CMP     Component Value Date/Time   NA 135 01/01/2023 0210   K 3.0 (L) 01/01/2023 0210   CL 86 (L) 01/01/2023 0210   CO2 30 01/01/2023 0210   GLUCOSE 84 01/01/2023 0210   BUN 56 (H) 01/01/2023 0210   CREATININE 1.98 (H) 01/01/2023 0210   CALCIUM 8.8 (L) 01/01/2023 0210   PROT 6.7 12/30/2022 1512   ALBUMIN 3.1 (L) 12/31/2022 0259   AST 22 12/30/2022 1512   ALT 13 12/30/2022 1512   ALKPHOS 51 12/30/2022 1512   BILITOT 1.2 12/30/2022 1512   GFRNONAA 38 (L) 01/01/2023 0210   Lipase  No results found for: "LIPASE"     Studies/Results: DG Abd Portable 1V-Small Bowel  Obstruction Protocol-initial, 8 hr delay  Result Date: 12/31/2022 CLINICAL DATA:  Small-bowel obstruction EXAM: PORTABLE ABDOMEN - 1 VIEW COMPARISON:  CT abdomen and pelvis 12/30/2022 FINDINGS: There are numerous air-filled dilated small bowel loops in the central abdomen. These measure up to 7.5 cm, similar to the prior study. Minimal colonic gas is present. Nasogastric tube tip is at the level of the mid stomach, unchanged. Right hip arthroplasty is present. Visualized lung bases are clear. IMPRESSION: Persistent dilated small bowel loops in the central abdomen compatible with small-bowel obstruction. Electronically Signed   By: Ronney Asters M.D.   On: 12/31/2022 20:10   DG Chest Port 1 View  Result Date: 12/30/2022 CLINICAL DATA:  Shortness of breath EXAM: PORTABLE CHEST 1 VIEW COMPARISON:  Chest x-ray 12/28/2022 FINDINGS: Enteric tube extends below the diaphragm. The heart is enlarged. There is central pulmonary vascular congestion. There are patchy opacities in the left lung base questionable small left pleural effusion. There is no pneumothorax or acute fracture. IMPRESSION: 1. Cardiomegaly with central pulmonary vascular congestion. 2. Patchy opacities in the left lung base could be atelectasis or infiltrate. Electronically Signed   By: Ronney Asters M.D.   On: 12/30/2022 23:31   CT ABDOMEN PELVIS WO CONTRAST  Result Date: 12/30/2022 CLINICAL DATA:  Three days of nausea, abdominal pain, and distention. Concern for bowel obstruction. EXAM: CT ABDOMEN AND PELVIS WITHOUT CONTRAST TECHNIQUE: Multidetector CT imaging of the abdomen and pelvis was performed following the standard protocol without IV contrast. RADIATION DOSE REDUCTION: This exam was performed according to the departmental dose-optimization program which includes automated exposure control, adjustment of the mA and/or kV according to patient size and/or use of iterative reconstruction technique. COMPARISON:  CT abdomen pelvis dated  06/01/2021. FINDINGS: Lower chest: There is moderate bilateral lower lobe atelectasis. Heart is enlarged. Hepatobiliary: No focal liver abnormality is seen. No gallstones, gallbladder wall thickening, or biliary dilatation. Pancreas: Unremarkable. No pancreatic ductal dilatation or surrounding inflammatory changes. Spleen: Normal in size without focal abnormality. Adrenals/Urinary Tract: Adrenal glands are unremarkable. Bilateral renal cysts are redemonstrated with the largest measuring 4.9 cm on the right and 9.0 x 6.2 cm on the left. Bilateral nonobstructing calculi are seen, measuring up to 8 x 5 mm on the right and 8 x 5 mm on the left. There is no hydronephrosis on either side. The urinary bladder is unremarkable. Stomach/Bowel: An enteric tube terminates in the stomach. Stomach is within normal limits. Appendix appears normal. Multiple dilated loops of small bowel are seen with a transition 0.2 distal decompressed small bowel in the lower mid abdomen. There is colonic diverticulosis without evidence of diverticulitis. Vascular/Lymphatic: Aortic atherosclerosis. No enlarged abdominal or pelvic lymph nodes. Reproductive: The prostate is obscured by streak artifact. Other: The patient is status post anterior abdominal wall hernia repair. No abdominal wall hernia is identified on today's exam. No free intraperitoneal fluid. Musculoskeletal: The patient is status post a total hip arthroplasty on the right which obscures portions of the pelvis. Degenerative changes are seen in the spine. IMPRESSION: 1. High-grade small-bowel obstruction with a transition point in the lower mid abdomen. No abdominal wall hernia. 2. Moderate bilateral lower lobe atelectasis. 3. Cardiomegaly. Aortic Atherosclerosis (ICD10-I70.0). Electronically Signed   By: Zerita Boers M.D.   On: 12/30/2022 22:03   CT HEAD WO CONTRAST (5MM)  Result Date: 12/30/2022 CLINICAL DATA:  Altered mental status. EXAM: CT HEAD WITHOUT CONTRAST TECHNIQUE:  Contiguous axial images were obtained from the base of the skull through the vertex without intravenous contrast. RADIATION DOSE REDUCTION: This exam was performed according to the departmental dose-optimization program which includes automated exposure control, adjustment of the mA and/or kV according to patient size and/or use of iterative reconstruction technique. COMPARISON:  CT head dated 06/04/2019 FINDINGS: Brain: No evidence of acute infarction, hemorrhage, hydrocephalus, extra-axial collection or mass lesion/mass effect. Periventricular white matter hypoattenuation likely represents chronic small vessel ischemic disease. Vascular: There are vascular calcifications in the carotid siphons. Skull: Normal. Negative for fracture or focal lesion. Sinuses/Orbits: No acute finding. Other: A nasogastric tube is partially imaged. IMPRESSION: 1. No acute intracranial process. Electronically Signed   By: Zerita Boers M.D.   On: 12/30/2022 21:53   DG Abd 1 View  Result Date: 12/30/2022 CLINICAL DATA:  Enteric tube placement. EXAM: ABDOMEN - 1 VIEW COMPARISON:  Abdominal radiograph dated 12/30/2022. FINDINGS: An enteric tube terminates in the stomach. Multiple gas-filled loops of bowel are partially imaged. IMPRESSION: An enteric tube terminates in the stomach. Electronically Signed   By: Zerita Boers M.D.   On: 12/30/2022 17:17   DG Abd Portable 1V  Result Date: 12/30/2022 CLINICAL DATA:  NG tube placement EXAM: PORTABLE ABDOMEN - 1 VIEW COMPARISON:  12/30/2022, 10:31 a.m. FINDINGS: Esophagogastric tube is positioned with tip in the stomach,  side port at the level of the gastroesophageal junction. Stomach and included small bowel remain gas distended. No obvious free air. IMPRESSION: 1. Esophagogastric tube is positioned with tip in the stomach, side port at the level of the gastroesophageal junction. Consider advancement to ensure subdiaphragmatic positioning. 2. Stomach and included small bowel remain gas  distended consistent with ileus or obstruction. Electronically Signed   By: Jearld Lesch M.D.   On: 12/30/2022 15:43   US RENAL  Result Date: 12/30/2022 CLINICAL DATA:  Acute renal insufficiency EXAM: RENAL / URINARY TRACT ULTRASOUND COMPLETE COMPARISON:  Renal ultrasound August 21, 2022 FINDINGS: Right Kidney: Renal measurements: 11.5 x 6.0 x 7.1 cm = volume: 255 mL. Echogenicity within normal limits. No mass or hydronephrosis visualized. Left Kidney: A 7.8 x 6.5 x 6.8 cm cyst is seen in the expected location of the left kidney. The left kidney which was identified on the August 21, 2022 study is not visualized on provided images. Bladder: The bladder is empty limiting evaluation. Other: None. IMPRESSION: 1. A 7.8 x 6.5 x 6.8 cm cyst is seen in the expected location of the left kidney. The left kidney which was identified on the August 21, 2022 study is not visualized on provided images. 2. The right kidney is normal. 3. The bladder is empty limiting evaluation. Electronically Signed   By: Gerome Sam III M.D.   On: 12/30/2022 14:31   ECHOCARDIOGRAM COMPLETE  Result Date: 12/30/2022    ECHOCARDIOGRAM REPORT   Patient Name:   Timothy Macias Date of Exam: 12/30/2022 Medical Rec #:  [id number]         Height:       67.5 in Accession #:    [id number]        Weight:       389.8 lb Date of Birth:  [birth year]          BSA:          2.700 m Patient Age:    61 years          BP:           124/95 mmHg Patient Gender: M                 HR:           175 bpm. Exam Location:  Inpatient Procedure: 2D Echo Indications:    CHF  History:        Patient has no prior history of Echocardiogram examinations.  Sonographer:    Cathie Hoops Referring Phys: [phone] SEYED A SHAHMEHDI  Sonographer Comments: Technically difficult study due to poor echo windows and patient is obese. Image acquisition challenging due to patient body habitus and Image acquisition challenging due to respiratory motion. IMPRESSIONS  1. Poor acoustic  windows. Non-diagnsotic study. Consider repeat Echo with contrast at normal heart rate. Comparison(s): No prior Echocardiogram. FINDINGS  Left Ventricle: Poor acoustic windows. Non-diagnsotic study. Consider repeat Echo with contrast at normal heart rate.  LEFT VENTRICLE PLAX 2D LVIDd:         4.90 cm   Diastology LVIDs:         3.60 cm   LV e' medial:    6.02 cm/s LV PW:         1.20 cm   LV E/e' medial:  15.2 LV IVS:        1.20 cm   LV e' lateral:   10.13 cm/s LVOT diam:     2.50 cm   LV  E/e' lateral: 9.0 LVOT Area:     4.91 cm  LEFT ATRIUM           Index LA diam:      4.00 cm 1.48 cm/m LA Vol (A4C): 75.3 ml 27.89 ml/m                        PULMONIC VALVE AORTA                 PV Vmax:       1.25 m/s Ao Root diam: 3.90 cm PV Peak grad:  6.2 mmHg Ao Asc diam:  3.60 cm  MITRAL VALVE MV Area (PHT): 5.16 cm    SHUNTS MV Decel Time: 147 msec    Systemic Diam: 2.50 cm MR Peak grad: 10.0 mmHg MR Vmax:      158.00 cm/s MV E velocity: 91.57 cm/s MV A velocity: 34.10 cm/s MV E/A ratio:  2.69 Vishnu Priya Mallipeddi Electronically signed by Lorelee Cover Mallipeddi Signature Date/Time: 12/30/2022/11:48:24 AM    Final    DG Abd 1 View  Result Date: 12/30/2022 CLINICAL DATA:  Abdominal pain. EXAM: ABDOMEN - 1 VIEW COMPARISON:  Scout image for CT abdomen/pelvis 06/01/2021 FINDINGS: Extremely marked gaseous dilatation of the stomach noted. This is associated with diffuse small bowel dilatation with dilated gas-filled small bowel loops measuring up to at least 6.3 cm diameter. Air in stool are seen in the colon. Bones are demineralized. Status post right hip replacement. IMPRESSION: 1. Extremely marked gaseous dilatation of the stomach. 2. Diffuse marked gaseous small bowel dilatation with dilatedloops measuring up to 6.3 cm diameter. Imaging features highly concerning for small bowel obstruction. Abdomen/pelvis CT with contrast would likely prove helpful to further evaluate. Electronically Signed   By: Misty Stanley M.D.    On: 12/30/2022 11:05    Anti-infectives: Anti-infectives (From admission, onward)    None        Assessment/Plan SBO -cont NGT. -repeat films this am with no real contrast noted.  Sb still dilated up to 7.5cm. -no bowel function so far -plan for one more day of conservative management.  If no significant improvement by tomorrow, will likely need surgical intervention. -discussed with patient and he is aware and agreeable as he is hungry. -PT/OT to see and help mobilize and at least get out of bed -IS for pulm toilet -repeat film in am   FEN - NPO/NGT/IVFs.  No oral meds as he will not absorb this due to obstruction VTE - heparin gtt ID - none currently needed  Afib - cardioversion today.  SBO pathology/stress may be contributing along with massive fluid shifts from NGT. CHF HTN OSA NASH Acute on CKD  I reviewed Consultant cardiology notes, hospitalist notes, last 24 h vitals and pain scores, last 48 h intake and output, last 24 h labs and trends, and last 24 h imaging results.   LOS: 4 days    Henreitta Cea , [facility] 01/01/2023, 7:41 AM Please see Amion for pager number during day hours 7:00am-4:30pm or 7:00am -11:30am on weekends

## 2023-01-01 NOTE — Interval H&P Note (Signed)
History and Physical Interval Note:  01/01/2023 12:45 PM  Timothy Macias  has presented today for surgery, with the diagnosis of atrial fibrillation.  The various methods of treatment have been discussed with the patient and family. After consideration of risks, benefits and other options for treatment, the patient has consented to  Procedure(s): CARDIOVERSION (N/A) as a surgical intervention.  The patient's history has been reviewed, patient examined, no change in status, stable for surgery.  I have reviewed the patient's chart and labs.  Questions were answered to the patient's satisfaction.     Skeet Latch, MD

## 2023-01-01 NOTE — Progress Notes (Signed)
? ?  Inpatient Rehab Admissions Coordinator : ? ?Per therapy recommendations, patient was screened for CIR candidacy by Baraa Tubbs RN MSN.  At this time patient appears to be a potential candidate for CIR. I will place a rehab consult per protocol for full assessment. Please call me with any questions. ? ?Kynsie Falkner RN MSN ?Admissions Coordinator ?[phone] ?  ?

## 2023-01-01 NOTE — Evaluation (Signed)
Physical Therapy Evaluation Patient Details Name: Timothy Macias MRN: [id number] DOB: 1961/10/22 Today's Date: 01/01/2023  History of Present Illness  Pt is a 62 y.o. M who presents 12/30/2022 with SBO, afib/RVR, acute on chronic hypercapnea. Significant PMH: Afib, HTN, previous SBO due to strangulated peri-umbilical hernia s/p ex-lap, OSA on CPAP.  Clinical Impression  PTA, pt lives with his spouse (who is a Therapist, sports), uses a RW for household ambulation and requires assist for LB ADL's. Pt wears bilateral AFO's at baseline. Pt presents with generalized weakness, sensation impairments (chronic), decreased cardiopulmonary endurance and decreased activity tolerance. Pt requiring two person min-mod assist for bed mobility and transferring to chair using bariatric Rollator. HR 143-196 afib, SpO2 97% on 3L O2 via HFNC (cleared by medical team prior to therapy evaluation). Pt would benefit from AIR to address deficits and maximize functional mobility. Suspect steady progress given age, family support and motivation.     Recommendations for follow up therapy are one component of a multi-disciplinary discharge planning process, led by the attending physician.  Recommendations may be updated based on patient status, additional functional criteria and insurance authorization.  Follow Up Recommendations Acute inpatient rehab (3hours/day)      Assistance Recommended at Discharge PRN  Patient can return home with the following  A lot of help with walking and/or transfers;A lot of help with bathing/dressing/bathroom;Assistance with cooking/housework;Help with stairs or ramp for entrance;Assist for transportation    Equipment Recommendations None recommended by PT (pt well equipped)  Recommendations for Other Services  Rehab consult    Functional Status Assessment Patient has had a recent decline in their functional status and demonstrates the ability to make significant improvements in function in a reasonable  and predictable amount of time.     Precautions / Restrictions Precautions Precautions: Fall;Other (comment) Precaution Comments: NGT, watch HR Required Braces or Orthoses: Other Brace Other Brace: bilat AFO's Restrictions Weight Bearing Restrictions: No      Mobility  Bed Mobility Overal bed mobility: Needs Assistance Bed Mobility: Supine to Sit     Supine to sit: Mod assist, +2 for physical assistance     General bed mobility comments: Assist for BLE management, cues for use of rail, trunk assist to upright    Transfers Overall transfer level: Needs assistance Equipment used: Rollator (4 wheels) Transfers: Sit to/from Stand, Bed to chair/wheelchair/BSC Sit to Stand: Min assist, +2 physical assistance, +2 safety/equipment   Step pivot transfers: Min assist, +2 physical assistance, +2 safety/equipment       General transfer comment: Use of momentum to power up, bariatric Rollator stabilized, increased time/effort for step pivot over to chair. Cues for sequencing/direction. Increased trunk/hip flexion    Ambulation/Gait                  Stairs            Wheelchair Mobility    Modified Rankin (Stroke Patients Only)       Balance Overall balance assessment: Needs assistance Sitting-balance support: Feet supported Sitting balance-Leahy Scale: Fair     Standing balance support: Bilateral upper extremity supported Standing balance-Leahy Scale: Poor                               Pertinent Vitals/Pain Pain Assessment Pain Assessment: Faces Faces Pain Scale: Hurts little more Pain Location: generalized Pain Descriptors / Indicators: Discomfort Pain Intervention(s): Monitored during session    Home Living Family/patient expects to be  discharged to:: Private residence Living Arrangements: Spouse/significant other Available Help at Discharge: Family;Available PRN/intermittently Type of Home: House Home Access: Ramped entrance        Home Layout: One level Home Equipment: Shower seat - built in;Grab bars - tub/shower;Other (comment);Wheelchair - manual;Cane - single Information systems manager (2 wheels);Rollator (4 wheels) (steady and hoyer)      Prior Function Prior Level of Function : Needs assist             Mobility Comments: walks with bari RW, wife assists with bed mobility       Hand Dominance        Extremity/Trunk Assessment   Upper Extremity Assessment Upper Extremity Assessment: Defer to OT evaluation    Lower Extremity Assessment Lower Extremity Assessment: RLE deficits/detail;LLE deficits/detail RLE Deficits / Details: Hx foot drop, quad strength grossly 2+/5 RLE Sensation: history of peripheral neuropathy LLE Deficits / Details: Hx foot drop, quad strength grossly 2+/5 LLE Sensation: history of peripheral neuropathy    Cervical / Trunk Assessment Cervical / Trunk Assessment: Other exceptions Cervical / Trunk Exceptions: increased body habitus  Communication   Communication: No difficulties  Cognition Arousal/Alertness: Awake/alert Behavior During Therapy: WFL for tasks assessed/performed Overall Cognitive Status: Within Functional Limits for tasks assessed                                          General Comments      Exercises     Assessment/Plan    PT Assessment Patient needs continued PT services  PT Problem List Decreased strength;Decreased activity tolerance;Decreased balance;Decreased mobility;Cardiopulmonary status limiting activity       PT Treatment Interventions DME instruction;Gait training;Functional mobility training;Therapeutic activities;Therapeutic exercise;Balance training;Patient/family education    PT Goals (Current goals can be found in the Care Plan section)  Acute Rehab PT Goals Patient Stated Goal: go home PT Goal Formulation: With patient Time For Goal Achievement: 01/15/23 Potential to Achieve Goals: Good     Frequency Min 3X/week     Co-evaluation PT/OT/SLP Co-Evaluation/Treatment: Yes Reason for Co-Treatment: For patient/therapist safety;To address functional/ADL transfers;Complexity of the patient's impairments (multi-system involvement) PT goals addressed during session: Mobility/safety with mobility         AM-PAC PT "6 Clicks" Mobility  Outcome Measure Help needed turning from your back to your side while in a flat bed without using bedrails?: A Lot Help needed moving from lying on your back to sitting on the side of a flat bed without using bedrails?: A Lot Help needed moving to and from a bed to a chair (including a wheelchair)?: A Little Help needed standing up from a chair using your arms (e.g., wheelchair or bedside chair)?: A Little Help needed to walk in hospital room?: Total Help needed climbing 3-5 steps with a railing? : Total 6 Click Score: 12    End of Session Equipment Utilized During Treatment: Oxygen Activity Tolerance: Patient tolerated treatment well Patient left: in chair;with call bell/phone within reach Nurse Communication: Mobility status PT Visit Diagnosis: Unsteadiness on feet (R26.81);Difficulty in walking, not elsewhere classified (R26.2)    Time: 5284-1324 PT Time Calculation (min) (ACUTE ONLY): 32 min   Charges:   PT Evaluation $PT Eval Moderate Complexity: 1 Mod          Lillia Pauls, PT, DPT Acute Rehabilitation Services Office [phone]   Timothy Macias 01/01/2023, 9:51 AM

## 2023-01-01 NOTE — Progress Notes (Signed)
ANTICOAGULATION CONSULT NOTE   Pharmacy Consult for heparin Indication: atrial fibrillation  Allergies  Allergen Reactions   Albuterol Swelling   Amiodarone Rash   Penicillins Hives    Patient Measurements: Height: 5\' 8"  (172.7 cm) Weight: (!) 168.9 kg (372 lb 5.7 oz) IBW/kg (Calculated) : 68.4 Heparin Dosing Weight: 113kg  Vital Signs: Temp: 98.2 F (36.8 C) (01/09 0800) Temp Source: Oral (01/09 0800) BP: 131/106 (01/09 0800) Pulse Rate: 129 (01/09 0800)  Labs: Recent Labs    12/29/22 1556 12/29/22 1713 12/30/22 0145 12/30/22 1512 12/30/22 2105 12/31/22 0258 12/31/22 0259 12/31/22 1152 12/31/22 1215 01/01/23 0210  HGB  --   --    < > 17.9* 18.4* 17.1*  --   --   --  17.7*  HCT  --   --    < > 55.8* 54.0* 56.0*  --   --   --  55.4*  PLT  --   --    < > 212  --  196  --   --   --  209  APTT  --   --   --   --   --  45*  --  72*  --  74*  LABPROT  --   --   --   --   --   --   --   --  22.2*  --   INR  --   --   --   --   --   --   --   --  2.0*  --   HEPARINUNFRC  --   --   --   --   --   --  >1.10*  --   --  >1.10*  CREATININE  --   --    < > 2.46*  --  2.33* 2.29*  --   --  1.98*  TROPONINIHS 7 7  --   --   --   --   --   --   --   --    < > = values in this interval not displayed.     Estimated Creatinine Clearance: 60.2 mL/min (A) (by C-G formula based on SCr of 1.98 mg/dL (H)).   Medical History: Past Medical History:  Diagnosis Date   Atrial fibrillation (HCC)    CHF (congestive heart failure) (HCC)    Fatty liver    Hypertension    Sleep apnea    wears CPAP    Medications:  Medications Prior to Admission  Medication Sig Dispense Refill Last Dose   acetaminophen (TYLENOL) 500 MG tablet Take 1,000 mg by mouth every 8 (eight) hours as needed for mild pain.   Past Month   amiodarone (PACERONE) 200 MG tablet Take 400 mg by mouth daily.   12/28/2022 at 1400   apixaban (ELIQUIS) 5 MG TABS tablet Take 5 mg by mouth daily.   12/28/2022 at 0800    cyclobenzaprine (FLEXERIL) 10 MG tablet Take 10 mg by mouth 3 (three) times daily as needed for muscle spasms.   Past Week   diphenhydramine-acetaminophen (TYLENOL PM) 25-500 MG TABS tablet Take 2 tablets by mouth at bedtime as needed.   12/28/2022   DULoxetine (CYMBALTA) 60 MG capsule Take 60 mg by mouth 2 (two) times daily.   12/28/2022   ferrous sulfate 325 (65 FE) MG EC tablet Take 325 mg by mouth daily with breakfast.   12/28/2022   folic acid (FOLVITE) 1 MG tablet Take 1 mg by  mouth daily.   12/28/2022   furosemide (LASIX) 80 MG tablet Take 80 mg by mouth daily.   12/28/2022   LORazepam (ATIVAN) 0.5 MG tablet Take 0.5 mg by mouth every 8 (eight) hours as needed for anxiety.   12/28/2022   losartan (COZAAR) 25 MG tablet Take 12.5 mg by mouth daily.   12/28/2022   Magnesium 400 MG TABS Take 400 mg by mouth daily.   12/28/2022   melatonin 5 MG TABS Take 5-10 mg by mouth at bedtime as needed (sleep).   Past Month   metoprolol succinate (TOPROL-XL) 25 MG 24 hr tablet Take 75 mg by mouth daily.   12/28/2022 at 0800   Multiple Vitamins-Minerals (YOUR LIFE MULTI ADULT GUMMIES) CHEW Chew by mouth.   12/27/2022   oxycodone (ROXICODONE) 30 MG immediate release tablet Take 60 mg by mouth 3 (three) times daily as needed (pain).   12/28/2022   pantoprazole (PROTONIX) 40 MG tablet Take 1 tablet by mouth 2 (two) times daily.   12/28/2022   potassium chloride SA (K-DUR) 20 MEQ tablet Take 40 mEq by mouth daily.   12/28/2022   pregabalin (LYRICA) 100 MG capsule Take 100 mg by mouth at bedtime.   12/27/2022   promethazine (PHENERGAN) 25 MG tablet Take 25 mg by mouth every 6 (six) hours as needed for nausea or vomiting.   12/28/2022   SUMAtriptan (IMITREX) 100 MG tablet Take 100 mg by mouth every 2 (two) hours as needed for migraine.   unk   tamsulosin (FLOMAX) 0.4 MG CAPS capsule Take 0.8 mg by mouth daily.   12/28/2022   testosterone cypionate (DEPOTESTOSTERONE CYPIONATE) 200 MG/ML injection Inject 200 mg into the muscle once a week.    12/16/2022   Scheduled:   arformoterol  15 mcg Nebulization BID   Chlorhexidine Gluconate Cloth  6 each Topical Daily   furosemide  80 mg Intravenous Daily   insulin aspart  1-3 Units Subcutaneous Q4H   pantoprazole (PROTONIX) IV  40 mg Intravenous Q24H   revefenacin  175 mcg Nebulization Daily   tamsulosin  0.4 mg Oral Daily   Infusions:   sodium chloride Stopped (01/01/23 0540)   amiodarone 60 mg/hr (01/01/23 0800)   heparin 2,100 Units/hr (01/01/23 0800)   potassium chloride 10 mEq (01/01/23 0810)    Assessment: Pt with a hx of afib on apixaba who was admitted for possible bowel obstruction. Plan to bridge with heparin for possible surgery. Last apixaban dose 12/30/22 @ 8AM. Expecting heparin level to be elevated due to apixaban. We will use PTT to monitor until correlation.  Heparin level remains >1.1, aPTT therapeutic, CBC stable.  Goal of Therapy:  Heparin level 0.3-0.7 units/ml aPTT 66-102 seconds Monitor platelets by anticoagulation protocol: Yes   Plan:  Continue heparin 2100 units/h Daily aPTT, heparin level, CBC   Arrie Senate, PharmD, [place], [facility] Clinical Pharmacist [phone] Please check AMION for all [facility] Pharmacy numbers 01/01/2023

## 2023-01-01 NOTE — Anesthesia Preprocedure Evaluation (Signed)
Anesthesia Evaluation  Patient identified by MRN, date of birth, ID band Patient awake    Reviewed: Allergy & Precautions, H&P , NPO status , Patient's Chart, lab work & pertinent test results  Airway Mallampati: II   Neck ROM: full    Dental   Pulmonary sleep apnea , former smoker   breath sounds clear to auscultation       Cardiovascular hypertension, +CHF  + dysrhythmias Atrial Fibrillation  Rhythm:irregular Rate:Normal     Neuro/Psych    GI/Hepatic ,GERD  ,,  Endo/Other    Morbid obesity  Renal/GU Renal InsufficiencyRenal disease     Musculoskeletal   Abdominal   Peds  Hematology   Anesthesia Other Findings   Reproductive/Obstetrics                             Anesthesia Physical Anesthesia Plan  ASA: 3  Anesthesia Plan: General   Post-op Pain Management:    Induction: Intravenous  PONV Risk Score and Plan: 2 and Propofol infusion and Treatment may vary due to age or medical condition  Airway Management Planned: Mask  Additional Equipment:   Intra-op Plan:   Post-operative Plan:   Informed Consent: I have reviewed the patients History and Physical, chart, labs and discussed the procedure including the risks, benefits and alternatives for the proposed anesthesia with the patient or authorized representative who has indicated his/her understanding and acceptance.     Dental advisory given  Plan Discussed with: CRNA, Anesthesiologist and Surgeon  Anesthesia Plan Comments:        Anesthesia Quick Evaluation

## 2023-01-01 NOTE — TOC Initial Note (Signed)
Transition of Care [facility]) - Initial/Assessment Note    Patient Details  Name: Timothy Macias MRN: [id number] Date of Birth: 1961-02-25  Transition of Care [facility]) CM/SW Contact:    Erenest Rasher, RN Phone Number: [phone] 01/01/2023, 9:35 AM  Clinical Narrative:                 TOC CM spoke to pt and wife at bedside. Wife states pt had [facility] in the past. Has needed DME in the home. Will continue to follow for dc needs.   Expected Discharge Plan: [place] Barriers to Discharge: Continued Medical Work up   Patient Goals and CMS Choice Patient states their goals for this hospitalization and ongoing recovery are:: wants him to get better and recover CMS Medicare.gov Compare Post Acute Care list provided to:: Patient Represenative (must comment) (Wife) Choice offered to / list presented to : Spouse      Expected Discharge Plan and Services   Discharge Planning Services: CM Consult Post Acute Care Choice: [place] arrangements for the past 2 months: Single Family Home                                      Prior Living Arrangements/Services Living arrangements for the past 2 months: Single Family Home Lives with:: Spouse Patient language and need for interpreter reviewed:: Yes Do you feel safe going back to the place where you live?: Yes      Need for Family Participation in Patient Care: Yes (Comment) Care giver support system in place?: Yes (comment) Current home services: DME (bedside commode, cane, RW, shower chair, CPAP, hoyer lift, easy stand, wheelchair) Criminal Activity/Legal Involvement Pertinent to Current Situation/Hospitalization: No - Comment as needed  Activities of Daily Living      Permission Sought/Granted Permission sought to share information with : Case Manager, Family Supports, PCP Permission granted to share information with : Yes, Verbal Permission Granted  Share Information with NAME: Rea Reser     Permission granted to share info w Relationship: wife  Permission granted to share info w Contact Information: [phone]  Emotional Assessment Appearance:: Appears stated age Attitude/Demeanor/Rapport: Engaged Affect (typically observed): Accepting Orientation: : Oriented to Self, Oriented to Place, Oriented to  Time, Oriented to Situation   Psych Involvement: No (comment)  Admission diagnosis:  Paroxysmal atrial fibrillation ([place]) [I48.0] Atrial fibrillation with rapid ventricular response (HCC) [I48.91] Atrial fibrillation with RVR ([place]) [I48.91] Acute on chronic congestive heart failure, unspecified heart failure type [facility]) [I50.9] Patient Active Problem List   Diagnosis Date Noted   AKI (acute kidney injury) ([place]) 12/28/2022   Longstanding persistent atrial fibrillation ([place]) 08/19/2019   Paroxysmal atrial fibrillation ([place]) 05/25/2016   Non-rheumatic mitral regurgitation 05/25/2016   Essential hypertension 04/30/2016   Gastroesophageal reflux disease without esophagitis 04/30/2016   OSA on CPAP 04/30/2016   Tobacco abuse 04/30/2016   Obesity, morbid, BMI 50 or higher ([place]) 09/29/2015   Atrial fibrillation with RVR ([place]) 09/26/2015   PCP:  Caryl Bis, MD Pharmacy:   [facility] [address] [place] [address] Phone: [phone] Fax: [phone]     Social Determinants of Health (SDOH) Social History: SDOH Screenings   Tobacco Use: Medium Risk (12/30/2022)   SDOH Interventions:     Readmission Risk Interventions     No data to display

## 2023-01-01 NOTE — Consult Note (Signed)
This is a progress note    NAME:  Timothy Macias, MRN:  [id number], DOB:  07/09/1961, LOS: 4 ADMISSION DATE:  12/28/2022, CONSULTATION DATE:  1/7 REFERRING MD:  Timothy Macias, CHIEF COMPLAINT:  encephalopathy   History of Present Illness:  Timothy Macias is a 62 y/o gentleman with a history of Afib, HTN, previous SBO due to strangulated peri-umbilical hernia s/p ex-lap, OSA on CPAP who presented with 3 days of nausea, abdominal pain, and distention on 1/6. He began having nausea and vomiting in the ED. His vomiting was described as brown.  He has been feeling poorly overall since a few days before Christmas.  He had worse edema for the past few weeks and was treated by his PCP with escalating doses of diuretics, which she did not feel were beneficial.  He has not recently had constipation, he was still having bowel movements prior to his abdominal symptoms starting.  Today he has had uncontrolled A-fib with RVR and is now on amiodarone infusion.  The rapid response nurse has had to check on him multiple times throughout the day.  He had a KUB demonstrating a bowel obstruction.  NG tube was placed with rapid removal of about 3 L of gastric fluid.  He has become progressively more somnolent throughout the day.  He has OSA and is compliant with CPAP at home.  Last dose of Eliquis was this morning.  Pertinent  Medical History  Hernia surgery- ex lap with bowel resection and primary anastamosis OSA NASH Afib HTN HFpEF, mild PH Peripheral neuropathy on Lyrica; unsure etiology of neuropathy GERD Remote tobacco abuse-quit 10 years ago, multiple packs per day  Significant Hospital Events: Including procedures, antibiotic start and stop dates in addition to other pertinent events   1/6 admitted 1/7 amiodarone infusion, IVF, NGT placed to decompress abdomen. Transferring to ICU.  Interim History / Subjective:  Wore bipap last night. Still no flatus. Remains in [place] despite [place]. Planned for DCCV  today.  Objective   Blood pressure (!) 131/106, pulse (!) 129, temperature 98.2 F (36.8 C), temperature source Oral, resp. rate 20, height 5\' 8"  (1.727 m), weight (!) 168.9 kg, SpO2 97 %.    FiO2 (%):  [50 %] 50 %   Intake/Output Summary (Last 24 hours) at 01/01/2023 8250 Last data filed at 01/01/2023 0810 Gross per 24 hour  Intake 1811.78 ml  Output 5625 ml  Net -3813.22 ml    Filed Weights   12/30/22 0345 12/31/22 0500 01/01/23 0500  Weight: (!) 176.8 kg (!) 173.1 kg (!) 168.9 kg    Examination: No distress Mild TTP mid abdomen, absent BS Lung/heart sounds distant due to body habitus Ext warm Aox3 Moves all 4 ext to command  Cr improved K low being repleted  Resolved Hospital Problem list     Assessment & Plan:  SBO- ongoing issue Afib/RVR- ongoing issue Acute on chronic hypercapnea (OHS/OSA)- tenuous resp status continues, high intubation risk but improved likely helped by diuresis Class 3 obesity AKI on CKD- with volume overload NASH HTN  - Continue amio, possible DCCV today - SBO medical protocol per CCS, would be a tough surgery given size - Replete K, hold diuresis until this afternoon, recheck K at [facility] and if okay we can redose - Continue BIPAP qHS and PRN - Keep in ICU, high risk for further aspiration and deterioration given fragile baseline cardiopulmonary status.  Discussed with CCS during rounds.  Best Practice (right click and "Reselect all SmartList Selections" daily)  Diet/type: NPO DVT prophylaxis: systemic heparin GI prophylaxis: N/A Lines: N/A Foley:  N/A Code Status:  full code Last date of multidisciplinary goals of care discussion [wife and patient updated 1/7]  Consulting services: cardiology, EP, CCS  31 min cc time Timothy Halsted MD PCCM

## 2023-01-02 ENCOUNTER — Encounter (HOSPITAL_COMMUNITY)
Admission: EM | Disposition: A | Discharge status: Rehabilitation Facility | Payer: Self-pay | Source: Home / Self Care | Attending: Pulmonary Disease

## 2023-01-02 ENCOUNTER — Encounter (HOSPITAL_COMMUNITY): Payer: Self-pay | Admitting: Internal Medicine

## 2023-01-02 ENCOUNTER — Inpatient Hospital Stay (HOSPITAL_COMMUNITY): Payer: BC Managed Care – PPO | Admitting: Certified Registered"

## 2023-01-02 ENCOUNTER — Inpatient Hospital Stay (HOSPITAL_COMMUNITY): Payer: BC Managed Care – PPO

## 2023-01-02 ENCOUNTER — Other Ambulatory Visit: Payer: Self-pay

## 2023-01-02 DIAGNOSIS — K56609 Unspecified intestinal obstruction, unspecified as to partial versus complete obstruction: Secondary | ICD-10-CM | POA: Diagnosis not present

## 2023-01-02 DIAGNOSIS — I4891 Unspecified atrial fibrillation: Secondary | ICD-10-CM | POA: Diagnosis not present

## 2023-01-02 HISTORY — PX: LAPAROTOMY: SHX154

## 2023-01-02 HISTORY — PX: APPLICATION OF WOUND VAC: SHX5189

## 2023-01-02 LAB — POCT I-STAT 7, (LYTES, BLD GAS, ICA,H+H)
Acid-Base Excess: 9 mmol/L — ABNORMAL HIGH (ref 0.0–2.0)
Bicarbonate: 35.5 mmol/L — ABNORMAL HIGH (ref 20.0–28.0)
Calcium, Ion: 1.11 mmol/L — ABNORMAL LOW (ref 1.15–1.40)
HCT: 57 % — ABNORMAL HIGH (ref 39.0–52.0)
Hemoglobin: 19.4 g/dL — ABNORMAL HIGH (ref 13.0–17.0)
O2 Saturation: 100 %
Patient temperature: 36.7
Potassium: 3.2 mmol/L — ABNORMAL LOW (ref 3.5–5.1)
Sodium: 134 mmol/L — ABNORMAL LOW (ref 135–145)
TCO2: 37 mmol/L — ABNORMAL HIGH (ref 22–32)
pCO2 arterial: 48.8 mmHg — ABNORMAL HIGH (ref 32–48)
pH, Arterial: 7.469 — ABNORMAL HIGH (ref 7.35–7.45)
pO2, Arterial: 168 mmHg — ABNORMAL HIGH (ref 83–108)

## 2023-01-02 LAB — POTASSIUM: Potassium: 3.8 mmol/L (ref 3.5–5.1)

## 2023-01-02 LAB — BASIC METABOLIC PANEL
Anion gap: 18 — ABNORMAL HIGH (ref 5–15)
Anion gap: 18 — ABNORMAL HIGH (ref 5–15)
BUN: 49 mg/dL — ABNORMAL HIGH (ref 8–23)
BUN: 46 mg/dL — ABNORMAL HIGH (ref 8–23)
CO2: 35 mmol/L — ABNORMAL HIGH (ref 22–32)
CO2: 36 mmol/L — ABNORMAL HIGH (ref 22–32)
Calcium: 8.9 mg/dL (ref 8.9–10.3)
Calcium: 9.2 mg/dL (ref 8.9–10.3)
Chloride: 83 mmol/L — ABNORMAL LOW (ref 98–111)
Chloride: 83 mmol/L — ABNORMAL LOW (ref 98–111)
Creatinine, Ser: 1.75 mg/dL — ABNORMAL HIGH (ref 0.61–1.24)
Creatinine, Ser: 1.69 mg/dL — ABNORMAL HIGH (ref 0.61–1.24)
GFR, Estimated: 44 mL/min — ABNORMAL LOW (ref >60)
GFR, Estimated: 46 mL/min — ABNORMAL LOW (ref >60)
Glucose, Bld: 80 mg/dL (ref 70–99)
Glucose, Bld: 81 mg/dL (ref 70–99)
Potassium: 2.8 mmol/L — ABNORMAL LOW (ref 3.5–5.1)
Potassium: 3.1 mmol/L — ABNORMAL LOW (ref 3.5–5.1)
Sodium: 136 mmol/L (ref 135–145)
Sodium: 137 mmol/L (ref 135–145)

## 2023-01-02 LAB — GLUCOSE, CAPILLARY
Glucose-Capillary: 102 mg/dL — ABNORMAL HIGH (ref 70–99)
Glucose-Capillary: 111 mg/dL — ABNORMAL HIGH (ref 70–99)
Glucose-Capillary: 74 mg/dL (ref 70–99)
Glucose-Capillary: 78 mg/dL (ref 70–99)
Glucose-Capillary: 84 mg/dL (ref 70–99)
Glucose-Capillary: 91 mg/dL (ref 70–99)
Glucose-Capillary: 99 mg/dL (ref 70–99)

## 2023-01-02 LAB — CBC
HCT: 58.2 % — ABNORMAL HIGH (ref 39.0–52.0)
Hemoglobin: 18.4 g/dL — ABNORMAL HIGH (ref 13.0–17.0)
MCH: 26 pg (ref 26.0–34.0)
MCHC: 31.6 g/dL (ref 30.0–36.0)
MCV: 82.1 fL (ref 80.0–100.0)
Platelets: 179 10*3/uL (ref 150–400)
RBC: 7.09 MIL/uL — ABNORMAL HIGH (ref 4.22–5.81)
RDW: 19.9 % — ABNORMAL HIGH (ref 11.5–15.5)
WBC: 7.8 10*3/uL (ref 4.0–10.5)
nRBC: 0 % (ref 0.0–0.2)

## 2023-01-02 LAB — HEPARIN LEVEL (UNFRACTIONATED): Heparin Unfractionated: >1.1 IU/mL — ABNORMAL HIGH (ref 0.30–0.70)

## 2023-01-02 LAB — MAGNESIUM: Magnesium: 2.2 mg/dL (ref 1.7–2.4)

## 2023-01-02 LAB — APTT: aPTT: 74 seconds — ABNORMAL HIGH (ref 24–36)

## 2023-01-02 LAB — ABO/RH: ABO/RH(D): O POS

## 2023-01-02 LAB — TYPE AND SCREEN
ABO/RH(D): O POS
Antibody Screen: NEGATIVE

## 2023-01-02 SURGERY — LAPAROTOMY, EXPLORATORY
Anesthesia: General | Site: Abdomen

## 2023-01-02 MED ORDER — PROPOFOL 10 MG/ML IV BOLUS
INTRAVENOUS | Status: AC
  Filled 2023-01-02: qty 20

## 2023-01-02 MED ORDER — ACETAMINOPHEN 10 MG/ML IV SOLN
INTRAVENOUS | Status: DC | PRN
  Administered 2023-01-02: 1000 mg via INTRAVENOUS

## 2023-01-02 MED ORDER — CIPROFLOXACIN IN D5W 400 MG/200ML IV SOLN
400.0000 mg | INTRAVENOUS | Status: AC
  Administered 2023-01-02: 400 mg via INTRAVENOUS
  Filled 2023-01-02 (×2): qty 200

## 2023-01-02 MED ORDER — PHENYLEPHRINE 80 MCG/ML (10ML) SYRINGE FOR IV PUSH (FOR BLOOD PRESSURE SUPPORT)
PREFILLED_SYRINGE | INTRAVENOUS | Status: AC
  Filled 2023-01-02: qty 20

## 2023-01-02 MED ORDER — MIDAZOLAM HCL 2 MG/2ML IJ SOLN
INTRAMUSCULAR | Status: DC | PRN
  Administered 2023-01-02: 2 mg via INTRAVENOUS

## 2023-01-02 MED ORDER — POTASSIUM CHLORIDE 10 MEQ/50ML IV SOLN: 10.0000 meq | INTRAVENOUS | Status: DC

## 2023-01-02 MED ORDER — PHENYLEPHRINE 80 MCG/ML (10ML) SYRINGE FOR IV PUSH (FOR BLOOD PRESSURE SUPPORT)
PREFILLED_SYRINGE | INTRAVENOUS | Status: DC | PRN
  Administered 2023-01-02: 240 ug via INTRAVENOUS
  Administered 2023-01-02 (×2): 160 ug via INTRAVENOUS

## 2023-01-02 MED ORDER — POTASSIUM CHLORIDE 10 MEQ/100ML IV SOLN
10.0000 meq | INTRAVENOUS | Status: AC
  Administered 2023-01-02 – 2023-01-03 (×5): 10 meq via INTRAVENOUS
  Filled 2023-01-02 (×4): qty 100

## 2023-01-02 MED ORDER — LIDOCAINE 2% (20 MG/ML) 5 ML SYRINGE
INTRAMUSCULAR | Status: AC
  Filled 2023-01-02: qty 5

## 2023-01-02 MED ORDER — PROPOFOL 1000 MG/100ML IV EMUL
5.0000 ug/kg/min | INTRAVENOUS | Status: DC
  Administered 2023-01-02: 40 ug/kg/min via INTRAVENOUS
  Administered 2023-01-02: 20 ug/kg/min via INTRAVENOUS
  Administered 2023-01-02: 30 ug/kg/min via INTRAVENOUS
  Administered 2023-01-03: 20 ug/kg/min via INTRAVENOUS
  Filled 2023-01-02 (×4): qty 100

## 2023-01-02 MED ORDER — LIDOCAINE 2% (20 MG/ML) 5 ML SYRINGE
INTRAMUSCULAR | Status: DC | PRN
  Administered 2023-01-02: 100 mg via INTRAVENOUS

## 2023-01-02 MED ORDER — FENTANYL 2500MCG IN NS 250ML (10MCG/ML) PREMIX INFUSION
0.0000 ug/h | INTRAVENOUS | Status: DC
  Administered 2023-01-02: 25 ug/h via INTRAVENOUS
  Filled 2023-01-02: qty 250

## 2023-01-02 MED ORDER — ACETAMINOPHEN 10 MG/ML IV SOLN
INTRAVENOUS | Status: AC
  Filled 2023-01-02: qty 100

## 2023-01-02 MED ORDER — CHLORHEXIDINE GLUCONATE 0.12 % MT SOLN
OROMUCOSAL | Status: AC
  Administered 2023-01-02: 15 mL via OROMUCOSAL
  Filled 2023-01-02: qty 15

## 2023-01-02 MED ORDER — POTASSIUM CHLORIDE 10 MEQ/100ML IV SOLN
INTRAVENOUS | Status: AC
  Filled 2023-01-02: qty 100

## 2023-01-02 MED ORDER — CHLORHEXIDINE GLUCONATE 0.12 % MT SOLN: 15.0000 mL | Freq: Once | OROMUCOSAL | Status: AC

## 2023-01-02 MED ORDER — SUCCINYLCHOLINE CHLORIDE 200 MG/10ML IV SOSY
PREFILLED_SYRINGE | INTRAVENOUS | Status: DC | PRN
  Administered 2023-01-02: 200 mg via INTRAVENOUS

## 2023-01-02 MED ORDER — PROPOFOL 10 MG/ML IV BOLUS
INTRAVENOUS | Status: DC | PRN
  Administered 2023-01-02: 200 mg via INTRAVENOUS
  Administered 2023-01-02: 50 mg via INTRAVENOUS

## 2023-01-02 MED ORDER — ROCURONIUM BROMIDE 10 MG/ML (PF) SYRINGE
PREFILLED_SYRINGE | INTRAVENOUS | Status: AC
  Filled 2023-01-02: qty 10

## 2023-01-02 MED ORDER — FENTANYL CITRATE (PF) 100 MCG/2ML IJ SOLN
INTRAMUSCULAR | Status: DC | PRN
  Administered 2023-01-02: 50 ug via INTRAVENOUS
  Administered 2023-01-02: 100 ug via INTRAVENOUS

## 2023-01-02 MED ORDER — FENTANYL CITRATE (PF) 250 MCG/5ML IJ SOLN
INTRAMUSCULAR | Status: AC
  Filled 2023-01-02: qty 5

## 2023-01-02 MED ORDER — PROPOFOL 1000 MG/100ML IV EMUL
INTRAVENOUS | Status: AC
  Filled 2023-01-02: qty 100

## 2023-01-02 MED ORDER — PROPOFOL 500 MG/50ML IV EMUL
INTRAVENOUS | Status: DC | PRN
  Administered 2023-01-02: 50 ug/kg/min via INTRAVENOUS

## 2023-01-02 MED ORDER — CIPROFLOXACIN IN D5W 400 MG/200ML IV SOLN
400.0000 mg | Freq: Two times a day (BID) | INTRAVENOUS | Status: AC
  Administered 2023-01-03 (×2): 400 mg via INTRAVENOUS
  Filled 2023-01-02 (×2): qty 200

## 2023-01-02 MED ORDER — HEPARIN (PORCINE) 25000 UT/250ML-% IV SOLN
2050.0000 [IU]/h | INTRAVENOUS | Status: DC
  Administered 2023-01-03: 1900 [IU]/h via INTRAVENOUS
  Administered 2023-01-03: 2050 [IU]/h via INTRAVENOUS
  Filled 2023-01-02 (×2): qty 250

## 2023-01-02 MED ORDER — ROCURONIUM BROMIDE 10 MG/ML (PF) SYRINGE
PREFILLED_SYRINGE | INTRAVENOUS | Status: DC | PRN
  Administered 2023-01-02: 70 mg via INTRAVENOUS
  Administered 2023-01-02: 30 mg via INTRAVENOUS
  Administered 2023-01-02 (×2): 50 mg via INTRAVENOUS

## 2023-01-02 MED ORDER — ONDANSETRON HCL 4 MG/2ML IJ SOLN
INTRAMUSCULAR | Status: AC
  Filled 2023-01-02: qty 2

## 2023-01-02 MED ORDER — ACETAMINOPHEN 10 MG/ML IV SOLN
1000.0000 mg | Freq: Four times a day (QID) | INTRAVENOUS | Status: AC
  Administered 2023-01-02 – 2023-01-03 (×4): 1000 mg via INTRAVENOUS
  Filled 2023-01-02 (×3): qty 100

## 2023-01-02 MED ORDER — LACTATED RINGERS IV SOLN: INTRAVENOUS | Status: DC

## 2023-01-02 MED ORDER — METRONIDAZOLE 500 MG/100ML IV SOLN
500.0000 mg | INTRAVENOUS | Status: AC
  Administered 2023-01-02: 500 mg via INTRAVENOUS
  Filled 2023-01-02: qty 100

## 2023-01-02 MED ORDER — DEXAMETHASONE SODIUM PHOSPHATE 10 MG/ML IJ SOLN
INTRAMUSCULAR | Status: AC
  Filled 2023-01-02: qty 1

## 2023-01-02 MED ORDER — DEXAMETHASONE SODIUM PHOSPHATE 10 MG/ML IJ SOLN
INTRAMUSCULAR | Status: DC | PRN
  Administered 2023-01-02: 10 mg via INTRAVENOUS

## 2023-01-02 MED ORDER — ACETAMINOPHEN 10 MG/ML IV SOLN
1000.0000 mg | Freq: Four times a day (QID) | INTRAVENOUS | Status: DC
  Filled 2023-01-02: qty 100

## 2023-01-02 MED ORDER — 0.9 % SODIUM CHLORIDE (POUR BTL) OPTIME
TOPICAL | Status: DC | PRN
  Administered 2023-01-02 (×2): 1000 mL

## 2023-01-02 MED ORDER — MIDAZOLAM HCL 2 MG/2ML IJ SOLN
INTRAMUSCULAR | Status: AC
  Filled 2023-01-02: qty 2

## 2023-01-02 MED ORDER — ORAL CARE MOUTH RINSE: 15.0000 mL | OROMUCOSAL | Status: DC | PRN

## 2023-01-02 MED ORDER — POTASSIUM CHLORIDE 10 MEQ/100ML IV SOLN
10.0000 meq | INTRAVENOUS | Status: AC
  Administered 2023-01-02 (×8): 10 meq via INTRAVENOUS
  Filled 2023-01-02 (×8): qty 100

## 2023-01-02 MED ORDER — METRONIDAZOLE 500 MG/100ML IV SOLN
500.0000 mg | Freq: Three times a day (TID) | INTRAVENOUS | Status: AC
  Administered 2023-01-02 – 2023-01-03 (×3): 500 mg via INTRAVENOUS
  Filled 2023-01-02 (×3): qty 100

## 2023-01-02 MED ORDER — ORAL CARE MOUTH RINSE: 15.0000 mL | Freq: Once | OROMUCOSAL | Status: AC

## 2023-01-02 MED ORDER — ORAL CARE MOUTH RINSE
15.0000 mL | OROMUCOSAL | Status: DC
  Administered 2023-01-02 – 2023-01-04 (×16): 15 mL via OROMUCOSAL

## 2023-01-02 SURGICAL SUPPLY — 54 items
APL PRP STRL LF DISP 70% ISPRP (MISCELLANEOUS) ×1
BAG COUNTER SPONGE SURGICOUNT (BAG) ×1 IMPLANT
BAG SPNG CNTER NS LX DISP (BAG) ×1
BLADE CLIPPER SURG (BLADE) IMPLANT
CANISTER SUCT 3000ML PPV (MISCELLANEOUS) ×1 IMPLANT
CANISTER WOUND CARE 500ML ATS (WOUND CARE) IMPLANT
CHLORAPREP W/TINT 26 (MISCELLANEOUS) ×1 IMPLANT
COVER SURGICAL LIGHT HANDLE (MISCELLANEOUS) ×1 IMPLANT
DRAPE LAPAROSCOPIC ABDOMINAL (DRAPES) ×1 IMPLANT
DRAPE WARM FLUID 44X44 (DRAPES) ×1 IMPLANT
DRSG OPSITE POSTOP 4X10 (GAUZE/BANDAGES/DRESSINGS) IMPLANT
DRSG OPSITE POSTOP 4X8 (GAUZE/BANDAGES/DRESSINGS) IMPLANT
DRSG VAC GRANUFOAM MED (GAUZE/BANDAGES/DRESSINGS) IMPLANT
ELECT BLADE 6.5 EXT (BLADE) IMPLANT
ELECT CAUTERY BLADE 6.4 (BLADE) IMPLANT
ELECT REM PT RETURN 9FT ADLT (ELECTROSURGICAL) ×1
ELECTRODE REM PT RTRN 9FT ADLT (ELECTROSURGICAL) ×1 IMPLANT
GLOVE BIO SURGEON STRL SZ 6.5 (GLOVE) IMPLANT
GLOVE BIO SURGEON STRL SZ7 (GLOVE) ×1 IMPLANT
GLOVE BIOGEL PI IND STRL 7.0 (GLOVE) IMPLANT
GLOVE BIOGEL PI IND STRL 7.5 (GLOVE) ×1 IMPLANT
GLOVE ECLIPSE 7.0 STRL STRAW (GLOVE) IMPLANT
GOWN STRL REUS W/ TWL LRG LVL3 (GOWN DISPOSABLE) ×2 IMPLANT
GOWN STRL REUS W/TWL LRG LVL3 (GOWN DISPOSABLE) ×4
HANDLE SUCTION POOLE (INSTRUMENTS) ×1 IMPLANT
KIT BASIN OR (CUSTOM PROCEDURE TRAY) ×1 IMPLANT
KIT TURNOVER KIT B (KITS) ×1 IMPLANT
LIGASURE IMPACT 36 18CM CVD LR (INSTRUMENTS) IMPLANT
NS IRRIG 1000ML POUR BTL (IV SOLUTION) ×2 IMPLANT
PACK GENERAL/GYN (CUSTOM PROCEDURE TRAY) ×1 IMPLANT
PAD ARMBOARD 7.5X6 YLW CONV (MISCELLANEOUS) ×1 IMPLANT
PENCIL SMOKE EVACUATOR (MISCELLANEOUS) ×1 IMPLANT
RELOAD PROXIMATE 75MM BLUE (ENDOMECHANICALS) ×2 IMPLANT
RELOAD STAPLE 75 3.8 BLU REG (ENDOMECHANICALS) IMPLANT
RETAINER VISCERA MED (MISCELLANEOUS) IMPLANT
SPECIMEN JAR LARGE (MISCELLANEOUS) IMPLANT
SPONGE T-LAP 18X18 ~~LOC~~+RFID (SPONGE) IMPLANT
STAPLER GUN LINEAR PROX 60 (STAPLE) IMPLANT
STAPLER PROXIMATE 75MM BLUE (STAPLE) IMPLANT
STAPLER VISISTAT 35W (STAPLE) ×1 IMPLANT
SUCTION POOLE HANDLE (INSTRUMENTS) ×1
SUT NOVA NAB DX-16 0-1 5-0 T12 (SUTURE) IMPLANT
SUT PDS AB 1 TP1 96 (SUTURE) ×2 IMPLANT
SUT SILK 2 0 (SUTURE) ×1
SUT SILK 2 0 SH CR/8 (SUTURE) ×1 IMPLANT
SUT SILK 2-0 18XBRD TIE 12 (SUTURE) ×1 IMPLANT
SUT SILK 3 0 (SUTURE) ×1
SUT SILK 3 0 SH CR/8 (SUTURE) ×1 IMPLANT
SUT SILK 3-0 18XBRD TIE 12 (SUTURE) ×1 IMPLANT
SUT VIC AB 3-0 SH 27 (SUTURE)
SUT VIC AB 3-0 SH 27X BRD (SUTURE) IMPLANT
TOWEL GREEN STERILE (TOWEL DISPOSABLE) ×1 IMPLANT
TRAY FOLEY MTR SLVR 16FR STAT (SET/KITS/TRAYS/PACK) ×1 IMPLANT
YANKAUER SUCT BULB TIP NO VENT (SUCTIONS) IMPLANT

## 2023-01-02 NOTE — Op Note (Signed)
Preoperative diagnosis: SBO Postoperative diagnosis: same as above, related to adhesions Procedure; Exploratory laparotomy with lysis of adhesions times 35 minutes Small bowel resection Placement of negative pressure dressing Surgeon: Dr Serita Grammes Asst: Saverio Danker, PA-C EBL: 50 cc Anesthesia: general  Specimens: small bowel to pathology Complications none Drains none Sponge and needle count correct  Dispo to recovery stable.  Indications: 84 yom with prior elap,sbr and vac for incarcerated [facility] in 2022.  He has an SBO that has not resolved conservatively. He is tender this morning. We discussed proceeding with laparotomy.  Procedure: After informed consent was obtained the patient was taken to the operating room.  He was given antibiotics.  SCDs were in place.  He was placed under general anesthesia without complication.  He was prepped and draped in the standard sterile surgical fashion.  Surgical timeout was then performed.  I used his old midline incision.  I extended this superiorly as well.  He had a significant amount of scar tissue in his abdominal wall due to the negative pressure dressing and he had in 2022.  Eventually I was able to enter into the peritoneal cavity above the old incision.  This was done without injury.  I then slowly was able to open his abdomen.  He had a lot of dilated proximal bowel.  There was a fair amount of congested bowel as well.  Eventually there was bowel that was adherent to his abdominal wall.  I actually went below this and entered into the abdomen and eventually resected a portion of the abdominal wall where it look like the bowel obstruction was.  This relieved the bowel obstruction.  There was a segment of small bowel both with the constricting band as well as an area that was stuck to it that needed to be removed.  There was a small hole in the proximal portion that was related to the bowel obstruction was not possible to avoid.  I then made  holes in the mesentery.  I divided the bowel on either side of the portion it needed to be removed.  I then came across the mesentery with the LigaSure device.  The 2 portions of small bowel were then brought next to each other with 3-0 silk suture.  I then closed the mesenteric defect with 2-0 silk.  I then made an enterotomy proximally with a bowel clamp in place.  I placed a pool sucker and evacuated a large volume of his intestinal contents.  I then made a enterotomy in the distal portion.  I created an anastomosis with a GIA stapler.  This was hemostatic.  Then closed the common enterotomy with a TX stapler.  I placed 2 apex sutures of 3-0 silk.  I then ran the bowel again and it was all healthy and we had relieved the obstruction.  There were no other enterotomies noted.  I then placed this back in the abdomen.  I then closed the fascia and scar with #1 looped PDS and some intermittent #1 Novafil sutures.  A negative pressure dressing was then placed.  He tolerated this well.  Due to his underlying comorbidities he will be transferred back to the intensive care unit on the ventilator.

## 2023-01-02 NOTE — Anesthesia Preprocedure Evaluation (Addendum)
Anesthesia Evaluation  Patient identified by MRN, date of birth, ID band Patient awake    Reviewed: Allergy & Precautions, NPO status , Patient's Chart, lab work & pertinent test results  History of Anesthesia Complications Negative for: history of anesthetic complications  Airway Mallampati: III  TM Distance: >3 FB Neck ROM: Full    Dental  (+) Dental Advisory Given   Pulmonary neg shortness of breath, sleep apnea and Continuous Positive Airway Pressure Ventilation , neg COPD, neg recent URI, former smoker   Pulmonary exam normal breath sounds clear to auscultation       Cardiovascular hypertension (losartan, metoprolol), Pt. on medications and Pt. on home beta blockers (-) angina +CHF  (-) Past MI, (-) Cardiac Stents and (-) CABG + dysrhythmias (on amiodarone gtt, Eliquis; PACs, RBBB) Atrial Fibrillation + Valvular Problems/Murmurs MR  Rhythm:Regular Rate:Normal     Neuro/Psych negative neurological ROS     GI/Hepatic ,GERD  ,,(+) neg Cirrhosis      Fatty liver SBO   Endo/Other  negative endocrine ROS    Renal/GU ARF and CRFRenal disease     Musculoskeletal   Abdominal  (+) + obese  Peds  Hematology negative hematology ROS (+)   Anesthesia Other Findings K 2.8 this morning, now s/p 6 runs of IV Kcl --> 3.1  S/p cardioversion 01/01/23  Last Eliquis: 12/28/22  Reproductive/Obstetrics                             Anesthesia Physical Anesthesia Plan  ASA: 3  Anesthesia Plan: General   Post-op Pain Management:    Induction: Intravenous and Rapid sequence  PONV Risk Score and Plan: 2 and Ondansetron, Dexamethasone and Treatment may vary due to age or medical condition  Airway Management Planned: Oral ETT and Video Laryngoscope Planned  Additional Equipment:   Intra-op Plan:   Post-operative Plan: Extubation in OR and Post-operative intubation/ventilation  Informed Consent: I  have reviewed the patients History and Physical, chart, labs and discussed the procedure including the risks, benefits and alternatives for the proposed anesthesia with the patient or authorized representative who has indicated his/her understanding and acceptance.     Dental advisory given  Plan Discussed with: CRNA and Anesthesiologist  Anesthesia Plan Comments: (Risks of general anesthesia discussed including, but not limited to, sore throat, hoarse voice, chipped/damaged teeth, injury to vocal cords, nausea and vomiting, allergic reactions, lung infection, heart attack, stroke, and death. Also discussed possibility of postop ventilation. All questions answered. )        Anesthesia Quick Evaluation

## 2023-01-02 NOTE — Progress Notes (Signed)
[facility] ADULT ICU REPLACEMENT PROTOCOL   The patient does apply for the [facility] Adult ICU Electrolyte Replacment Protocol based on the criteria listed below:   1.Exclusion criteria: TCTS, ECMO, Dialysis, and Myasthenia Gravis patients 2. Is GFR >/= 30 ml/min? Yes.    Patient's GFR today is 44 3. Is SCr </= 2? Yes.   Patient's SCr is 1.75 mg/dL 4. Did SCr increase >/= 0.5 in 24 hours? No. 5.Pt's weight >40kg  Yes.   6. Abnormal electrolyte(s): K+ 2.8  7. Electrolytes replaced per protocol 8.  Call MD STAT for K+ </= 2.5, Phos </= 1, or Mag </= 1 Physician:  Nicola Police D [place] 01/02/2023 6:00 AM

## 2023-01-02 NOTE — Progress Notes (Signed)
Rounding Note    Patient Name: Timothy Macias Date of Encounter: 01/02/2023  [place] Cardiologist: Quentin Ore and [facility]  Subjective   Some dyspnea and abdominal pain ? Surgery more immanent at this point  Inpatient Medications    Scheduled Meds:  arformoterol  15 mcg Nebulization BID   Chlorhexidine Gluconate Cloth  6 each Topical Daily   insulin aspart  1-3 Units Subcutaneous Q4H   pantoprazole (PROTONIX) IV  40 mg Intravenous Q24H   revefenacin  175 mcg Nebulization Daily   tamsulosin  0.4 mg Oral Daily   Continuous Infusions:  amiodarone 60 mg/hr (01/02/23 0800)   ciprofloxacin     metronidazole     potassium chloride 10 mEq (01/02/23 0808)   PRN Meds: acetaminophen, HYDROmorphone (DILAUDID) injection, menthol-cetylpyridinium, metoprolol tartrate, ondansetron (ZOFRAN) IV, phenol   Vital Signs    Vitals:   01/02/23 0500 01/02/23 0600 01/02/23 0800 01/02/23 0814  BP: 128/72 128/78 139/71 139/71  Pulse: 94 (!) 103 (!) 102 98  Resp: (!) 5 15 16 15   Temp:   98.1 F (36.7 C)   TempSrc:   Oral   SpO2: 96% 99% 93% 92%  Weight: (!) 166.5 kg     Height:        Intake/Output Summary (Last 24 hours) at 01/02/2023 0917 Last data filed at 01/02/2023 0800 Gross per 24 hour  Intake 2042.68 ml  Output 3301 ml  Net -1258.32 ml      01/02/2023    5:00 AM 01/01/2023   12:15 PM 01/01/2023    5:00 AM  Last 3 Weights  Weight (lbs) 367 lb 1.1 oz 372 lb 5.7 oz 372 lb 5.7 oz  Weight (kg) 166.5 kg 168.9 kg 168.9 kg      Telemetry    AF w/ RVR, rates 130-160s - Personally Reviewed  ECG    AF w/ RVR. RBBB. LAFB - Personally Reviewed  Physical Exam   Super morbid obesity Bipap NG tube draining Abdomen distended not acute Lungs clear anteriorly Distant heart sounds Plus 1-2 edema  Labs    High Sensitivity Troponin:   Recent Labs  Lab 12/28/22 1400 12/28/22 1620 12/29/22 1556 12/29/22 1713  TROPONINIHS 9 8 7 7      Chemistry Recent Labs  Lab  12/28/22 1335 12/29/22 0552 12/30/22 1512 12/30/22 2105 12/31/22 0258 12/31/22 0259 01/01/23 0210 01/01/23 1419 01/02/23 0328  NA 133*   < > 131*   < > 133* 134* 135  --  136  K 3.5   < > 3.0*   < > 4.1 4.2 3.0* 3.9 2.8*  CL 90*   < > 86*  --  88* 90* 86*  --  83*  CO2 29   < > 30  --  28 22 30   --  35*  GLUCOSE 112*   < > 114*  --  91 90 84  --  80  BUN 47*   < > 60*  --  61* 63* 56*  --  49*  CREATININE 2.40*   < > 2.46*  --  2.33* 2.29* 1.98*  --  1.75*  CALCIUM 8.9   < > 8.3*  --  8.1* 8.1* 8.8*  --  8.9  MG  --   --  2.1  --  2.2  --  2.3  --  2.2  PROT 7.4  --  6.7  --   --   --   --   --   --  ALBUMIN 3.7  --  3.2*  --   --  3.1*  --   --   --   AST 19  --  22  --   --   --   --   --   --   ALT 14  --  13  --   --   --   --   --   --   ALKPHOS 73  --  51  --   --   --   --   --   --   BILITOT 1.3*  --  1.2  --   --   --   --   --   --   GFRNONAA 30*   < > 29*  --  31* 32* 38*  --  44*  ANIONGAP 14   < > 15  --  17* 22* 19*  --  18*   < > = values in this interval not displayed.    Lipids No results for input(s): "CHOL", "TRIG", "HDL", "LABVLDL", "LDLCALC", "CHOLHDL" in the last 168 hours.  Hematology Recent Labs  Lab 12/31/22 0258 01/01/23 0210 01/02/23 0328  WBC 5.6 6.2 7.8  RBC 6.64* 6.76* 7.09*  HGB 17.1* 17.7* 18.4*  HCT 56.0* 55.4* 58.2*  MCV 84.3 82.0 82.1  MCH 25.8* 26.2 26.0  MCHC 30.5 31.9 31.6  RDW 20.3* 19.9* 19.9*  PLT 196 209 179   Thyroid  Recent Labs  Lab 12/30/22 1450  TSH 0.657    BNP Recent Labs  Lab 12/30/22 1306 12/31/22 0259 01/01/23 0210  BNP 173.4* 72.7 82.1    DDimer  Recent Labs  Lab 12/30/22 1512  DDIMER 3.47*     Radiology    DG Abd Portable 1V  Result Date: 01/02/2023 CLINICAL DATA:  Small-bowel obstruction EXAM: PORTABLE ABDOMEN - 1 VIEW COMPARISON:  12/31/2022 FINDINGS: NG enters the stomach with the tip not visualized. Majority of the abdomen not included on the study. Bibasilar airspace disease left greater  than right. IMPRESSION: NG enters the stomach with the tip not visualized. Majority of the abdomen not included on the study. Electronically Signed   By: Marlan Palau M.D.   On: 01/02/2023 07:48   DG Abd Portable 1V  Result Date: 01/02/2023 CLINICAL DATA:  Small-bowel obstruction EXAM: PORTABLE ABDOMEN - 1 VIEW COMPARISON:  01/02/2023, 12/31/2022 FINDINGS: NG tube in the proximal stomach. Mildly distended loops of colon also present. Moderate amount of stool in the right colon. Right hip replacement IMPRESSION: NG tube in the proximal stomach. Mildly distended colon. Electronically Signed   By: Marlan Palau M.D.   On: 01/02/2023 07:47   DG Abd Portable 1V-Small Bowel Obstruction Protocol-initial, 8 hr delay  Result Date: 12/31/2022 CLINICAL DATA:  Small-bowel obstruction EXAM: PORTABLE ABDOMEN - 1 VIEW COMPARISON:  CT abdomen and pelvis 12/30/2022 FINDINGS: There are numerous air-filled dilated small bowel loops in the central abdomen. These measure up to 7.5 cm, similar to the prior study. Minimal colonic gas is present. Nasogastric tube tip is at the level of the mid stomach, unchanged. Right hip arthroplasty is present. Visualized lung bases are clear. IMPRESSION: Persistent dilated small bowel loops in the central abdomen compatible with small-bowel obstruction. Electronically Signed   By: Darliss Cheney M.D.   On: 12/31/2022 20:10      Assessment & Plan    #Chronic AF On amiodarone and iv lopressor q 3 hours Surgery frowning on PO per NG tube [facility] 01/01/23 with ERAF  Even in sinus was tachycardic with PAC/PVCls If he goes to OR can add Ezmolol per anesthesia Continue heparin coverage   #Chronic diastolic heart failure NYHA III-IV. Symptoms related to HF and weight. Daily weights. Keep K>4, Mg>2 Lasix held due to high gastric output   #Morbid obesity  #Abdominal pain/nausea CT with high grade small bowel obstruction  NG tube  KUB with dilated loops General surgery seeing from notes  appears that surgery more likely      For questions or updates, please contact [place] Please consult [email or web address] for contact info under        Signed, Jenkins Rouge, MD  01/02/2023, 9:17 AM

## 2023-01-02 NOTE — Anesthesia Postprocedure Evaluation (Signed)
Anesthesia Post Note  Patient: Timothy Macias  Procedure(s) Performed: CARDIOVERSION     Patient location during evaluation: Endoscopy Anesthesia Type: General Level of consciousness: awake and alert Pain management: pain level controlled Vital Signs Assessment: post-procedure vital signs reviewed and stable Respiratory status: spontaneous breathing, nonlabored ventilation, respiratory function stable and patient connected to nasal cannula oxygen Cardiovascular status: blood pressure returned to baseline and stable Postop Assessment: no apparent nausea or vomiting Anesthetic complications: no   No notable events documented.  Last Vitals:  Vitals:   01/02/23 0800 01/02/23 0814  BP: 139/71 139/71  Pulse: (!) 102 98  Resp: 16 15  Temp: 36.7 C   SpO2: 93% 92%    Last Pain:  Vitals:   01/02/23 0800  TempSrc: Oral  PainSc: [place]

## 2023-01-02 NOTE — Anesthesia Procedure Notes (Signed)
Procedure Name: Intubation Date/Time: 01/02/2023 2:45 PM  Performed by: Barrington Ellison, CRNAPre-anesthesia Checklist: Patient identified, Emergency Drugs available, Suction available and Patient being monitored Patient Re-evaluated:Patient Re-evaluated prior to induction Oxygen Delivery Method: Circle System Utilized Preoxygenation: Pre-oxygenation with 100% oxygen Induction Type: IV induction, Rapid sequence and Cricoid Pressure applied Laryngoscope Size: Glidescope and 4 Grade View: Grade I Tube type: Oral Tube size: 8.0 mm Number of attempts: 1 Airway Equipment and Method: Stylet and Oral airway Placement Confirmation: ETT inserted through vocal cords under direct vision, positive ETCO2 and breath sounds checked- equal and bilateral Secured at: 23 cm Tube secured with: Tape Dental Injury: Teeth and Oropharynx as per pre-operative assessment  Difficulty Due To: Difficulty was anticipated and Difficult Airway- due to large tongue Future Recommendations: Recommend- induction with short-acting agent, and alternative techniques readily available Comments: Elective Glidescope d/t body habitus

## 2023-01-02 NOTE — Progress Notes (Addendum)
1 Day Post-Op  Subjective: Patient with some centralized abdominal discomfort this morning.  Asking for surgery.  Had a small passage of flatus yesterday when straining to get up to the chair, but otherwise no bowel function.  NGT with about 700cc/24 hrs.  Cardioversion yesterday, still in a fib today.   Objective: Vital signs in last 24 hours: Temp:  [98 F (36.7 C)-98.8 F (37.1 C)] 98.1 F (36.7 C) (01/10 0800) Pulse Rate:  [78-153] 102 (01/10 0800) Resp:  [3-20] 16 (01/10 0800) BP: (97-162)/(46-109) 139/71 (01/10 0800) SpO2:  [90 %-99 %] 93 % (01/10 0800) FiO2 (%):  [50 %] 50 % (01/10 0000) Weight:  [166.5 kg-168.9 kg] 166.5 kg (01/10 0500) Last BM Date : 12/28/22  Intake/Output from previous day: 01/09 0701 - 01/10 0700 In: 2103.4 [P.O.:120; I.V.:1254.7; NG/GT:60; IV Piggyback:668.8] Out: 3300 [Urine:3000; Emesis/NG output:300] Intake/Output this shift: No intake/output data recorded.  PE: Gen: NAD Heart: tachy in 100s-120s, irregular Lungs: normal respiratory effort Abd: morbidly obese, mildly tender in cental upper abdomen, NGT in place with bilious output, 700cc in NGT yesterday  Lab Results:  Recent Labs    01/01/23 0210 01/02/23 0328  WBC 6.2 7.8  HGB 17.7* 18.4*  HCT 55.4* 58.2*  PLT 209 179   BMET Recent Labs    01/01/23 0210 01/01/23 1419 01/02/23 0328  NA 135  --  136  K 3.0* 3.9 2.8*  CL 86*  --  83*  CO2 30  --  35*  GLUCOSE 84  --  80  BUN 56*  --  49*  CREATININE 1.98*  --  1.75*  CALCIUM 8.8*  --  8.9   PT/INR Recent Labs    12/31/22 1215  LABPROT 22.2*  INR 2.0*   CMP     Component Value Date/Time   NA 136 01/02/2023 0328   K 2.8 (L) 01/02/2023 0328   CL 83 (L) 01/02/2023 0328   CO2 35 (H) 01/02/2023 0328   GLUCOSE 80 01/02/2023 0328   BUN 49 (H) 01/02/2023 0328   CREATININE 1.75 (H) 01/02/2023 0328   CALCIUM 8.9 01/02/2023 0328   PROT 6.7 12/30/2022 1512   ALBUMIN 3.1 (L) 12/31/2022 0259   AST 22 12/30/2022 1512    ALT 13 12/30/2022 1512   ALKPHOS 51 12/30/2022 1512   BILITOT 1.2 12/30/2022 1512   GFRNONAA 44 (L) 01/02/2023 0328   Lipase  No results found for: "LIPASE"     Studies/Results: DG Abd Portable 1V  Result Date: 01/02/2023 CLINICAL DATA:  Small-bowel obstruction EXAM: PORTABLE ABDOMEN - 1 VIEW COMPARISON:  12/31/2022 FINDINGS: NG enters the stomach with the tip not visualized. Majority of the abdomen not included on the study. Bibasilar airspace disease left greater than right. IMPRESSION: NG enters the stomach with the tip not visualized. Majority of the abdomen not included on the study. Electronically Signed   By: Franchot Gallo M.D.   On: 01/02/2023 07:48   DG Abd Portable 1V  Result Date: 01/02/2023 CLINICAL DATA:  Small-bowel obstruction EXAM: PORTABLE ABDOMEN - 1 VIEW COMPARISON:  01/02/2023, 12/31/2022 FINDINGS: NG tube in the proximal stomach. Mildly distended loops of colon also present. Moderate amount of stool in the right colon. Right hip replacement IMPRESSION: NG tube in the proximal stomach. Mildly distended colon. Electronically Signed   By: Franchot Gallo M.D.   On: 01/02/2023 07:47   DG Abd Portable 1V-Small Bowel Obstruction Protocol-initial, 8 hr delay  Result Date: 12/31/2022 CLINICAL DATA:  Small-bowel obstruction  EXAM: PORTABLE ABDOMEN - 1 VIEW COMPARISON:  CT abdomen and pelvis 12/30/2022 FINDINGS: There are numerous air-filled dilated small bowel loops in the central abdomen. These measure up to 7.5 cm, similar to the prior study. Minimal colonic gas is present. Nasogastric tube tip is at the level of the mid stomach, unchanged. Right hip arthroplasty is present. Visualized lung bases are clear. IMPRESSION: Persistent dilated small bowel loops in the central abdomen compatible with small-bowel obstruction. Electronically Signed   By: Ronney Asters M.D.   On: 12/31/2022 20:10    Anti-infectives: Anti-infectives (From admission, onward)    None         Assessment/Plan SBO -cont NGT. -repeat films this am still with dilated small bowel.  No bowel function really to speak of though so will need to proceed with surgical exploration at this time -hold heparin now -discussed surgical intervention with the patient and what that means.  We discussed exploratory laparotomy with lysis of adhesions and possible small bowel resection pending findings.  We discussed risks and complications including but not limited to bleeding, infection, post op hernia, wound infection, need to stay on the ventilator, MI, CVA, or death.  He understands all of these things and is eager to proceed.  All questions were answered. -discussed plan and possible need of returning on vent with primary service.  FEN - NPO/NGT/IVFs.  No oral meds as he will not absorb this due to obstruction.  Hypokalemia with K of 2.8.  8 runs of K ordered VTE - heparin gtt, on hold at 0800am ID - none currently needed  Afib - attempted cardioversion yesterday.  SBO pathology/stress may be contributing along with fluid shifts from NGT. CHF HTN OSA NASH Acute on CKD  I reviewed Consultant cardiology notes, hospitalist notes, last 24 h vitals and pain scores, last 48 h intake and output, last 24 h labs and trends, and last 24 h imaging results.   LOS: 5 days    Henreitta Cea , [facility] Surgery 01/02/2023, 8:07 AM Please see Amion for pager number during day hours 7:00am-4:30pm or 7:00am -11:30am on weekends

## 2023-01-02 NOTE — Progress Notes (Signed)
This is a progress note    NAME:  Timothy Macias, MRN:  [id number], DOB:  05/17/1961, LOS: 5 ADMISSION DATE:  12/28/2022, CONSULTATION DATE:  1/7 REFERRING MD:  Loney Hering, CHIEF COMPLAINT:  encephalopathy   History of Present Illness:  Timothy Macias is a 62 y/o gentleman with a history of Afib, HTN, previous SBO due to strangulated peri-umbilical hernia s/p ex-lap, OSA on CPAP who presented with 3 days of nausea, abdominal pain, and distention on 1/6. He began having nausea and vomiting in the ED. His vomiting was described as brown.  He has been feeling poorly overall since a few days before Christmas.  He had worse edema for the past few weeks and was treated by his PCP with escalating doses of diuretics, which she did not feel were beneficial.  He has not recently had constipation, he was still having bowel movements prior to his abdominal symptoms starting.  Today he has had uncontrolled A-fib with RVR and is now on amiodarone infusion.  The rapid response nurse has had to check on him multiple times throughout the day.  He had a KUB demonstrating a bowel obstruction.  NG tube was placed with rapid removal of about 3 L of gastric fluid.  He has become progressively more somnolent throughout the day.  He has OSA and is compliant with CPAP at home.  Last dose of Eliquis was this morning.  Pertinent  Medical History  Hernia surgery- ex lap with bowel resection and primary anastamosis OSA NASH Afib HTN HFpEF, mild PH Peripheral neuropathy on Lyrica; unsure etiology of neuropathy GERD Remote tobacco abuse-quit 10 years ago, multiple packs per day  Significant Hospital Events: Including procedures, antibiotic start and stop dates in addition to other pertinent events   1/6 admitted 1/7 amiodarone infusion, IVF, NGT placed to decompress abdomen. Transferring to ICU. 1/9 DCCV  Interim History / Subjective:  No events, looks like afib on monitor but per cardiology maybe just  PACs?  Unfortunately no flatus, planned for OR later today.  Objective   Blood pressure 139/71, pulse 98, temperature 98.1 F (36.7 C), temperature source Oral, resp. rate 15, height 5\' 8"  (1.727 m), weight (!) 166.5 kg, SpO2 92 %.    FiO2 (%):  [50 %] 50 %   Intake/Output Summary (Last 24 hours) at 01/02/2023 1034 Last data filed at 01/02/2023 0800 Gross per 24 hour  Intake 1922.33 ml  Output 2701 ml  Net -778.67 ml    Filed Weights   01/01/23 0500 01/01/23 1215 01/02/23 0500  Weight: (!) 168.9 kg (!) 168.9 kg (!) 166.5 kg    Examination: No distress Heart sounds irregular: cardiology thinks PACs? Abd distended, absent BS, nontender Lung/heart sounds distant due to body habitus Ext warm Aox3 Moves all 4 ext to command  Cr improving K low being repleted  Resolved Hospital Problem list     Assessment & Plan:  SBO- ongoing issue Afib/RVR- ongoing issue Acute on chronic hypercapnea (OHS/OSA)- tenuous resp status continues, high intubation risk but improved likely helped by diuresis Class 3 obesity AKI on CKD- with volume overload NASH HTN  - Continue amio, heparin on hold - To OR today for ex lap for SBO - Replete K, recheck pre-OR, hold further diuretics for now - Continue BIPAP qHS and PRN - Will likely come back intubated, usual vent/PAD bundle etc, will check on him postop  Best Practice (right click and "Reselect all SmartList Selections" daily)   Diet/type: NPO DVT prophylaxis: on hold for OR  GI prophylaxis: N/A Lines: N/A Foley:  N/A Code Status:  full code Last date of multidisciplinary goals of care discussion [wife and patient updated 1/7]  Consulting services: cardiology, EP, CCS  31 min cc time Erskine Emery MD PCCM

## 2023-01-02 NOTE — Progress Notes (Signed)
Inpatient Rehab Admissions Coordinator:   Consult received and chart reviewed.  Note plans for surgical intervention at this time so we will follow for resolution of SBO and afib.  Shann Medal, PT, DPT Admissions Coordinator [phone] 01/02/23  8:43 AM

## 2023-01-02 NOTE — Transfer of Care (Signed)
Immediate Anesthesia Transfer of Care Note  Patient: Timothy Macias  Procedure(s) Performed: EXPLORATORY LAPAROTOMY WITH LYSIS OF ADHESIONS WITH SMALL BOWEL RESECTION (Abdomen) APPLICATION OF WOUND VAC (Abdomen)  Patient Location: ICU  Anesthesia Type:General  Level of Consciousness: Patient remains intubated per anesthesia plan  Airway & Oxygen Therapy: Patient remains intubated per anesthesia plan and Patient placed on Ventilator (see vital sign flow sheet for setting)  Post-op Assessment: Report given to RN  Post vital signs: Reviewed and stable  Last Vitals:  Vitals Value Taken Time  BP 121/68   Temp    Pulse 69 01/02/23 1627  Resp 20 01/02/23 1627  SpO2 99 % 01/02/23 1627  Vitals shown include unvalidated device data.  Last Pain:  Vitals:   01/02/23 1343  TempSrc: Oral  PainSc:       Patients Stated Pain Goal: 6 (patient has chronic pain and states at home with home medication the lowest his pain gets is 6/10) (56/81/27 5170)  Complications:  Encounter Notable Events  Notable Event Outcome Phase Comment  Difficult to intubate - expected  Intraprocedure Filed from anesthesia note documentation.

## 2023-01-02 NOTE — Anesthesia Postprocedure Evaluation (Signed)
Anesthesia Post Note  Patient: Timothy Macias  Procedure(s) Performed: EXPLORATORY LAPAROTOMY WITH LYSIS OF ADHESIONS WITH SMALL BOWEL RESECTION (Abdomen) APPLICATION OF WOUND VAC (Abdomen)     Patient location during evaluation: SICU Anesthesia Type: General Level of consciousness: sedated and patient remains intubated per anesthesia plan Pain management: pain level controlled Vital Signs Assessment: post-procedure vital signs reviewed and stable Respiratory status: patient remains intubated per anesthesia plan and patient on ventilator - see flowsheet for VS Cardiovascular status: blood pressure returned to baseline and stable Postop Assessment: no apparent nausea or vomiting Anesthetic complications: yes Comments: Pt remains intubated, sedated, abd vac in place   Encounter Notable Events  Notable Event Outcome Phase Comment  Difficult to intubate - expected  Intraprocedure Filed from anesthesia note documentation.    Last Vitals:  Vitals:   01/02/23 1343 01/02/23 1628  BP: 139/84   Pulse: (!) 105   Resp: 18   Temp: 36.6 C   SpO2: 95% 99%    Last Pain:  Vitals:   01/02/23 1343  TempSrc: Oral  PainSc:                  Kota Ciancio,E. Ayla Dunigan

## 2023-01-02 NOTE — Progress Notes (Signed)
ANTICOAGULATION CONSULT NOTE   Pharmacy Consult for heparin Indication: atrial fibrillation  Allergies  Allergen Reactions   Albuterol Swelling   Amiodarone Rash   Penicillins Hives    Patient Measurements: Height: 5\' 8"  (172.7 cm) Weight: (!) 173.3 kg (382 lb) IBW/kg (Calculated) : 68.4 Heparin Dosing Weight: 113kg  Vital Signs: Temp: 98 F (36.7 C) (01/10 1628) Temp Source: Oral (01/10 1628) BP: 137/87 (01/10 1700) Pulse Rate: 76 (01/10 1700)  Labs: Recent Labs    12/31/22 0258 12/31/22 0259 12/31/22 1152 12/31/22 1215 01/01/23 0210 01/02/23 0328 01/02/23 1245  HGB 17.1*  --   --   --  17.7* 18.4*  --   HCT 56.0*  --   --   --  55.4* 58.2*  --   PLT 196  --   --   --  209 179  --   APTT 45*  --  72*  --  74* 74*  --   LABPROT  --   --   --  22.2*  --   --   --   INR  --   --   --  2.0*  --   --   --   HEPARINUNFRC  --  >1.10*  --   --  >1.10* >1.10*  --   CREATININE 2.33* 2.29*  --   --  1.98* 1.75* 1.69*     Estimated Creatinine Clearance: 71.7 mL/min (A) (by C-G formula based on SCr of 1.69 mg/dL (H)).   Medical History: Past Medical History:  Diagnosis Date   Atrial fibrillation (HCC)    CHF (congestive heart failure) ([place])    Fatty liver    Hypertension    Sleep apnea    wears CPAP    Medications:  Medications Prior to Admission  Medication Sig Dispense Refill Last Dose   acetaminophen (TYLENOL) 500 MG tablet Take 1,000 mg by mouth every 8 (eight) hours as needed for mild pain.   Past Month   amiodarone (PACERONE) 200 MG tablet Take 400 mg by mouth daily.   12/28/2022 at 1400   apixaban (ELIQUIS) 5 MG TABS tablet Take 5 mg by mouth daily.   12/28/2022 at 0800   cyclobenzaprine (FLEXERIL) 10 MG tablet Take 10 mg by mouth 3 (three) times daily as needed for muscle spasms.   Past Week   diphenhydramine-acetaminophen (TYLENOL PM) 25-500 MG TABS tablet Take 2 tablets by mouth at bedtime as needed.   12/28/2022   DULoxetine (CYMBALTA) 60 MG capsule Take  60 mg by mouth 2 (two) times daily.   12/28/2022   ferrous sulfate 325 (65 FE) MG EC tablet Take 325 mg by mouth daily with breakfast.   03/30/8294   folic acid (FOLVITE) 1 MG tablet Take 1 mg by mouth daily.   12/28/2022   furosemide (LASIX) 80 MG tablet Take 80 mg by mouth daily.   12/28/2022   LORazepam (ATIVAN) 0.5 MG tablet Take 0.5 mg by mouth every 8 (eight) hours as needed for anxiety.   12/28/2022   losartan (COZAAR) 25 MG tablet Take 12.5 mg by mouth daily.   12/28/2022   Magnesium 400 MG TABS Take 400 mg by mouth daily.   12/28/2022   melatonin 5 MG TABS Take 5-10 mg by mouth at bedtime as needed (sleep).   Past Month   metoprolol succinate (TOPROL-XL) 25 MG 24 hr tablet Take 75 mg by mouth daily.   12/28/2022 at 0800   Multiple Vitamins-Minerals ([place])  CHEW Chew by mouth.   12/27/2022   oxycodone (ROXICODONE) 30 MG immediate release tablet Take 60 mg by mouth 3 (three) times daily as needed (pain).   12/28/2022   pantoprazole (PROTONIX) 40 MG tablet Take 1 tablet by mouth 2 (two) times daily.   12/28/2022   potassium chloride SA (K-DUR) 20 MEQ tablet Take 40 mEq by mouth daily.   12/28/2022   pregabalin (LYRICA) 100 MG capsule Take 100 mg by mouth at bedtime.   12/27/2022   promethazine (PHENERGAN) 25 MG tablet Take 25 mg by mouth every 6 (six) hours as needed for nausea or vomiting.   12/28/2022   SUMAtriptan (IMITREX) 100 MG tablet Take 100 mg by mouth every 2 (two) hours as needed for migraine.   unk   tamsulosin (FLOMAX) 0.4 MG CAPS capsule Take 0.8 mg by mouth daily.   12/28/2022   testosterone cypionate (DEPOTESTOSTERONE CYPIONATE) 200 MG/ML injection Inject 200 mg into the muscle once a week.   12/16/2022   Scheduled:   arformoterol  15 mcg Nebulization BID   Chlorhexidine Gluconate Cloth  6 each Topical Daily   insulin aspart  1-3 Units Subcutaneous Q4H   mouth rinse  15 mL Mouth Rinse Q2H   pantoprazole (PROTONIX) IV  40 mg Intravenous Q24H   revefenacin  175 mcg Nebulization  Daily   tamsulosin  0.4 mg Oral Daily   Infusions:   acetaminophen     amiodarone 60 mg/hr (01/02/23 1430)   [START ON 01/03/2023] ciprofloxacin     fentaNYL infusion INTRAVENOUS     metronidazole     propofol (DIPRIVAN) infusion 50 mcg/kg/min (01/02/23 1658)    Assessment: Pt with a hx of afib on apixaban who was admitted for possible bowel obstruction. Last apixaban dose 12/30/22 @ 8AM. Bridged pre-op with heparin. Expecting heparin level to be elevated due to apixaban. We will use PTT to monitor until correlation.  Heparin turned off prior to surgery - off 1/10 at 0745. S/p exp lap with LOA, SBR. Per surgery, may resume heparin gtt tomorrow am (1/11), no bolus   Goal of Therapy:  Heparin level 0.3-0.5 units/ml aPTT 66-85 sec Monitor platelets by anticoagulation protocol: Yes   Plan:  On 1/11 at 0800, okay to restart heparin infusion at 1900 units/hr. No bolus. Will f/u aPTT, heparin level 8 hr post gtt start Daily aPTT, heparin level, CBC  Sherlon Handing, PharmD, BCPS Please see amion for complete clinical pharmacist phone list 01/02/2023

## 2023-01-02 NOTE — Progress Notes (Signed)
PT Cancellation Note  Patient Details Name: Timothy Macias MRN: [id number] DOB: 10/31/1961   Cancelled Treatment:    Reason Eval/Treat Not Completed: Patient at procedure or test/unavailable   [place]yona Almas, PT, DPT Acute Rehabilitation Services Office [place] 01/02/2023, 1:30 PM

## 2023-01-03 ENCOUNTER — Inpatient Hospital Stay: Payer: Self-pay

## 2023-01-03 DIAGNOSIS — I4891 Unspecified atrial fibrillation: Secondary | ICD-10-CM | POA: Diagnosis not present

## 2023-01-03 DIAGNOSIS — J9622 Acute and chronic respiratory failure with hypercapnia: Secondary | ICD-10-CM | POA: Diagnosis not present

## 2023-01-03 LAB — GLUCOSE, CAPILLARY
Glucose-Capillary: 130 mg/dL — ABNORMAL HIGH (ref 70–99)
Glucose-Capillary: 126 mg/dL — ABNORMAL HIGH (ref 70–99)
Glucose-Capillary: 79 mg/dL (ref 70–99)
Glucose-Capillary: 88 mg/dL (ref 70–99)
Glucose-Capillary: 94 mg/dL (ref 70–99)

## 2023-01-03 LAB — TRIGLYCERIDES: Triglycerides: 127 mg/dL (ref <150)

## 2023-01-03 LAB — CBC
HCT: 56.7 % — ABNORMAL HIGH (ref 39.0–52.0)
Hemoglobin: 18.1 g/dL — ABNORMAL HIGH (ref 13.0–17.0)
MCH: 25.7 pg — ABNORMAL LOW (ref 26.0–34.0)
MCHC: 31.9 g/dL (ref 30.0–36.0)
MCV: 80.5 fL (ref 80.0–100.0)
Platelets: 204 10*3/uL (ref 150–400)
RBC: 7.04 MIL/uL — ABNORMAL HIGH (ref 4.22–5.81)
RDW: 19.9 % — ABNORMAL HIGH (ref 11.5–15.5)
WBC: 13.9 10*3/uL — ABNORMAL HIGH (ref 4.0–10.5)
nRBC: 0 % (ref 0.0–0.2)

## 2023-01-03 LAB — BASIC METABOLIC PANEL
Anion gap: 16 — ABNORMAL HIGH (ref 5–15)
BUN: 39 mg/dL — ABNORMAL HIGH (ref 8–23)
CO2: 33 mmol/L — ABNORMAL HIGH (ref 22–32)
Calcium: 8.8 mg/dL — ABNORMAL LOW (ref 8.9–10.3)
Chloride: 87 mmol/L — ABNORMAL LOW (ref 98–111)
Creatinine, Ser: 1.55 mg/dL — ABNORMAL HIGH (ref 0.61–1.24)
GFR, Estimated: 51 mL/min — ABNORMAL LOW (ref >60)
Glucose, Bld: 104 mg/dL — ABNORMAL HIGH (ref 70–99)
Potassium: 3.8 mmol/L (ref 3.5–5.1)
Sodium: 136 mmol/L (ref 135–145)

## 2023-01-03 LAB — APTT: aPTT: 54 seconds — ABNORMAL HIGH (ref 24–36)

## 2023-01-03 LAB — MAGNESIUM: Magnesium: 2.2 mg/dL (ref 1.7–2.4)

## 2023-01-03 MED ORDER — FENTANYL CITRATE PF 50 MCG/ML IJ SOSY
25.0000 ug | PREFILLED_SYRINGE | INTRAMUSCULAR | Status: DC | PRN
  Administered 2023-01-03 (×5): 25 ug via INTRAVENOUS
  Filled 2023-01-03 (×5): qty 1

## 2023-01-03 MED ORDER — FENTANYL CITRATE PF 50 MCG/ML IJ SOSY
50.0000 ug | PREFILLED_SYRINGE | INTRAMUSCULAR | Status: DC | PRN
  Administered 2023-01-03 – 2023-01-07 (×29): 50 ug via INTRAVENOUS
  Filled 2023-01-03 (×29): qty 1

## 2023-01-03 MED ORDER — ARTIFICIAL TEARS OPHTHALMIC OINT
TOPICAL_OINTMENT | OPHTHALMIC | Status: DC | PRN
  Filled 2023-01-03: qty 3.5

## 2023-01-03 MED ORDER — POTASSIUM CHLORIDE 10 MEQ/100ML IV SOLN
10.0000 meq | INTRAVENOUS | Status: AC
  Administered 2023-01-03 (×2): 10 meq via INTRAVENOUS
  Filled 2023-01-03: qty 100

## 2023-01-03 NOTE — Progress Notes (Signed)
Initial Nutrition Assessment  DOCUMENTATION CODES:   Morbid obesity  INTERVENTION:   Agree with initiation of TPN at this time; plan for initiation of TPN tomorrow   NUTRITION DIAGNOSIS:   Inadequate oral intake related to acute illness, altered GI function as evidenced by NPO status.  GOAL:   Patient will meet greater than or equal to 90% of their needs  MONITOR:   Diet advancement, Labs, Weight trends, I & O's, Skin (TPN)  REASON FOR ASSESSMENT:   Consult, Rounds New TPN/TNA  ASSESSMENT:   62 yo male admitted with A-fib with AVR, acute respiratory failure with acute on chronic CHF and OSA, Developed N/V on day 2 of admissio and found to have SBO. PMH includes hernia surgery with ex lap, bowel resection and primary anastamosis, NASH, HTN, GERD, HFeEF  1/05 Admitted 1/07 Rapid Response, severe abd distention NG for decompression with 3L out, ICU tx 1/10 OR-Ex Lap, LOA x 35 minutes, SB resection, placement of wound VAC  NPO/CL day 6. Per Dr. Donne Hazel, prolonged ileus expected.   Surgery noted indicating recommendation for TPN initiation today. Upon assessment, RD noted TPN orders not placed yet. Reached out to Dr. Lynetta Mare and TPN consult to Pharmacy placed. TPN consult placed after 12pm so TPN will be unable to start until tomorrow. MD made aware.Noted Pharmacy ordered TPN labs for tomorrow AM  Wound VAC in place with some bloody drainage NG to LIS with bilious output  Current wt 169.8 kg, weight up to 173.3 kg post surgery yesterday. Admit weight around 173 kg. Net negative 12L per I/O flow sheet but this includes NG output which has been significant. Pt with reported weight gain PTA, current dry weight unknown at present.   Labs: reviewed Meds: reviewed  NUTRITION - FOCUSED PHYSICAL EXAM:  Flowsheet Row Most Recent Value  Orbital Region No depletion  Upper Arm Region No depletion  Thoracic and Lumbar Region No depletion  Buccal Region No depletion  Temple  Region No depletion  Clavicle Bone Region No depletion  Clavicle and Acromion Bone Region No depletion  Scapular Bone Region No depletion  Dorsal Hand Unable to assess  Patellar Region Unable to assess  Anterior Thigh Region Unable to assess  Posterior Calf Region Unable to assess  Edema (RD Assessment) Moderate       Diet Order:   Diet Order             Diet NPO time specified  Diet effective now                   EDUCATION NEEDS:   Not appropriate for education at this time  Skin:  Skin Assessment: Skin Integrity Issues: Skin Integrity Issues:: Wound VAC Wound Vac: surgical abdominal incision  Last BM:  1/5, no flatus, no BS, abdomen distended/obese  Height:   Ht Readings from Last 1 Encounters:  01/02/23 5\' 8"  (1.727 m)    Weight:   Wt Readings from Last 1 Encounters:  01/03/23 (!) 169.8 kg     BMI:  Body mass index is 56.92 kg/m.  Estimated Nutritional Needs:   Kcal:  2000-2200 kcals  Protein:  140-160 g  Fluid:  2L   Kerman Passey MS, RDN, LDN, CNSC Registered Dietitian 3 Clinical Nutrition RD Pager and On-Call Pager Number Located in [place]

## 2023-01-03 NOTE — Progress Notes (Signed)
[place] Progress Note Patient Name: [name] DOB: Jul 11, 1961 MRN: [id number]   Date of Service  01/03/2023  HPI/Events of Note  Pain - Patient request better pain management.   eICU Interventions  Plan: Increase Fentanyl to 50 mcg IV Q 2 hours PRN severe pain.      Intervention Category Major Interventions: Other:  Lysle Dingwall 01/03/2023, 9:51 PM

## 2023-01-03 NOTE — Progress Notes (Addendum)
PHARMACY - TOTAL PARENTERAL NUTRITION CONSULT NOTE  Indication: Prolonged ileus  Patient Measurements: Height: 5\' 8"  (172.7 cm) Weight: (!) 169.8 kg (374 lb 5.5 oz) IBW/kg (Calculated) : 68.4 TPN AdjBW (KG): 94.6 Body mass index is 56.92 kg/m. Usual Weight: 143.8 kg in May 2023  Assessment:  1 YOM presented on 1/5 with SOB.  Patient reports a 43 lb weight gain from fluid retention.  PMH significant for HF, OSA, Afib and umbilical hernia s/p ex-lap.  Found to have SBO and treated conservatively without improvement.  Underwent ex-lap with LoA and SBR on 1/10.  Patient has been NPO since admission and Pharmacy consulted to manage TPN for expected prolonged ileus.    Glucose / Insulin: no hx DM - CBGs < 180 Electrolytes: K 3.8 (received 13 runs, goal >/= 4 for ileus), low CL, high CO2, others WNL Renal: SCr down to 1.55, BUN 39 - last Lasix on 1/9 Hepatic: LFTs / tbili / TG WNL Intake / Output; MIVF:  GI Imaging:  GI Surgeries / Procedures:   Central access: PICC to be placed TPN start date: 01/04/23  Nutritional Goals: RD Assessment:    Current Nutrition:  NPO  Plan:  Start concentrated TPN on 1/12 KCL x 2 runs Standard TPN labs and nursing care orders  Timothy Macias D. Mina Marble, PharmD, BCPS, [place] 01/03/2023, 2:41 PM

## 2023-01-03 NOTE — Progress Notes (Signed)
Rounding Note    Patient Name: Gianlucca Szymborski Date of Encounter: 01/03/2023  [place] Cardiologist: Quentin Ore and [facility]  Subjective   Cheerful considering breathing at baseline   Inpatient Medications    Scheduled Meds:  arformoterol  15 mcg Nebulization BID   Chlorhexidine Gluconate Cloth  6 each Topical Daily   insulin aspart  1-3 Units Subcutaneous Q4H   mouth rinse  15 mL Mouth Rinse Q2H   pantoprazole (PROTONIX) IV  40 mg Intravenous Q24H   revefenacin  175 mcg Nebulization Daily   tamsulosin  0.4 mg Oral Daily   Continuous Infusions:  acetaminophen 400 mL/hr at 01/03/23 0700   amiodarone 60 mg/hr (01/03/23 0700)   ciprofloxacin Stopped (01/03/23 0223)   heparin 1,900 Units/hr (01/03/23 0819)   metronidazole 500 mg (01/03/23 0740)   PRN Meds: fentaNYL (SUBLIMAZE) injection, menthol-cetylpyridinium, metoprolol tartrate, ondansetron (ZOFRAN) IV, mouth rinse, phenol   Vital Signs    Vitals:   01/03/23 0600 01/03/23 0700 01/03/23 0745 01/03/23 0759  BP: (!) 141/99 (!) 145/77    Pulse: 86 (!) 107    Resp: 17 (!) 21    Temp:   98.6 F (37 C)   TempSrc:   Oral   SpO2: 92% 95%  98%  Weight:      Height:        Intake/Output Summary (Last 24 hours) at 01/03/2023 0901 Last data filed at 01/03/2023 0700 Gross per 24 hour  Intake 4085.16 ml  Output 3400 ml  Net 685.16 ml      01/03/2023    5:00 AM 01/02/2023    1:43 PM 01/02/2023    5:00 AM  Last 3 Weights  Weight (lbs) 374 lb 5.5 oz 382 lb 367 lb 1.1 oz  Weight (kg) 169.8 kg 173.274 kg 166.5 kg      Telemetry    AF w/ RVR, rates 90-100  - Personally Reviewed  ECG    AF w/ RVR. RBBB. LAFB - Personally Reviewed  Physical Exam   Super morbid obesity NG tube draining Post surgery with wound vac looks good  Lungs clear anteriorly Distant heart sounds Plus 1-2 edema  Labs    High Sensitivity Troponin:   Recent Labs  Lab 12/28/22 1400 12/28/22 1620 12/29/22 1556 12/29/22 1713   TROPONINIHS 9 8 7 7      Chemistry Recent Labs  Lab 12/28/22 1335 12/29/22 0552 12/30/22 1512 12/30/22 2105 12/31/22 0259 01/01/23 0210 01/01/23 1419 01/02/23 0328 01/02/23 1245 01/02/23 1710 01/02/23 1711 01/03/23 0554  NA 133*   < > 131*   < > 134* 135  --  136 137  --  134* 136  K 3.5   < > 3.0*   < > 4.2 3.0*   < > 2.8* 3.1* 3.8 3.2* 3.8  CL 90*   < > 86*   < > 90* 86*  --  83* 83*  --   --  87*  CO2 29   < > 30   < > 22 30  --  35* 36*  --   --  33*  GLUCOSE 112*   < > 114*   < > 90 84  --  80 81  --   --  104*  BUN 47*   < > 60*   < > 63* 56*  --  49* 46*  --   --  39*  CREATININE 2.40*   < > 2.46*   < > 2.29* 1.98*  --  1.75* 1.69*  --   --  1.55*  CALCIUM 8.9   < > 8.3*   < > 8.1* 8.8*  --  8.9 9.2  --   --  8.8*  MG  --   --  2.1   < >  --  2.3  --  2.2  --   --   --  2.2  PROT 7.4  --  6.7  --   --   --   --   --   --   --   --   --   ALBUMIN 3.7  --  3.2*  --  3.1*  --   --   --   --   --   --   --   AST 19  --  22  --   --   --   --   --   --   --   --   --   ALT 14  --  13  --   --   --   --   --   --   --   --   --   ALKPHOS 73  --  51  --   --   --   --   --   --   --   --   --   BILITOT 1.3*  --  1.2  --   --   --   --   --   --   --   --   --   GFRNONAA 30*   < > 29*   < > 32* 38*  --  44* 46*  --   --  51*  ANIONGAP 14   < > 15   < > 22* 19*  --  18* 18*  --   --  16*   < > = values in this interval not displayed.    Lipids  Recent Labs  Lab 01/03/23 0554  TRIG 127    Hematology Recent Labs  Lab 01/01/23 0210 01/02/23 0328 01/02/23 1711 01/03/23 0554  WBC 6.2 7.8  --  13.9*  RBC 6.76* 7.09*  --  7.04*  HGB 17.7* 18.4* 19.4* 18.1*  HCT 55.4* 58.2* 57.0* 56.7*  MCV 82.0 82.1  --  80.5  MCH 26.2 26.0  --  25.7*  MCHC 31.9 31.6  --  31.9  RDW 19.9* 19.9*  --  19.9*  PLT 209 179  --  204   Thyroid  Recent Labs  Lab 12/30/22 1450  TSH 0.657    BNP Recent Labs  Lab 12/30/22 1306 12/31/22 0259 01/01/23 0210  BNP 173.4* 72.7 82.1     DDimer  Recent Labs  Lab 12/30/22 1512  DDIMER 3.47*     Radiology    DG Abd Portable 1V  Result Date: 01/02/2023 CLINICAL DATA:  Small-bowel obstruction EXAM: PORTABLE ABDOMEN - 1 VIEW COMPARISON:  12/31/2022 FINDINGS: NG enters the stomach with the tip not visualized. Majority of the abdomen not included on the study. Bibasilar airspace disease left greater than right. IMPRESSION: NG enters the stomach with the tip not visualized. Majority of the abdomen not included on the study. Electronically Signed   By: Franchot Gallo M.D.   On: 01/02/2023 07:48   DG Abd Portable 1V  Result Date: 01/02/2023 CLINICAL DATA:  Small-bowel obstruction EXAM: PORTABLE ABDOMEN - 1 VIEW COMPARISON:  01/02/2023, 12/31/2022 FINDINGS: NG tube in the proximal stomach. Mildly distended loops  of colon also present. Moderate amount of stool in the right colon. Right hip replacement IMPRESSION: NG tube in the proximal stomach. Mildly distended colon. Electronically Signed   By: Marlan Palau M.D.   On: 01/02/2023 07:47      Assessment & Plan    #Chronic AF On amiodarone and iv lopressor q 3 hours Surgery frowning on PO per NG tube [facility] 01/01/23 with ERAF Even in sinus was tachycardic with PAC/PVCls Continue heparin coverage  ? Start oral loprsser per tube in 48 hrs   #Chronic diastolic heart failure NYHA III-IV. Symptoms related to HF and weight. Daily weights. Keep K>4, Mg>2 Lasix held due to high gastric output   #Morbid obesity  #Abdominal pain/nausea Post bowel resection  NG tube Wound vac Antibiotic coverage with Metronidazole and Cipro      For questions or updates, please contact [facility] Please consult [email or web address] for contact info under        Signed, Charlton Haws, MD  01/03/2023, 9:01 AM

## 2023-01-03 NOTE — Progress Notes (Signed)
Occupational Therapy Treatment Patient Details Name: Timothy Macias MRN: [id number] DOB: 1961/01/07 Today's Date: 01/03/2023   History of present illness Pt is a 62 y.o. M who presents 12/30/2022 with SBO, afib/RVR, acute on chronic hypercapnea. Pt underwent cardioversion on 1/09. Underwent exploratory laparotomy for lysis of adhesions and small bowel resection on 1/10.  Significant PMH: Afib, HTN, previous SBO due to strangulated peri-umbilical hernia s/p ex-lap, OSA on CPAP.   OT comments  Pt seen s/p ex lap. +2 assist for bed mobility, tolerated sitting EOB x 5 minutes before abdominal and back pain became too great. Did not attempt standing this visit. Continue to recommend AIR for intensive rehab.   Recommendations for follow up therapy are one component of a multi-disciplinary discharge planning process, led by the attending physician.  Recommendations may be updated based on patient status, additional functional criteria and insurance authorization.    Follow Up Recommendations  Acute inpatient rehab (3hours/day)     Assistance Recommended at Discharge Frequent or constant Supervision/Assistance  Patient can return home with the following  A lot of help with walking and/or transfers;A lot of help with bathing/dressing/bathroom;Assistance with cooking/housework;Assistance with feeding;Direct supervision/assist for medications management;Direct supervision/assist for financial management;Assist for transportation;Help with stairs or ramp for entrance   Equipment Recommendations  None recommended by OT    Recommendations for Other Services Rehab consult    Precautions / Restrictions Precautions Precautions: Fall;Other (comment) Precaution Comments: NGT, watch HR Required Braces or Orthoses: Other Brace Other Brace: bilat AFO's Restrictions Weight Bearing Restrictions: No       Mobility Bed Mobility Overal bed mobility: Needs Assistance Bed Mobility: Rolling, Sidelying to  Sit, Sit to Sidelying Rolling: Max assist Sidelying to sit: +2 for physical assistance, Max assist Supine to sit: Mod assist, +2 for physical assistance   Sit to sidelying: +2 for physical assistance, Max assist General bed mobility comments: Assist to bring hips and shoulders over. Assist to bring legs off of bed, elevate trunk into sitting, and bring hips to EOB. Assist to lower trunk and bring legs back up into bed returning to sidelying.    Transfers                   General transfer comment: Did not attempt     Balance Overall balance assessment: Needs assistance Sitting-balance support: Feet supported, No upper extremity supported Sitting balance-Leahy Scale: Fair                                     ADL either performed or assessed with clinical judgement   ADL                       Lower Body Dressing: Total assistance;Bed level Lower Body Dressing Details (indicate cue type and reason): socks                    Extremity/Trunk Assessment              Vision       Perception     Praxis      Cognition Arousal/Alertness: Awake/alert Behavior During Therapy: WFL for tasks assessed/performed Overall Cognitive Status: Within Functional Limits for tasks assessed  Exercises      Shoulder Instructions       General Comments HR to 140's with mobility, pt in afib    Pertinent Vitals/ Pain       Pain Assessment Pain Assessment: Faces Faces Pain Scale: Hurts even more Pain Location: back and abdomen Pain Descriptors / Indicators: Grimacing, Guarding Pain Intervention(s): Monitored during session, Repositioned  Home Living                                          Prior Functioning/Environment              Frequency  Min 2X/week        Progress Toward Goals  OT Goals(current goals can now be found in the care plan section)   Progress towards OT goals: Not progressing toward goals - comment (pt with new abdominal incision)  Acute Rehab OT Goals OT Goal Formulation: With patient Time For Goal Achievement: 01/15/23 Potential to Achieve Goals: Good  Plan Discharge plan remains appropriate    Co-evaluation    PT/OT/SLP Co-Evaluation/Treatment: Yes Reason for Co-Treatment: For patient/therapist safety;Complexity of the patient's impairments (multi-system involvement)          AM-PAC OT "6 Clicks" Daily Activity     Outcome Measure   Help from another person eating meals?: Total Help from another person taking care of personal grooming?: A Little Help from another person toileting, which includes using toliet, bedpan, or urinal?: Total Help from another person bathing (including washing, rinsing, drying)?: A Lot Help from another person to put on and taking off regular upper body clothing?: A Lot Help from another person to put on and taking off regular lower body clothing?: Total 6 Click Score: 10    End of Session    OT Visit Diagnosis: Unsteadiness on feet (R26.81);Other abnormalities of gait and mobility (R26.89);Muscle weakness (generalized) (M62.81);Pain   Activity Tolerance Patient limited by fatigue;Patient limited by pain   Patient Left in bed;with call bell/phone within reach;with bed alarm set;with family/visitor present   Nurse Communication Mobility status        Time: 1352-1420 OT Time Calculation (min): 28 min  Charges: OT General Charges $OT Visit: 1 Visit OT Treatments $Therapeutic Activity: 8-22 mins  Cleta Alberts, OTR/L Acute Rehabilitation Services Office: [phone]   [name] 01/03/2023, 4:08 PM

## 2023-01-03 NOTE — Procedures (Addendum)
Extubation Procedure Note  Patient Details:   Name: Timothy Macias DOB: August 19, 1961 MRN: [id number]   Airway Documentation:    Vent end date: 01/03/23 Vent end time: 0819   Evaluation  O2 sats: stable throughout Complications: No apparent complications Patient did tolerate procedure well. Bilateral Breath Sounds: Diminished, Rhonchi   Yes  Pt extubated to 3L [place], pt tolerating well at this time. No stridor noted, cuff leak present, RN at bedside, MD at bedside, RT will monitor.   Timothy Macias 01/03/2023, 8:21 AM

## 2023-01-03 NOTE — Progress Notes (Signed)
This is a progress note    NAME:  Timothy Macias, MRN:  [id number], DOB:  [birth year], LOS: 6 ADMISSION DATE:  12/28/2022, CONSULTATION DATE:  1/7 REFERRING MD:  Loney Hering, CHIEF COMPLAINT:  encephalopathy   History of Present Illness:  Timothy Macias is a 62 y/o gentleman with a history of Afib, HTN, previous SBO due to strangulated peri-umbilical hernia s/p ex-lap, OSA on CPAP who presented with 3 days of nausea, abdominal pain, and distention on 1/6. He began having nausea and vomiting in the ED. His vomiting was described as brown.  He has been feeling poorly overall since a few days before Christmas.  He had worse edema for the past few weeks and was treated by his PCP with escalating doses of diuretics, which she did not feel were beneficial.  He has not recently had constipation, he was still having bowel movements prior to his abdominal symptoms starting.  Today he has had uncontrolled A-fib with RVR and is now on amiodarone infusion.  The rapid response nurse has had to check on him multiple times throughout the day.  He had a KUB demonstrating a bowel obstruction.  NG tube was placed with rapid removal of about 3 L of gastric fluid.  He has become progressively more somnolent throughout the day.  He has OSA and is compliant with CPAP at home.  Last dose of Eliquis was this morning.  Pertinent  Medical History  Hernia surgery- ex lap with bowel resection and primary anastamosis OSA NASH Afib HTN HFpEF, mild PH Peripheral neuropathy on Lyrica; unsure etiology of neuropathy GERD Remote tobacco abuse-quit 10 years ago, multiple packs per day  Significant Hospital Events: Including procedures, antibiotic start and stop dates in addition to other pertinent events   1/6 admitted 1/7 amiodarone infusion, IVF, NGT placed to decompress abdomen. Transferring to ICU. 1/9 DCCV failed due to ongoing bowel obstruction. 1/10 underwent laparotomy with lysis of adhesions  Interim History /  Subjective:  Awake this morning complaining of tube discomfort.  Objective   Blood pressure 132/77, pulse (!) 111, temperature 97.9 F (36.6 C), temperature source Oral, resp. rate 19, height 5\' 8"  (1.727 m), weight (!) 169.8 kg, SpO2 93 %.    Vent Mode: PSV;CPAP FiO2 (%):  [40 %-100 %] 40 % Set Rate:  [18 bmp] 18 bmp Vt Set:  [540 mL] 540 mL PEEP:  [5 cmH20] 5 cmH20 Pressure Support:  [5 cmH20] 5 cmH20 Plateau Pressure:  [16 cmH20-17 cmH20] 16 cmH20   Intake/Output Summary (Last 24 hours) at 01/03/2023 1409 Last data filed at 01/03/2023 1210 Gross per 24 hour  Intake 4097.67 ml  Output 2450 ml  Net 1647.67 ml    Filed Weights   01/02/23 0500 01/02/23 1343 01/03/23 0500  Weight: (!) 166.5 kg (!) 173.3 kg (!) 169.8 kg    Examination: General: Obese man in distress uncomfortable from endotracheal tube HEENT: Nasogastric tube in place, ET tube in place Pulmonary: Chest clear to auscultation bilaterally, no wheezing.  Tolerating pressure support ventilation. Cardiac: Heart sounds are unremarkable. Abdomen: Protuberant, lower midline incision with VAC dressing in place.  Bowel sounds absent. GU Foley catheter in place with amber urine.  Ancillary tests personally reviewed  Mild leukocytosis 13.9 Elevated hemoglobin 18.1 (hemoconcentration) Improving creatinine 1.55 Assessment & Plan:  SBO status post laparotomy and lysis of adhesions.  Delayed bowel function Acute on chronic hypoxic hypercapnic respiratory failure (OHS/OSA)-polycythemia may be the result of chronic hypoxia versus hemoconcentration.  If latter will improve with progressive  rehydration. Class 3 obesity Afib/RVR- ongoing issue AKI on CKD- with volume overload NASH HTN  -Extubated today, will need nocturnal BiPAP - Continue amio, resume heparin. - start TPN tomorrow - Continue BIPAP qHS and PRN -Minimize narcotic use  Best Practice (right click and "Reselect all SmartList Selections" daily)   Diet/type:  NPO start TPN tomorrow DVT prophylaxis: Resume IV heparin GI prophylaxis: Protonix Lines: Left IJ central line, arterial line Foley: External catheter Code Status:  full code Last date of multidisciplinary goals of care discussion [wife and patient updated 1/7]  CRITICAL CARE Performed by: Kipp Brood  Total critical care time: 34 minutes  Critical care time was exclusive of separately billable procedures and treating other patients.  Critical care was necessary to treat or prevent imminent or life-threatening deterioration.  Critical care was time spent personally by me on the following activities: development of treatment plan with patient and/or surrogate as well as nursing, discussions with consultants, evaluation of patient's response to treatment, examination of patient, obtaining history from patient or surrogate, ordering and performing treatments and interventions, ordering and review of laboratory studies, ordering and review of radiographic studies, pulse oximetry, re-evaluation of patient's condition and participation in multidisciplinary rounds.  Kipp Brood, MD [facility] ICU Physician [place]  Pager: [phone] Mobile: [phone] After hours: [phone].

## 2023-01-03 NOTE — Progress Notes (Signed)
ANTICOAGULATION CONSULT NOTE   Pharmacy Consult for heparin Indication: atrial fibrillation  Allergies  Allergen Reactions   Albuterol Swelling   Amiodarone Rash   Penicillins Hives    Patient Measurements: Height: 5\' 8"  (172.7 cm) Weight: (!) 169.8 kg (374 lb 5.5 oz) IBW/kg (Calculated) : 68.4 Heparin Dosing Weight: 113kg  Vital Signs: Temp: 97.7 F (36.5 C) (01/11 1514) Temp Source: Oral (01/11 1514) BP: 132/77 (01/11 1200) Pulse Rate: 111 (01/11 1200)  Labs: Recent Labs    01/01/23 0210 01/02/23 0328 01/02/23 1245 01/02/23 1711 01/03/23 0554 01/03/23 1537  HGB 17.7* 18.4*  --  19.4* 18.1*  --   HCT 55.4* 58.2*  --  57.0* 56.7*  --   PLT 209 179  --   --  204  --   APTT 74* 74*  --   --   --  54*  HEPARINUNFRC >1.10* >1.10*  --   --   --   --   CREATININE 1.98* 1.75* 1.69*  --  1.55*  --      Estimated Creatinine Clearance: 77.2 mL/min (A) (by C-G formula based on SCr of 1.55 mg/dL (H)).   Medical History: Past Medical History:  Diagnosis Date   Atrial fibrillation (HCC)    CHF (congestive heart failure) ([place])    Fatty liver    Hypertension    Sleep apnea    wears CPAP    Medications:  Medications Prior to Admission  Medication Sig Dispense Refill Last Dose   acetaminophen (TYLENOL) 500 MG tablet Take 1,000 mg by mouth every 8 (eight) hours as needed for mild pain.   Past Month   amiodarone (PACERONE) 200 MG tablet Take 400 mg by mouth daily.   12/28/2022 at 1400   apixaban (ELIQUIS) 5 MG TABS tablet Take 5 mg by mouth daily.   12/28/2022 at 0800   cyclobenzaprine (FLEXERIL) 10 MG tablet Take 10 mg by mouth 3 (three) times daily as needed for muscle spasms.   Past Week   diphenhydramine-acetaminophen (TYLENOL PM) 25-500 MG TABS tablet Take 2 tablets by mouth at bedtime as needed.   12/28/2022   DULoxetine (CYMBALTA) 60 MG capsule Take 60 mg by mouth 2 (two) times daily.   12/28/2022   ferrous sulfate 325 (65 FE) MG EC tablet Take 325 mg by mouth daily with  breakfast.   12/29/1094   folic acid (FOLVITE) 1 MG tablet Take 1 mg by mouth daily.   12/28/2022   furosemide (LASIX) 80 MG tablet Take 80 mg by mouth daily.   12/28/2022   LORazepam (ATIVAN) 0.5 MG tablet Take 0.5 mg by mouth every 8 (eight) hours as needed for anxiety.   12/28/2022   losartan (COZAAR) 25 MG tablet Take 12.5 mg by mouth daily.   12/28/2022   Magnesium 400 MG TABS Take 400 mg by mouth daily.   12/28/2022   melatonin 5 MG TABS Take 5-10 mg by mouth at bedtime as needed (sleep).   Past Month   metoprolol succinate (TOPROL-XL) 25 MG 24 hr tablet Take 75 mg by mouth daily.   12/28/2022 at 0800   Multiple Vitamins-Minerals (YOUR LIFE MULTI ADULT GUMMIES) CHEW Chew by mouth.   12/27/2022   oxycodone (ROXICODONE) 30 MG immediate release tablet Take 60 mg by mouth 3 (three) times daily as needed (pain).   12/28/2022   pantoprazole (PROTONIX) 40 MG tablet Take 1 tablet by mouth 2 (two) times daily.   12/28/2022   potassium chloride SA (K-DUR) 20 MEQ  tablet Take 40 mEq by mouth daily.   12/28/2022   pregabalin (LYRICA) 100 MG capsule Take 100 mg by mouth at bedtime.   12/27/2022   promethazine (PHENERGAN) 25 MG tablet Take 25 mg by mouth every 6 (six) hours as needed for nausea or vomiting.   12/28/2022   SUMAtriptan (IMITREX) 100 MG tablet Take 100 mg by mouth every 2 (two) hours as needed for migraine.   unk   tamsulosin (FLOMAX) 0.4 MG CAPS capsule Take 0.8 mg by mouth daily.   12/28/2022   testosterone cypionate (DEPOTESTOSTERONE CYPIONATE) 200 MG/ML injection Inject 200 mg into the muscle once a week.   12/16/2022   Scheduled:   arformoterol  15 mcg Nebulization BID   Chlorhexidine Gluconate Cloth  6 each Topical Daily   insulin aspart  1-3 Units Subcutaneous Q4H   mouth rinse  15 mL Mouth Rinse Q2H   pantoprazole (PROTONIX) IV  40 mg Intravenous Q24H   revefenacin  175 mcg Nebulization Daily   tamsulosin  0.4 mg Oral Daily   Infusions:   acetaminophen Stopped (01/03/23 1225)   amiodarone 60 mg/hr  (01/03/23 1700)   heparin 1,900 Units/hr (01/03/23 1210)   potassium chloride 100 mL/hr at 01/03/23 1700    Assessment: Pt with a hx of afib on apixaban who was admitted for possible bowel obstruction. Last apixaban dose 12/30/22 @ 8AM. Bridged pre-op with heparin. Expecting heparin level to be elevated due to apixaban. We will use PTT to monitor until correlation.  Heparin resumed this morning. Afternoon aPTT is subtherapeutic at 54 seconds.  Goal of Therapy:  Heparin level 0.3-0.5 units/ml aPTT 66-85 sec Monitor platelets by anticoagulation protocol: Yes   Plan:  Increase heparin to 2050 units/h Recheck aPTT and heparin level with am labs  Arrie Senate, PharmD, BCPS, [facility] Clinical Pharmacist [phone] Please check AMION for all [place] numbers 01/03/2023

## 2023-01-03 NOTE — Progress Notes (Signed)
1 Day Post-Op   Subjective/Chief Complaint: Intubated, responsive   Objective: Vital signs in last 24 hours: Temp:  [97.8 F (36.6 C)-99.3 F (37.4 C)] 98.6 F (37 C) (01/11 0745) Pulse Rate:  [69-107] 107 (01/11 0700) Resp:  [12-24] 21 (01/11 0700) BP: (120-149)/(68-102) 145/77 (01/11 0700) SpO2:  [91 %-100 %] 95 % (01/11 0700) FiO2 (%):  [45 %-100 %] 45 % (01/11 0400) Weight:  [169.8 kg-173.3 kg] 169.8 kg (01/11 0500) Last BM Date : 12/28/22  Intake/Output from previous day: 01/10 0701 - 01/11 0700 In: 4261.8 [P.O.:60; I.V.:2219.1; NG/GT:30; IV Piggyback:1952.7] Out: 3401 [Urine:2751; Emesis/NG output:350; Blood:100] Intake/Output this shift: No intake/output data recorded.  Ab vac in place with some bloody drainage, approp tender  Lab Results:  Recent Labs    01/01/23 0210 01/02/23 0328 01/02/23 1711  WBC 6.2 7.8  --   HGB 17.7* 18.4* 19.4*  HCT 55.4* 58.2* 57.0*  PLT 209 179  --    BMET Recent Labs    01/02/23 1245 01/02/23 1710 01/02/23 1711 01/03/23 0554  NA 137  --  134* 136  K 3.1*   < > 3.2* 3.8  CL 83*  --   --  87*  CO2 36*  --   --  33*  GLUCOSE 81  --   --  104*  BUN 46*  --   --  39*  CREATININE 1.69*  --   --  1.55*  CALCIUM 9.2  --   --  8.8*   < > = values in this interval not displayed.   PT/INR Recent Labs    12/31/22 1215  LABPROT 22.2*  INR 2.0*   ABG Recent Labs    01/02/23 1711  PHART 7.469*  HCO3 35.5*    Studies/Results: DG Abd Portable 1V  Result Date: 01/02/2023 CLINICAL DATA:  Small-bowel obstruction EXAM: PORTABLE ABDOMEN - 1 VIEW COMPARISON:  12/31/2022 FINDINGS: NG enters the stomach with the tip not visualized. Majority of the abdomen not included on the study. Bibasilar airspace disease left greater than right. IMPRESSION: NG enters the stomach with the tip not visualized. Majority of the abdomen not included on the study. Electronically Signed   By: Franchot Gallo M.D.   On: 01/02/2023 07:48   DG Abd  Portable 1V  Result Date: 01/02/2023 CLINICAL DATA:  Small-bowel obstruction EXAM: PORTABLE ABDOMEN - 1 VIEW COMPARISON:  01/02/2023, 12/31/2022 FINDINGS: NG tube in the proximal stomach. Mildly distended loops of colon also present. Moderate amount of stool in the right colon. Right hip replacement IMPRESSION: NG tube in the proximal stomach. Mildly distended colon. Electronically Signed   By: Franchot Gallo M.D.   On: 01/02/2023 07:47    Anti-infectives: Anti-infectives (From admission, onward)    Start     Dose/Rate Route Frequency Ordered Stop   01/03/23 0000  ciprofloxacin (CIPRO) IVPB 400 mg        400 mg 200 mL/hr over 60 Minutes Intravenous Every 12 hours 01/02/23 1649 01/03/23 2359   01/02/23 2200  metroNIDAZOLE (FLAGYL) IVPB 500 mg        500 mg 100 mL/hr over 60 Minutes Intravenous Every 8 hours 01/02/23 1649 01/03/23 2159   01/02/23 0915  ciprofloxacin (CIPRO) IVPB 400 mg        400 mg 200 mL/hr over 60 Minutes Intravenous On call to O.R. 01/02/23 0817 01/02/23 1431   01/02/23 0915  metroNIDAZOLE (FLAGYL) IVPB 500 mg        500 mg 100 mL/hr over  60 Minutes Intravenous On call to O.R. 01/02/23 7169 01/02/23 1508       Assessment/Plan: POD 1 elap/loa/sbr -expect prolonged ileus, continue ngt -would start TPN also -vac changes m/w/f -can restart heparin with no bolus today if cbc fine -abx for 24 hours periop then does not need more     Rolm Bookbinder 01/03/2023

## 2023-01-03 NOTE — Progress Notes (Signed)
Physical Therapy Treatment/Re-eval Patient Details Name: Timothy Macias MRN: [id number] DOB: 1961-04-03 Today's Date: 01/03/2023   History of Present Illness Pt is a 62 y.o. M who presents 12/30/2022 with SBO, afib/RVR, acute on chronic hypercapnea. Pt underwent cardioversion on 1/09. Underwent exploratory laparotomy for lysis of adhesions and small bowel resection on 1/10.  Significant PMH: Afib, HTN, previous SBO due to strangulated peri-umbilical hernia s/p ex-lap, OSA on CPAP.    PT Comments    Pt now s/p exp lap and bowel resection. Requiring more assist for all mobility as expected post op. Able to dangle EOB today but did not attempt standing due to fatigue and pain. Updated goals. Expect pt to make steady progress. Continue to recommend acute inpatient rehab (AIR) for post-acute therapy needs.    Recommendations for follow up therapy are one component of a multi-disciplinary discharge planning process, led by the attending physician.  Recommendations may be updated based on patient status, additional functional criteria and insurance authorization.  Follow Up Recommendations  Acute inpatient rehab (3hours/day)     Assistance Recommended at Discharge Frequent or constant Supervision/Assistance  Patient can return home with the following Assistance with cooking/housework;Help with stairs or ramp for entrance;Assist for transportation;Two people to help with walking and/or transfers;Two people to help with bathing/dressing/bathroom   Equipment Recommendations  None recommended by PT (pt well equipped)    Recommendations for Other Services Rehab consult     Precautions / Restrictions Precautions Precautions: Fall;Other (comment) Precaution Comments: NGT, watch HR Required Braces or Orthoses: Other Brace Other Brace: bilat AFO's Restrictions Weight Bearing Restrictions: No     Mobility  Bed Mobility Overal bed mobility: Needs Assistance Bed Mobility: Rolling, Sidelying to  Sit, Sit to Sidelying Rolling: Max assist Sidelying to sit: +2 for physical assistance, Max assist     Sit to sidelying: +2 for physical assistance, Max assist General bed mobility comments: Assist to bring hips and shoulders over. Assist to bring legs off of bed, elevate trunk into sitting, and bring hips to EOB. Assist to lower trunk and bring legs back up into bed returning to sidelying.    Transfers                   General transfer comment: Did not attempt    Ambulation/Gait                   Stairs             Wheelchair Mobility    Modified Rankin (Stroke Patients Only)       Balance Overall balance assessment: Needs assistance Sitting-balance support: Feet supported, No upper extremity supported Sitting balance-Leahy Scale: Fair                                      Cognition Arousal/Alertness: Awake/alert Behavior During Therapy: WFL for tasks assessed/performed Overall Cognitive Status: Within Functional Limits for tasks assessed                                          Exercises      General Comments General comments (skin integrity, edema, etc.): HR to 140's with mobility      Pertinent Vitals/Pain Pain Assessment Pain Assessment: Faces Faces Pain Scale: Hurts even more Pain Location: back and abdomen Pain Descriptors /  Indicators: Grimacing, Guarding Pain Intervention(s): Limited activity within patient's tolerance, Monitored during session, Repositioned    Home Living                          Prior Function            PT Goals (current goals can now be found in the care plan section) Acute Rehab PT Goals Patient Stated Goal: go home PT Goal Formulation: With patient Time For Goal Achievement: 01/17/23 Potential to Achieve Goals: Good Progress towards PT goals: Goals downgraded-see care plan    Frequency    Min 3X/week      PT Plan      Co-evaluation PT/OT/SLP  Co-Evaluation/Treatment: Yes Reason for Co-Treatment: For patient/therapist safety;Complexity of the patient's impairments (multi-system involvement)          AM-PAC PT "6 Clicks" Mobility   Outcome Measure  Help needed turning from your back to your side while in a flat bed without using bedrails?: A Lot Help needed moving from lying on your back to sitting on the side of a flat bed without using bedrails?: Total Help needed moving to and from a bed to a chair (including a wheelchair)?: Total Help needed standing up from a chair using your arms (e.g., wheelchair or bedside chair)?: Total Help needed to walk in hospital room?: Total Help needed climbing 3-5 steps with a railing? : Total 6 Click Score: 7    End of Session Equipment Utilized During Treatment: Oxygen Activity Tolerance: Patient limited by pain Patient left: with call bell/phone within reach;in bed;with family/visitor present Nurse Communication: Mobility status PT Visit Diagnosis: Unsteadiness on feet (R26.81);Difficulty in walking, not elsewhere classified (R26.2);Pain;Other abnormalities of gait and mobility (R26.89) Pain - part of body:  (abdomen and back)     Time: 2355-7322 PT Time Calculation (min) (ACUTE ONLY): 24 min  Charges:                        [place] [facility] [place] 01/03/2023, 3:41 PM

## 2023-01-04 ENCOUNTER — Encounter (HOSPITAL_COMMUNITY): Payer: Self-pay | Admitting: General Surgery

## 2023-01-04 DIAGNOSIS — J9622 Acute and chronic respiratory failure with hypercapnia: Secondary | ICD-10-CM | POA: Diagnosis not present

## 2023-01-04 DIAGNOSIS — I4891 Unspecified atrial fibrillation: Secondary | ICD-10-CM | POA: Diagnosis not present

## 2023-01-04 LAB — PHOSPHORUS: Phosphorus: 2.2 mg/dL — ABNORMAL LOW (ref 2.5–4.6)

## 2023-01-04 LAB — COMPREHENSIVE METABOLIC PANEL
ALT: 10 U/L (ref 0–44)
AST: 23 U/L (ref 15–41)
Albumin: 2.8 g/dL — ABNORMAL LOW (ref 3.5–5.0)
Alkaline Phosphatase: 47 U/L (ref 38–126)
Anion gap: 12 (ref 5–15)
BUN: 30 mg/dL — ABNORMAL HIGH (ref 8–23)
CO2: 34 mmol/L — ABNORMAL HIGH (ref 22–32)
Calcium: 8.6 mg/dL — ABNORMAL LOW (ref 8.9–10.3)
Chloride: 89 mmol/L — ABNORMAL LOW (ref 98–111)
Creatinine, Ser: 1.48 mg/dL — ABNORMAL HIGH (ref 0.61–1.24)
GFR, Estimated: 53 mL/min — ABNORMAL LOW (ref >60)
Glucose, Bld: 84 mg/dL (ref 70–99)
Potassium: 3.2 mmol/L — ABNORMAL LOW (ref 3.5–5.1)
Sodium: 135 mmol/L (ref 135–145)
Total Bilirubin: 1 mg/dL (ref 0.3–1.2)
Total Protein: 5.6 g/dL — ABNORMAL LOW (ref 6.5–8.1)

## 2023-01-04 LAB — CBC
HCT: 52.1 % — ABNORMAL HIGH (ref 39.0–52.0)
Hemoglobin: 17.1 g/dL — ABNORMAL HIGH (ref 13.0–17.0)
MCH: 26.6 pg (ref 26.0–34.0)
MCHC: 32.8 g/dL (ref 30.0–36.0)
MCV: 81 fL (ref 80.0–100.0)
Platelets: 277 10*3/uL (ref 150–400)
RBC: 6.43 MIL/uL — ABNORMAL HIGH (ref 4.22–5.81)
RDW: 19.9 % — ABNORMAL HIGH (ref 11.5–15.5)
WBC: 17.4 10*3/uL — ABNORMAL HIGH (ref 4.0–10.5)
nRBC: 0.2 % (ref 0.0–0.2)

## 2023-01-04 LAB — MAGNESIUM: Magnesium: 2 mg/dL (ref 1.7–2.4)

## 2023-01-04 LAB — GLUCOSE, CAPILLARY
Glucose-Capillary: 101 mg/dL — ABNORMAL HIGH (ref 70–99)
Glucose-Capillary: 103 mg/dL — ABNORMAL HIGH (ref 70–99)
Glucose-Capillary: 58 mg/dL — ABNORMAL LOW (ref 70–99)
Glucose-Capillary: 76 mg/dL (ref 70–99)
Glucose-Capillary: 85 mg/dL (ref 70–99)
Glucose-Capillary: 92 mg/dL (ref 70–99)
Glucose-Capillary: 93 mg/dL (ref 70–99)

## 2023-01-04 LAB — APTT: aPTT: 48 seconds — ABNORMAL HIGH (ref 24–36)

## 2023-01-04 LAB — SURGICAL PATHOLOGY

## 2023-01-04 LAB — HEPARIN LEVEL (UNFRACTIONATED): Heparin Unfractionated: 0.66 IU/mL (ref 0.30–0.70)

## 2023-01-04 MED ORDER — PROSOURCE PLUS PO LIQD
30.0000 mL | Freq: Three times a day (TID) | ORAL | Status: DC
  Administered 2023-01-04 – 2023-01-08 (×8): 30 mL via ORAL
  Filled 2023-01-04 (×9): qty 30

## 2023-01-04 MED ORDER — HEPARIN SODIUM (PORCINE) 5000 UNIT/ML IJ SOLN
5000.0000 [IU] | Freq: Three times a day (TID) | INTRAMUSCULAR | Status: DC
  Administered 2023-01-04 – 2023-01-08 (×12): 5000 [IU] via SUBCUTANEOUS
  Filled 2023-01-04 (×12): qty 1

## 2023-01-04 MED ORDER — LEVALBUTEROL HCL 0.63 MG/3ML IN NEBU: 0.6300 mg | INHALATION_SOLUTION | Freq: Four times a day (QID) | RESPIRATORY_TRACT | Status: DC | PRN

## 2023-01-04 MED ORDER — POTASSIUM CHLORIDE 20 MEQ PO PACK
40.0000 meq | PACK | Freq: Two times a day (BID) | ORAL | Status: AC
  Administered 2023-01-04 – 2023-01-05 (×3): 40 meq via ORAL
  Filled 2023-01-04 (×3): qty 2

## 2023-01-04 MED ORDER — ORAL CARE MOUTH RINSE: 15.0000 mL | OROMUCOSAL | Status: DC | PRN

## 2023-01-04 MED ORDER — METOPROLOL SUCCINATE ER 50 MG PO TB24
75.0000 mg | ORAL_TABLET | Freq: Every day | ORAL | Status: DC
  Administered 2023-01-04: 75 mg via ORAL
  Filled 2023-01-04: qty 1

## 2023-01-04 MED ORDER — DEXTROSE 50 % IV SOLN
INTRAVENOUS | Status: AC
  Administered 2023-01-04: 50 mL
  Filled 2023-01-04: qty 50

## 2023-01-04 MED ORDER — AMIODARONE HCL 200 MG PO TABS
400.0000 mg | ORAL_TABLET | Freq: Every day | ORAL | Status: DC
  Administered 2023-01-04 – 2023-01-07 (×4): 400 mg via ORAL
  Filled 2023-01-04 (×4): qty 2

## 2023-01-04 MED ORDER — DEXTROSE 50 % IV SOLN
12.5000 g | INTRAVENOUS | Status: AC
  Administered 2023-01-04: 12.5 g via INTRAVENOUS

## 2023-01-04 MED ORDER — SIMETHICONE 80 MG PO CHEW
80.0000 mg | CHEWABLE_TABLET | Freq: Four times a day (QID) | ORAL | Status: DC | PRN
  Administered 2023-01-04 – 2023-01-08 (×6): 80 mg via ORAL
  Filled 2023-01-04 (×6): qty 1

## 2023-01-04 NOTE — Progress Notes (Addendum)
[place] Progress Note Patient Name: Timothy Macias DOB: 11-16-1961 MRN: [id number]   Date of Service  01/04/2023  HPI/Events of Note  Wound VAC recording blockage alert. Blood bulging from under dressing. Surgery requesting Heparin IV infusion held until surgeon can change dressing. Patient appears to be on Heparin for AFIB.  eICU Interventions  Plan: Hold Heparin IV infusion.      Intervention Category Major Interventions: Other:  Lysle Dingwall 01/04/2023, 4:42 AM

## 2023-01-04 NOTE — Progress Notes (Signed)
Patient just had a large bloody, liquid bowel movement. General surgery paged and made aware.

## 2023-01-04 NOTE — Progress Notes (Signed)
Wound vac alerting blockage, dressing bulging with blood underneath and increased output in the last few hours. Surgery paged. Awaiting response

## 2023-01-04 NOTE — TOC Progression Note (Signed)
Transition of Care [facility]) - Progression Note    Patient Details  Name: Timothy Macias MRN: [id number] Date of Birth: 11/28/1961  [facility] CM/SW Contact  Erenest Rasher, RN Phone Number:[phone] 01/04/2023, 2:03 PM  Clinical Narrative:     CM will place [employer] wound vac order on chart for MD to sign. PT recommending IP rehab, CIR following referral. Will continue to follow for dc needs.    Expected Discharge Plan: IP Rehab Facility Barriers to Discharge: Continued Medical Work up  Expected Discharge Plan and Services   Discharge Planning Services: CM Consult Post Acute Care Choice: [place] arrangements for the past 2 months: Single Family Home                                       Social Determinants of Health (SDOH) Interventions SDOH Screenings   Tobacco Use: Medium Risk (01/04/2023)    Readmission Risk Interventions     No data to display

## 2023-01-04 NOTE — Progress Notes (Signed)
Brief Nutrition Follow-up:  TPN initiation held today as pt starting to have bowel function; +2 BMs, +gas pains  Remains NPO but with sips of CL from floor stock. NG with minimal output and was discontinued  Wound VAC bleeding and flow blocked by large clot; wound VAC removed, small stitch placed, wet to dry dressing placed, hepartin held  Interventions:  -30 ml ProSource Plus TID, each supplement provides 100 kcals and 15 grams protein. Very small volume (1 ounce=30 mL) but provides good amount of protein.  -If unable to advance diet past CL and/or pt not tolerating enteral diet, recommend TPN initiation   Kerman Passey MS, RDN, LDN, CNSC Registered Dietitian 3 Clinical Nutrition RD Pager and On-Call Pager Number Located in [place]

## 2023-01-04 NOTE — Progress Notes (Signed)
Pt placed on bipap for the night and seems to be tolerating it well at this time.  

## 2023-01-04 NOTE — Progress Notes (Signed)
Rounding Note    Patient Name: Timothy Macias Date of Encounter: 01/04/2023  [place] Cardiologist: Quentin Ore and [facility]  Subjective   NG tube out taking PO ice chips   Inpatient Medications    Scheduled Meds:  amiodarone  400 mg Oral Daily   Chlorhexidine Gluconate Cloth  6 each Topical Daily   heparin injection (subcutaneous)  5,000 Units Subcutaneous Q8H   insulin aspart  1-3 Units Subcutaneous Q4H   metoprolol succinate  75 mg Oral Daily   pantoprazole (PROTONIX) IV  40 mg Intravenous Q24H   revefenacin  175 mcg Nebulization Daily   tamsulosin  0.4 mg Oral Daily   Continuous Infusions:  amiodarone 60 mg/hr (01/04/23 0700)   PRN Meds: artificial tears, fentaNYL (SUBLIMAZE) injection, levalbuterol, menthol-cetylpyridinium, metoprolol tartrate, ondansetron (ZOFRAN) IV, mouth rinse, phenol   Vital Signs    Vitals:   01/04/23 0733 01/04/23 0800 01/04/23 0832 01/04/23 0900  BP:  (!) 156/85  (!) 161/86  Pulse:  (!) 101 84 93  Resp:  15 14 19   Temp: 98.2 F (36.8 C)     TempSrc: Oral     SpO2:  96% 97% 98%  Weight:      Height:        Intake/Output Summary (Last 24 hours) at 01/04/2023 0923 Last data filed at 01/04/2023 0700 Gross per 24 hour  Intake 1952.6 ml  Output 2190 ml  Net -237.4 ml      01/03/2023    5:00 AM 01/02/2023    1:43 PM 01/02/2023    5:00 AM  Last 3 Weights  Weight (lbs) 374 lb 5.5 oz 382 lb 367 lb 1.1 oz  Weight (kg) 169.8 kg 173.274 kg 166.5 kg      Telemetry    AF w/ RVR, rates 90-100  - Personally Reviewed  ECG    AF w/ RVR. RBBB. LAFB - Personally Reviewed  Physical Exam   Super morbid obesity NG tube draining Post surgery with wound vac looks good  Lungs clear anteriorly Distant heart sounds Plus 1-2 edema  Labs    High Sensitivity Troponin:   Recent Labs  Lab 12/28/22 1400 12/28/22 1620 12/29/22 1556 12/29/22 1713  TROPONINIHS 9 8 7 7      Chemistry Recent Labs  Lab 12/28/22 1335  12/29/22 0552 12/30/22 1512 12/30/22 2105 12/31/22 0259 01/01/23 0210 01/02/23 0328 01/02/23 1245 01/02/23 1710 01/02/23 1711 01/03/23 0554 01/04/23 0656  NA 133*   < > 131*   < > 134*   < > 136 137  --  134* 136 135  K 3.5   < > 3.0*   < > 4.2   < > 2.8* 3.1*   < > 3.2* 3.8 3.2*  CL 90*   < > 86*   < > 90*   < > 83* 83*  --   --  87* 89*  CO2 29   < > 30   < > 22   < > 35* 36*  --   --  33* 34*  GLUCOSE 112*   < > 114*   < > 90   < > 80 81  --   --  104* 84  BUN 47*   < > 60*   < > 63*   < > 49* 46*  --   --  39* 30*  CREATININE 2.40*   < > 2.46*   < > 2.29*   < > 1.75* 1.69*  --   --  1.55* 1.48*  CALCIUM 8.9   < > 8.3*   < > 8.1*   < > 8.9 9.2  --   --  8.8* 8.6*  MG  --   --  2.1   < >  --    < > 2.2  --   --   --  2.2 2.0  PROT 7.4  --  6.7  --   --   --   --   --   --   --   --  5.6*  ALBUMIN 3.7  --  3.2*  --  3.1*  --   --   --   --   --   --  2.8*  AST 19  --  22  --   --   --   --   --   --   --   --  23  ALT 14  --  13  --   --   --   --   --   --   --   --  10  ALKPHOS 73  --  51  --   --   --   --   --   --   --   --  47  BILITOT 1.3*  --  1.2  --   --   --   --   --   --   --   --  1.0  GFRNONAA 30*   < > 29*   < > 32*   < > 44* 46*  --   --  51* 53*  ANIONGAP 14   < > 15   < > 22*   < > 18* 18*  --   --  16* 12   < > = values in this interval not displayed.    Lipids  Recent Labs  Lab 01/03/23 0554  TRIG 127    Hematology Recent Labs  Lab 01/02/23 0328 01/02/23 1711 01/03/23 0554 01/04/23 0423  WBC 7.8  --  13.9* 17.4*  RBC 7.09*  --  7.04* 6.43*  HGB 18.4* 19.4* 18.1* 17.1*  HCT 58.2* 57.0* 56.7* 52.1*  MCV 82.1  --  80.5 81.0  MCH 26.0  --  25.7* 26.6  MCHC 31.6  --  31.9 32.8  RDW 19.9*  --  19.9* 19.9*  PLT 179  --  204 277   Thyroid  Recent Labs  Lab 12/30/22 1450  TSH 0.657    BNP Recent Labs  Lab 12/30/22 1306 12/31/22 0259 01/01/23 0210  BNP 173.4* 72.7 82.1    DDimer  Recent Labs  Lab 12/30/22 1512  DDIMER 3.47*      Radiology    [place] EKG SITE RITE  Result Date: 01/03/2023 If Site Rite image not attached, placement could not be confirmed due to current cardiac rhythm.     Assessment & Plan    #Chronic AF On eliquis post surgery Continue amiodarone Oral lopressor started ok per surgery  Rates improved [facility] 01/01/23 with ERAF no plans to repeat this admission   #Chronic diastolic heart failure NYHA III-IV. Symptoms related to HF and weight. Daily weights. Keep K>4, Mg>2 Add lasix back after taking PO better Home dose was 80 mg daily    #Morbid obesity  #Abdominal pain/nausea Post bowel resection  Taking ice chips  Wound vac No antibiotic coverage now      For questions or updates, please contact [facility] Please consult  [email or web address] for contact info under        Signed, Jenkins Rouge, MD  01/04/2023, 9:23 AM

## 2023-01-04 NOTE — Progress Notes (Signed)
This is a progress note    NAME:  Brooke Payes, MRN:  [id number], DOB:  Mar 21, 1961, LOS: 7 ADMISSION DATE:  12/28/2022, CONSULTATION DATE:  1/7 REFERRING MD:  Loney Hering, CHIEF COMPLAINT:  encephalopathy   History of Present Illness:  Mr. Klas is a 62 y/o gentleman with a history of Afib, HTN, previous SBO due to strangulated peri-umbilical hernia s/p ex-lap, OSA on CPAP who presented with 3 days of nausea, abdominal pain, and distention on 1/6. He began having nausea and vomiting in the ED. His vomiting was described as brown.  He has been feeling poorly overall since a few days before Christmas.  He had worse edema for the past few weeks and was treated by his PCP with escalating doses of diuretics, which she did not feel were beneficial.  He has not recently had constipation, he was still having bowel movements prior to his abdominal symptoms starting.  Today he has had uncontrolled A-fib with RVR and is now on amiodarone infusion.  The rapid response nurse has had to check on him multiple times throughout the day.  He had a KUB demonstrating a bowel obstruction.  NG tube was placed with rapid removal of about 3 L of gastric fluid.  He has become progressively more somnolent throughout the day.  He has OSA and is compliant with CPAP at home.  Last dose of Eliquis was this morning.  Pertinent  Medical History  Hernia surgery- ex lap with bowel resection and primary anastamosis OSA NASH Afib HTN HFpEF, mild PH Peripheral neuropathy on Lyrica; unsure etiology of neuropathy GERD Remote tobacco abuse-quit 10 years ago, multiple packs per day  Significant Hospital Events: Including procedures, antibiotic start and stop dates in addition to other pertinent events   1/6 admitted 1/7 amiodarone infusion, IVF, NGT placed to decompress abdomen. Transferring to ICU. 1/9 DCCV failed due to ongoing bowel obstruction. 1/10 underwent laparotomy with lysis of adhesions  Interim History /  Subjective:  Successfully extubated yesterday.  Bled from wound after heparin was restarted.  Objective   Blood pressure (!) 161/86, pulse 93, temperature 98.7 F (37.1 C), temperature source Axillary, resp. rate 19, height 5\' 8"  (1.727 m), weight (!) 169.8 kg, SpO2 98 %.    FiO2 (%):  [40 %] 40 %   Intake/Output Summary (Last 24 hours) at 01/04/2023 1230 Last data filed at 01/04/2023 0700 Gross per 24 hour  Intake 1344.3 ml  Output 2190 ml  Net -845.7 ml    Filed Weights   01/02/23 0500 01/02/23 1343 01/03/23 0500  Weight: (!) 166.5 kg (!) 173.3 kg (!) 169.8 kg    Examination: General: Obese man in no distress HEENT: Nasogastric tube in place with minimal drainage Pulmonary: Chest clear to auscultation bilaterally, no wheezing.   Cardiac: Heart sounds are unremarkable.  Remains in atrial fibrillation Abdomen: Protuberant, lower midline incision with wet-to-dry dressing in place.  Bowel sounds present. GU Foley catheter in place with amber urine.  Ancillary tests personally reviewed  Mild leukocytosis 17.4 Elevated hemoglobin 17.1 (hemoconcentration) Improving creatinine 1.48 Assessment & Plan:  SBO status post laparotomy and lysis of adhesions.  Delayed bowel function Acute on chronic hypoxic hypercapnic respiratory failure (OHS/OSA)-polycythemia may be the result of chronic hypoxia versus hemoconcentration.  If latter will improve with progressive rehydration. Class 3 obesity Afib/RVR- ongoing issue AKI on CKD- with volume overload NASH HTN  -Hold heparin for now.  Transition back to home atrial fibrillation medication -Switch bronchodilators to as needed to limit tachycardia -Advancing  to clear diet per general surgery - Continue BIPAP qHS and PRN -Minimize narcotic use to encourage resumption of bowel function.  Best Practice (right click and "Reselect all SmartList Selections" daily)   Diet/type: Sips of clear fluids today DVT prophylaxis: Prophylactic  subcutaneous heparin GI prophylaxis: Protonix Lines: Left IJ central line, remove arterial line Foley: External catheter Code Status:  full code Last date of multidisciplinary goals of care discussion [wife and patient updated 1/7]  Kipp Brood, MD [facility] ICU Physician [place]  Pager: [phone] Mobile: [phone] After hours: [phone].

## 2023-01-04 NOTE — Progress Notes (Signed)
PHARMACY - TOTAL PARENTERAL NUTRITION CONSULT NOTE  Indication: Prolonged ileus  Patient Measurements: Height: 5\' 8"  (172.7 cm) Weight: (!) 169.8 kg (374 lb 5.5 oz) IBW/kg (Calculated) : 68.4 TPN AdjBW (KG): 94.6 Body mass index is 56.92 kg/m. Usual Weight: 143.8 kg in May 2023  Assessment:  9 YOM presented on 1/5 with SOB.  Patient reports a 43 lb weight gain from fluid retention.  PMH significant for HF, OSA, Afib and umbilical hernia s/p ex-lap.  Found to have SBO and treated conservatively without improvement.  Underwent ex-lap with LoA and SBR on 1/10.  Patient has been NPO since admission and Pharmacy consulted to manage TPN for expected prolonged ileus.    Glucose / Insulin: no hx DM - CBGs low normal Used 2 units of SSI in the past 24 hrs Electrolytes: labs pending Renal: labs pending Hepatic: LFTs / tbili / TG WNL Intake / Output; MIVF: UOP 0.5 ml/kg/hr, NG 459mL, drain 32mL, LBM 1/11 GI Imaging:  GI Surgeries / Procedures:   Central access:  TPN start date:   Nutritional Goals: RD Estimated Needs Total Energy Estimated Needs: 2000-2200 kcals Total Protein Estimated Needs: 140-160 g Total Fluid Estimated Needs: 2L  Current Nutrition:  NPO  Plan:  D/C TPN consult per CCM D/C TPN labs and nursing care orders  Matix Henshaw D. Mina Marble, PharmD, BCPS, [place] 01/04/2023, 8:20 AM

## 2023-01-04 NOTE — Progress Notes (Signed)
Physical Therapy Treatment Patient Details Name: Timothy Macias MRN: [id number] DOB: 03-Jun-1961 Today's Date: 01/04/2023   History of Present Illness Pt is a 62 y.o. M who presents 12/30/2022 with SBO, afib/RVR, acute on chronic hypercapnea. Pt underwent cardioversion on 1/09. Underwent exploratory laparotomy for lysis of adhesions and small bowel resection on 1/10.  Significant PMH: Afib, HTN, previous SBO due to strangulated peri-umbilical hernia s/p ex-lap, OSA on CPAP.    PT Comments    Pt with slow progress. Unable to stand from bedside (hindered by height of bed and width of Stedy). Tolerated transfer to chair via maxisky. Likely standing from chair might need to be where to start with standing.    Recommendations for follow up therapy are one component of a multi-disciplinary discharge planning process, led by the attending physician.  Recommendations may be updated based on patient status, additional functional criteria and insurance authorization.  Follow Up Recommendations  Acute inpatient rehab (3hours/day)     Assistance Recommended at Discharge Frequent or constant Supervision/Assistance  Patient can return home with the following Assistance with cooking/housework;Help with stairs or ramp for entrance;Assist for transportation;Two people to help with walking and/or transfers;Two people to help with bathing/dressing/bathroom   Equipment Recommendations  None recommended by PT (pt well equipped)    Recommendations for Other Services Rehab consult     Precautions / Restrictions Precautions Precautions: Fall;Other (comment) Precaution Comments: watch HR Required Braces or Orthoses: Other Brace Other Brace: bilat AFO's Restrictions Weight Bearing Restrictions: No     Mobility  Bed Mobility Overal bed mobility: Needs Assistance Bed Mobility: Rolling, Sidelying to Sit, Sit to Sidelying Rolling: Max assist, +2 for physical assistance Sidelying to sit: +2 for physical  assistance, Max assist     Sit to sidelying: +2 for physical assistance, Max assist General bed mobility comments: Assist to bring hips and shoulders over. Assist to bring legs off of bed, elevate trunk into sitting, and bring hips to EOB. Assist to lower trunk and bring legs back up into bed returning to sidelying.    Transfers                   General transfer comment: Attempted to stand from bed with Stedy with +2 assist but unable. Body habitus too wide for Stedy. Transfer via Chiropractor [facility] (Stroke Patients Only)       Balance Overall balance assessment: Needs assistance Sitting-balance support: Feet supported, No upper extremity supported Sitting balance-Leahy Scale: Fair                                      Cognition Arousal/Alertness: Awake/alert Behavior During Therapy: WFL for tasks assessed/performed Overall Cognitive Status: Within Functional Limits for tasks assessed                                          Exercises      General Comments General comments (skin integrity, edema, etc.): HR to 130's with activity      Pertinent Vitals/Pain Pain Assessment Pain Assessment: Faces Faces Pain Scale: Hurts even more Pain  Location: abdomen Pain Descriptors / Indicators: Grimacing, Guarding Pain Intervention(s): Monitored during session, Limited activity within patient's tolerance, Repositioned    Home Living                          Prior Function            PT Goals (current goals can now be found in the care plan section) Acute Rehab PT Goals Patient Stated Goal: go home Progress towards PT goals: Progressing toward goals    Frequency    Min 3X/week      PT Plan      Co-evaluation PT/OT/SLP Co-Evaluation/Treatment: Yes            AM-PAC PT "6 Clicks" Mobility    Outcome Measure  Help needed turning from your back to your side while in a flat bed without using bedrails?: Total Help needed moving from lying on your back to sitting on the side of a flat bed without using bedrails?: Total Help needed moving to and from a bed to a chair (including a wheelchair)?: Total Help needed standing up from a chair using your arms (e.g., wheelchair or bedside chair)?: Total Help needed to walk in hospital room?: Total Help needed climbing 3-5 steps with a railing? : Total 6 Click Score: 6    End of Session Equipment Utilized During Treatment: Oxygen Activity Tolerance: Patient limited by pain Patient left: in chair;with chair alarm set;with call bell/phone within reach Nurse Communication: Mobility status;Need for lift equipment PT Visit Diagnosis: Unsteadiness on feet (R26.81);Difficulty in walking, not elsewhere classified (R26.2);Pain;Other abnormalities of gait and mobility (R26.89) Pain - part of body:  (abdomen)     Time: 2202-5427 PT Time Calculation (min) (ACUTE ONLY): 30 min  Charges:  $Therapeutic Activity: 23-37 mins                     [place] 01/04/2023, 1:15 PM

## 2023-01-04 NOTE — Progress Notes (Signed)
Per surgery, heparin infusion to be held until dressing change today. Per surgery request [facility] notified of need to hold heparin. Awaiting response from [facility] MD.

## 2023-01-04 NOTE — Progress Notes (Signed)
Inpatient Rehab Admissions Coordinator:   Note plans to d/c NGT today and start diet.  Will see how he does over the weekend and f/u with pt on Monday.   Shann Medal, PT, DPT Admissions Coordinator [phone] 01/04/23  8:34 AM

## 2023-01-04 NOTE — Progress Notes (Signed)
Upon assessment after report patients blood sugar is 58 and abdominal wound vac draining copious amounts of red blood from the site. Patient asymptotic and hemodynamically stable.  Night shift RN gave D5 through IV, this RN paged general surgery to come to bedside. Wound vac removed by general surgery and wet to dry dressing placed. Heparin gtt was stopped at 0438 by night shift RN. Per general surgery, leave heparin gtt off for now.

## 2023-01-04 NOTE — Progress Notes (Signed)
2 Days Post-Op   Subjective/Chief Complaint: Extubated, had BM, having gas pains in abdomen.  Wound VAC with bleeding and blocked secondary to large clot.   Objective: Vital signs in last 24 hours: Temp:  [97.7 F (36.5 C)-98.3 F (36.8 C)] 98.2 F (36.8 C) (01/12 0733) Pulse Rate:  [78-116] 87 (01/12 0700) Resp:  [14-26] 18 (01/12 0700) BP: (109-166)/(69-133) 150/89 (01/12 0700) SpO2:  [92 %-100 %] 93 % (01/12 0700) FiO2 (%):  [40 %] 40 % (01/12 0031) Last BM Date : 01/03/23  Intake/Output from previous day: 01/11 0701 - 01/12 0700 In: 1952.6 [I.V.:1093.6; IV Piggyback:859] Out: 2190 [Urine:1700; Emesis/NG output:100; Drains:390] Intake/Output this shift: No intake/output data recorded.  Abd: obese, appropriately tender, VAC removed and bleeding vessel at skin edge noted.  A 2-0 vicry stitch was used to make a figure of 8 stitch.  This resolved any further bleeding.  WD dressing was placed in the wound.  NGT with  minimal output.  Lab Results:  Recent Labs    01/03/23 0554 01/04/23 0423  WBC 13.9* 17.4*  HGB 18.1* 17.1*  HCT 56.7* 52.1*  PLT 204 277   BMET Recent Labs    01/02/23 1245 01/02/23 1710 01/02/23 1711 01/03/23 0554  NA 137  --  134* 136  K 3.1*   < > 3.2* 3.8  CL 83*  --   --  87*  CO2 36*  --   --  33*  GLUCOSE 81  --   --  104*  BUN 46*  --   --  39*  CREATININE 1.69*  --   --  1.55*  CALCIUM 9.2  --   --  8.8*   < > = values in this interval not displayed.   PT/INR No results for input(s): "LABPROT", "INR" in the last 72 hours.  ABG Recent Labs    01/02/23 1711  PHART 7.469*  HCO3 35.5*    Studies/Results: [place] EKG SITE RITE  Result Date: 01/03/2023 If Site Rite image not attached, placement could not be confirmed due to current cardiac rhythm.   Anti-infectives: Anti-infectives (From admission, onward)    Start     Dose/Rate Route Frequency Ordered Stop   01/03/23 0000  ciprofloxacin (CIPRO) IVPB 400 mg        400 mg 200 mL/hr  over 60 Minutes Intravenous Every 12 hours 01/02/23 1649 01/03/23 1206   01/02/23 2200  metroNIDAZOLE (FLAGYL) IVPB 500 mg        500 mg 100 mL/hr over 60 Minutes Intravenous Every 8 hours 01/02/23 1649 01/03/23 1519   01/02/23 0915  ciprofloxacin (CIPRO) IVPB 400 mg        400 mg 200 mL/hr over 60 Minutes Intravenous On call to O.R. 01/02/23 0817 01/02/23 1431   01/02/23 0915  metroNIDAZOLE (FLAGYL) IVPB 500 mg        500 mg 100 mL/hr over 60 Minutes Intravenous On call to O.R. 01/02/23 0817 01/02/23 1508       Assessment/Plan: POD 2, s/p ex lap with LOA and SBR for SBO, Dr. Donne Hazel 1/10 -patient having bowel function today.  DC NGT and allow sips of clears from the floor -hold on TNA given bowel function and hope to progress diet at this point -DC wound VAC and hold heparin gtt.  D/w primary at the bedside -NS WD dressing changes to midline wound BID, likely to replace VAC on Monday -mobilize with nursing and PT -IS, pulm toilet    FEN - NPO  x ice/sips of clears VTE - heparin gtt on hold due to bleeding wound. Converting to just heparin SQ for prophylaxis ID - completed 24 hrs post op abx   Afib T. CHF HTN OSA NASH Acute on CKD  Henreitta Cea, PA-C 01/04/2023 Please see Amion for pager number during day hours 7:00am-4:30pm or 7:00am -11:30am on weekends

## 2023-01-05 DIAGNOSIS — J9622 Acute and chronic respiratory failure with hypercapnia: Secondary | ICD-10-CM | POA: Diagnosis not present

## 2023-01-05 DIAGNOSIS — I4891 Unspecified atrial fibrillation: Secondary | ICD-10-CM | POA: Diagnosis not present

## 2023-01-05 LAB — CBC
HCT: 48.3 % (ref 39.0–52.0)
Hemoglobin: 15.4 g/dL (ref 13.0–17.0)
MCH: 26.2 pg (ref 26.0–34.0)
MCHC: 31.9 g/dL (ref 30.0–36.0)
MCV: 82.3 fL (ref 80.0–100.0)
Platelets: 195 10*3/uL (ref 150–400)
RBC: 5.87 MIL/uL — ABNORMAL HIGH (ref 4.22–5.81)
RDW: 19.4 % — ABNORMAL HIGH (ref 11.5–15.5)
WBC: 15.1 10*3/uL — ABNORMAL HIGH (ref 4.0–10.5)
nRBC: 0 % (ref 0.0–0.2)

## 2023-01-05 LAB — BASIC METABOLIC PANEL
Anion gap: 18 — ABNORMAL HIGH (ref 5–15)
BUN: 37 mg/dL — ABNORMAL HIGH (ref 8–23)
CO2: 29 mmol/L (ref 22–32)
Calcium: 8.7 mg/dL — ABNORMAL LOW (ref 8.9–10.3)
Chloride: 91 mmol/L — ABNORMAL LOW (ref 98–111)
Creatinine, Ser: 1.43 mg/dL — ABNORMAL HIGH (ref 0.61–1.24)
GFR, Estimated: 56 mL/min — ABNORMAL LOW (ref >60)
Glucose, Bld: 83 mg/dL (ref 70–99)
Potassium: 3.3 mmol/L — ABNORMAL LOW (ref 3.5–5.1)
Sodium: 138 mmol/L (ref 135–145)

## 2023-01-05 LAB — GLUCOSE, CAPILLARY
Glucose-Capillary: 71 mg/dL (ref 70–99)
Glucose-Capillary: 89 mg/dL (ref 70–99)
Glucose-Capillary: 106 mg/dL — ABNORMAL HIGH (ref 70–99)
Glucose-Capillary: 90 mg/dL (ref 70–99)
Glucose-Capillary: 81 mg/dL (ref 70–99)

## 2023-01-05 LAB — MAGNESIUM: Magnesium: 2 mg/dL (ref 1.7–2.4)

## 2023-01-05 MED ORDER — BOOST / RESOURCE BREEZE PO LIQD CUSTOM
1.0000 | Freq: Three times a day (TID) | ORAL | Status: DC
  Administered 2023-01-05: 1 via ORAL

## 2023-01-05 MED ORDER — METOPROLOL SUCCINATE ER 100 MG PO TB24
100.0000 mg | ORAL_TABLET | Freq: Every day | ORAL | Status: DC
  Administered 2023-01-05 – 2023-01-08 (×4): 100 mg via ORAL
  Filled 2023-01-05 (×2): qty 1
  Filled 2023-01-05 (×2): qty 2

## 2023-01-05 MED ORDER — ZINC OXIDE 40 % EX OINT
TOPICAL_OINTMENT | CUTANEOUS | Status: DC | PRN
  Filled 2023-01-05: qty 57

## 2023-01-05 MED ORDER — TRAMADOL-ACETAMINOPHEN 37.5-325 MG PO TABS
1.0000 | ORAL_TABLET | Freq: Four times a day (QID) | ORAL | Status: DC | PRN
  Administered 2023-01-05 – 2023-01-06 (×4): 2 via ORAL
  Filled 2023-01-05 (×4): qty 2

## 2023-01-05 MED ORDER — TRAMADOL-ACETAMINOPHEN 37.5-325 MG PO TABS: 1.0000 | ORAL_TABLET | Freq: Four times a day (QID) | ORAL | Status: DC | PRN

## 2023-01-05 NOTE — Progress Notes (Signed)
Patient having frequent stools approx every 45 mins.  Patient became emotional and requested "something to stop it".   Notified Dr. Lynetta Mare and Dr. Rosendo Gros.  Desitin ordered to help with skin irritation.

## 2023-01-05 NOTE — Progress Notes (Signed)
Rounding Note    Patient Name: Timothy Macias Date of Encounter: 01/05/2023  [place] Cardiologist: Quentin Ore and [facility]  Subjective   NG tube out taking PO ice chips and lemon ice   Inpatient Medications    Scheduled Meds:  (feeding supplement) PROSource Plus  30 mL Oral TID BM   amiodarone  400 mg Oral Daily   Chlorhexidine Gluconate Cloth  6 each Topical Daily   feeding supplement  1 Container Oral TID BM   heparin injection (subcutaneous)  5,000 Units Subcutaneous Q8H   insulin aspart  1-3 Units Subcutaneous Q4H   metoprolol succinate  100 mg Oral Daily   pantoprazole (PROTONIX) IV  40 mg Intravenous Q24H   potassium chloride  40 mEq Oral BID   revefenacin  175 mcg Nebulization Daily   tamsulosin  0.4 mg Oral Daily   Continuous Infusions:   PRN Meds: artificial tears, fentaNYL (SUBLIMAZE) injection, levalbuterol, menthol-cetylpyridinium, metoprolol tartrate, ondansetron (ZOFRAN) IV, mouth rinse, phenol, simethicone, traMADol-acetaminophen   Vital Signs    Vitals:   01/05/23 0700 01/05/23 0736 01/05/23 0754 01/05/23 0802  BP: (!) 137/113     Pulse: 92     Resp: 18     Temp:   98 F (36.7 C)   TempSrc:   Oral   SpO2: 98% 99%  93%  Weight:      Height:        Intake/Output Summary (Last 24 hours) at 01/05/2023 0844 Last data filed at 01/05/2023 0600 Gross per 24 hour  Intake 529.52 ml  Output 1925 ml  Net -1395.48 ml      01/05/2023    3:51 AM 01/04/2023    9:00 PM 01/03/2023    5:00 AM  Last 3 Weights  Weight (lbs) 371 lb 0.6 oz 371 lb 0.6 oz 374 lb 5.5 oz  Weight (kg) 168.3 kg 168.3 kg 169.8 kg      Telemetry    AF w/ RVR, rates 90-100  - Personally Reviewed  ECG    AF w/ RVR. RBBB. LAFB - Personally Reviewed  Physical Exam   Super morbid obesity NG tube draining Post surgery with wound vac looks good  Lungs clear anteriorly Distant heart sounds Plus 1-2 edema  Labs    High Sensitivity Troponin:   Recent Labs  Lab  12/28/22 1400 12/28/22 1620 12/29/22 1556 12/29/22 1713  TROPONINIHS 9 8 7 7      Chemistry Recent Labs  Lab 12/30/22 1512 12/30/22 2105 12/31/22 0259 01/01/23 0210 01/02/23 1245 01/02/23 1710 01/02/23 1711 01/03/23 0554 01/04/23 0656 01/05/23 0433  NA 131*   < > 134*   < > 137  --  134* 136 135  --   K 3.0*   < > 4.2   < > 3.1*   < > 3.2* 3.8 3.2*  --   CL 86*   < > 90*   < > 83*  --   --  87* 89*  --   CO2 30   < > 22   < > 36*  --   --  33* 34*  --   GLUCOSE 114*   < > 90   < > 81  --   --  104* 84  --   BUN 60*   < > 63*   < > 46*  --   --  39* 30*  --   CREATININE 2.46*   < > 2.29*   < > 1.69*  --   --  1.55* 1.48*  --   CALCIUM 8.3*   < > 8.1*   < > 9.2  --   --  8.8* 8.6*  --   MG 2.1   < >  --    < >  --   --   --  2.2 2.0 2.0  PROT 6.7  --   --   --   --   --   --   --  5.6*  --   ALBUMIN 3.2*  --  3.1*  --   --   --   --   --  2.8*  --   AST 22  --   --   --   --   --   --   --  23  --   ALT 13  --   --   --   --   --   --   --  10  --   ALKPHOS 51  --   --   --   --   --   --   --  47  --   BILITOT 1.2  --   --   --   --   --   --   --  1.0  --   GFRNONAA 29*   < > 32*   < > 46*  --   --  51* 53*  --   ANIONGAP 15   < > 22*   < > 18*  --   --  16* 12  --    < > = values in this interval not displayed.    Lipids  Recent Labs  Lab 01/03/23 0554  TRIG 127    Hematology Recent Labs  Lab 01/03/23 0554 01/04/23 0423 01/05/23 0433  WBC 13.9* 17.4* 15.1*  RBC 7.04* 6.43* 5.87*  HGB 18.1* 17.1* 15.4  HCT 56.7* 52.1* 48.3  MCV 80.5 81.0 82.3  MCH 25.7* 26.6 26.2  MCHC 31.9 32.8 31.9  RDW 19.9* 19.9* 19.4*  PLT 204 277 195   Thyroid  Recent Labs  Lab 12/30/22 1450  TSH 0.657    BNP Recent Labs  Lab 12/30/22 1306 12/31/22 0259 01/01/23 0210  BNP 173.4* 72.7 82.1    DDimer  Recent Labs  Lab 12/30/22 1512  DDIMER 3.47*     Radiology    [place] EKG SITE RITE  Result Date: 01/03/2023 If Site Rite image not attached, placement could not be  confirmed due to current cardiac rhythm.     Assessment & Plan    #Chronic AF On eliquis post surgery Continue amiodarone iv change to PO when taking soft solids/BM Oral lopressor started ok per surgery  Rates improved [facility] 01/01/23 with ERAF no plans to repeat this admission   #Chronic diastolic heart failure NYHA III-IV. Symptoms related to HF and weight. Daily weights. Keep K>4, Mg>2 Add lasix back after taking PO better Home dose was 80 mg daily    #Morbid obesity  #Abdominal pain/nausea Post bowel resection  Taking ice chips and lemon ice  Wound vac No antibiotic coverage now      For questions or updates, please contact [place] Please consult [email or web address] for contact info under        Signed, Jenkins Rouge, MD  01/05/2023, 8:44 AM

## 2023-01-05 NOTE — Progress Notes (Signed)
This is a progress note    NAME:  Timothy Macias, MRN:  [id number], DOB:  03/18/1961, LOS: 8 ADMISSION DATE:  12/28/2022, CONSULTATION DATE:  1/7 REFERRING MD:  Loney Hering, CHIEF COMPLAINT:  encephalopathy   History of Present Illness:  Timothy Macias is a 62 y/o gentleman with a history of Afib, HTN, previous SBO due to strangulated peri-umbilical hernia s/p ex-lap, OSA on CPAP who presented with 3 days of nausea, abdominal pain, and distention on 1/6. He began having nausea and vomiting in the ED. His vomiting was described as brown.  He has been feeling poorly overall since a few days before Christmas.  He had worse edema for the past few weeks and was treated by his PCP with escalating doses of diuretics, which she did not feel were beneficial.  He has not recently had constipation, he was still having bowel movements prior to his abdominal symptoms starting.  Today he has had uncontrolled A-fib with RVR and is now on amiodarone infusion.  The rapid response nurse has had to check on him multiple times throughout the day.  He had a KUB demonstrating a bowel obstruction.  NG tube was placed with rapid removal of about 3 L of gastric fluid.  He has become progressively more somnolent throughout the day.  He has OSA and is compliant with CPAP at home.  Last dose of Eliquis was this morning.  Pertinent  Medical History  Hernia surgery- ex lap with bowel resection and primary anastamosis OSA NASH Afib HTN HFpEF, mild PH Peripheral neuropathy on Lyrica; unsure etiology of neuropathy GERD Remote tobacco abuse-quit 10 years ago, multiple packs per day  Significant Hospital Events: Including procedures, antibiotic start and stop dates in addition to other pertinent events   1/6 admitted 1/7 amiodarone infusion, IVF, NGT placed to decompress abdomen. Transferring to ICU. 1/9 DCCV failed due to ongoing bowel obstruction. 1/10 underwent laparotomy with lysis of adhesions 1/11 extubated 1/12 blood  from incision with heparin restart.  Interim History / Subjective:  Tolerated sips of clear liquids and oral medication yesterday.  Transitioned off IV amiodarone.  Objective   Blood pressure (!) 140/52, pulse 100, temperature 98.4 F (36.9 C), temperature source Oral, resp. rate 16, height 5\' 8"  (1.727 m), weight (!) 168.3 kg, SpO2 94 %.    FiO2 (%):  [40 %] 40 %   Intake/Output Summary (Last 24 hours) at 01/05/2023 1210 Last data filed at 01/05/2023 0600 Gross per 24 hour  Intake 529.52 ml  Output 1925 ml  Net -1395.48 ml    Filed Weights   01/03/23 0500 01/04/23 2100 01/05/23 0351  Weight: (!) 169.8 kg (!) 168.3 kg (!) 168.3 kg    Examination: General: Obese man in no distress HEENT: Has been removed Pulmonary: Chest clear to auscultation bilaterally, no wheezing.   Cardiac: Heart sounds are unremarkable.  Remains in atrial fibrillation but now rate controlled Abdomen: Protuberant, lower midline incision with wet-to-dry dressing in place.  Bowel sounds present. GU Foley catheter in place with amber urine.  Ancillary tests personally reviewed  Mild leukocytosis 15.1 improving Hemoglobin 15.4 (hemoconcentration) Improving creatinine 1.43 Assessment & Plan:  SBO status post laparotomy and lysis of adhesions.  Delayed bowel function Acute on chronic hypoxic hypercapnic respiratory failure (OHS/OSA)-polycythemia may be the result of chronic hypoxia versus hemoconcentration.  If latter will improve with progressive rehydration. Class 3 obesity Afib/RVR- ongoing issue AKI on CKD- with volume overload NASH HTN  -Hold heparin for now.  Check with general surgery  before restarting. - Transition back to home atrial fibrillation medication -Rate control remains marginal.  Have increased metoprolol to 100 mg. -Can transfer once heart rate less than 110 -Switch bronchodilators to as needed to limit tachycardia -Advancing to clear diet per general surgery - Continue BIPAP qHS and  PRN -Minimize narcotic use to encourage resumption of bowel function. -Weaning O2, can transfer once under 4 L/min -PT consult for progressive ambulation.  Best Practice (right click and "Reselect all SmartList Selections" daily)   Diet/type: Clear liquid diet DVT prophylaxis: Prophylactic subcutaneous heparin GI prophylaxis: Protonix Lines: Left IJ central line, remove arterial line Foley: External catheter Code Status:  full code Last date of multidisciplinary goals of care discussion [wife and patient updated 1/7]  Kipp Brood, MD [facility] ICU Physician [place]  Pager: [phone] Mobile: [phone] After hours: [phone].

## 2023-01-05 NOTE — Progress Notes (Signed)
3 Days Post-Op   Subjective/Chief Complaint: Pt doing well this AM No further bleeding, some old blood in stool No n/v   Objective: Vital signs in last 24 hours: Temp:  [97.1 F (36.2 C)-98.7 F (37.1 C)] 97.9 F (36.6 C) (01/13 0448) Pulse Rate:  [83-109] 92 (01/13 0700) Resp:  [12-25] 18 (01/13 0700) BP: (85-161)/(62-114) 137/113 (01/13 0700) SpO2:  [83 %-99 %] 99 % (01/13 0736) FiO2 (%):  [40 %] 40 % (01/12 2150) Weight:  [168.3 kg] 168.3 kg (01/13 0351) Last BM Date : 01/05/23  Intake/Output from previous day: 01/12 0701 - 01/13 0700 In: 529.5 [P.O.:180; I.V.:299.5] Out: 1925 [Urine:1525; Stool:400] Intake/Output this shift: No intake/output data recorded.  General appearance: alert and cooperative GI: soft, non-tender; bowel sounds normal; no masses,  no organomegaly and inc c.d.i  Lab Results:  Recent Labs    01/04/23 0423 01/05/23 0433  WBC 17.4* 15.1*  HGB 17.1* 15.4  HCT 52.1* 48.3  PLT 277 195   BMET Recent Labs    01/03/23 0554 01/04/23 0656  NA 136 135  K 3.8 3.2*  CL 87* 89*  CO2 33* 34*  GLUCOSE 104* 84  BUN 39* 30*  CREATININE 1.55* 1.48*  CALCIUM 8.8* 8.6*   PT/INR No results for input(s): "LABPROT", "INR" in the last 72 hours. ABG Recent Labs    01/02/23 1711  PHART 7.469*  HCO3 35.5*    Studies/Results: [place] EKG SITE RITE  Result Date: 01/03/2023 If Site Rite image not attached, placement could not be confirmed due to current cardiac rhythm.   Anti-infectives: Anti-infectives (From admission, onward)    Start     Dose/Rate Route Frequency Ordered Stop   01/03/23 0000  ciprofloxacin (CIPRO) IVPB 400 mg        400 mg 200 mL/hr over 60 Minutes Intravenous Every 12 hours 01/02/23 1649 01/03/23 1206   01/02/23 2200  metroNIDAZOLE (FLAGYL) IVPB 500 mg        500 mg 100 mL/hr over 60 Minutes Intravenous Every 8 hours 01/02/23 1649 01/03/23 1519   01/02/23 0915  ciprofloxacin (CIPRO) IVPB 400 mg        400 mg 200 mL/hr over 60  Minutes Intravenous On call to O.R. 01/02/23 0817 01/02/23 1431   01/02/23 0915  metroNIDAZOLE (FLAGYL) IVPB 500 mg        500 mg 100 mL/hr over 60 Minutes Intravenous On call to O.R. 01/02/23 0817 01/02/23 1508       Assessment/Plan: POD 3, s/p ex lap with LOA and SBR for SBO, Dr. Donne Hazel 1/10 -patient having bowel function today. OK for clears -hold on TNA given bowel function and hope to progress diet at this point -NS WD dressing changes to midline wound BID, likely to replace VAC on Monday -mobilize with nursing and PT -IS, pulm toilet     FEN - clears OK VTE - heparin gtt on hold due to bleeding wound. Converting to just heparin SQ for prophylaxis ID - completed 24 hrs post op abx   Afib T. CHF HTN OSA NASH Acute on CKD    LOS: 8 days    Ralene Ok 01/05/2023

## 2023-01-06 DIAGNOSIS — J9622 Acute and chronic respiratory failure with hypercapnia: Secondary | ICD-10-CM | POA: Diagnosis not present

## 2023-01-06 DIAGNOSIS — I4891 Unspecified atrial fibrillation: Secondary | ICD-10-CM | POA: Diagnosis not present

## 2023-01-06 LAB — CBC
HCT: 51.7 % (ref 39.0–52.0)
Hemoglobin: 16.2 g/dL (ref 13.0–17.0)
MCH: 26.1 pg (ref 26.0–34.0)
MCHC: 31.3 g/dL (ref 30.0–36.0)
MCV: 83.4 fL (ref 80.0–100.0)
Platelets: 153 10*3/uL (ref 150–400)
RBC: 6.2 MIL/uL — ABNORMAL HIGH (ref 4.22–5.81)
RDW: 19.9 % — ABNORMAL HIGH (ref 11.5–15.5)
WBC: 16.2 10*3/uL — ABNORMAL HIGH (ref 4.0–10.5)
nRBC: 0 % (ref 0.0–0.2)

## 2023-01-06 LAB — GLUCOSE, CAPILLARY
Glucose-Capillary: 117 mg/dL — ABNORMAL HIGH (ref 70–99)
Glucose-Capillary: 133 mg/dL — ABNORMAL HIGH (ref 70–99)
Glucose-Capillary: 71 mg/dL (ref 70–99)
Glucose-Capillary: 91 mg/dL (ref 70–99)
Glucose-Capillary: 93 mg/dL (ref 70–99)
Glucose-Capillary: 95 mg/dL (ref 70–99)

## 2023-01-06 NOTE — Progress Notes (Signed)
This is a progress note    NAME:  Timothy Macias, MRN:  [id number], DOB:  1961/09/12, LOS: 9 ADMISSION DATE:  12/28/2022, CONSULTATION DATE:  1/7 REFERRING MD:  Loney Hering, CHIEF COMPLAINT:  encephalopathy   History of Present Illness:  Timothy Macias is a 62 y/o gentleman with a history of Afib, HTN, previous SBO due to strangulated peri-umbilical hernia s/p ex-lap, OSA on CPAP who presented with 3 days of nausea, abdominal pain, and distention on 1/6. He began having nausea and vomiting in the ED. His vomiting was described as brown.  He has been feeling poorly overall since a few days before Christmas.  He had worse edema for the past few weeks and was treated by his PCP with escalating doses of diuretics, which she did not feel were beneficial.  He has not recently had constipation, he was still having bowel movements prior to his abdominal symptoms starting.  Today he has had uncontrolled A-fib with RVR and is now on amiodarone infusion.  The rapid response nurse has had to check on him multiple times throughout the day.  He had a KUB demonstrating a bowel obstruction.  NG tube was placed with rapid removal of about 3 L of gastric fluid.  He has become progressively more somnolent throughout the day.  He has OSA and is compliant with CPAP at home.  Last dose of Eliquis was this morning.  Pertinent  Medical History  Hernia surgery- ex lap with bowel resection and primary anastamosis OSA NASH Afib HTN HFpEF, mild PH Peripheral neuropathy on Lyrica; unsure etiology of neuropathy GERD Remote tobacco abuse-quit 10 years ago, multiple packs per day  Significant Hospital Events: Including procedures, antibiotic start and stop dates in addition to other pertinent events   1/6 admitted 1/7 amiodarone infusion, IVF, NGT placed to decompress abdomen. Transferring to ICU. 1/9 DCCV failed due to ongoing bowel obstruction. 1/10 underwent laparotomy with lysis of adhesions 1/11 extubated 1/12 blood  from incision with heparin restart.  Interim History / Subjective:  HR remains controlled on current oral medications. Surgery has advanced diet.   Objective   Blood pressure (!) 147/89, pulse 89, temperature 98.4 F (36.9 C), temperature source Oral, resp. rate 20, height 5\' 8"  (1.727 m), weight (!) 164.8 kg, SpO2 93 %.        Intake/Output Summary (Last 24 hours) at 01/06/2023 0805 Last data filed at 01/06/2023 0600 Gross per 24 hour  Intake 680 ml  Output 1975 ml  Net -1295 ml    Filed Weights   01/04/23 2100 01/05/23 0351 01/06/23 0419  Weight: (!) 168.3 kg (!) 168.3 kg (!) 164.8 kg    Examination: General: Obese man in no distress HEENT: Has been removed Pulmonary: Chest clear to auscultation bilaterally, no wheezing.   Cardiac: Heart sounds are unremarkable.  Remains in atrial fibrillation but now rate controlled Abdomen: Protuberant, lower midline incision with wet-to-dry dressing in place.  Bowel sounds present. GU Clear urine in external catheter.   Ancillary tests personally reviewed  Mild leukocytosis 16.2 improving Hemoglobin 16.2 Improving creatinine 1.43 Assessment & Plan:  SBO status post laparotomy and lysis of adhesions.  Improving bowel function.  Acute on chronic hypoxic hypercapnic respiratory failure (OHS/OSA)-polycythemia may be the result of chronic hypoxia versus hemoconcentration.  If latter will improve with progressive rehydration. Class 3 obesity Afib/RVR- controlled.  AKI on CKD - now resolved NASH HTN  -Hold heparin for now.  Check with general surgery before restarting. Should be able to do so  soon.  - Transitioned back to home atrial fibrillation medication. Metoprolol increased to 100mg  -Switched bronchodilators to as needed to limit tachycardia -Advancing  diet per general surgery - Will need VAC of skin incision.  - Continue BIPAP qHS and PRN -Minimize narcotic use to encourage resumption of bowel function. -Weaning O2, can transfer  once under 4 L/min -PT consult for progressive ambulation. His weight is a major impediment to mobilization.  - Ready for transfer. Orders reconciled and TRH notified.   Best Practice (right click and "Reselect all SmartList Selections" daily)   Diet/type: Progressive diet.  DVT prophylaxis: Prophylactic subcutaneous heparin GI prophylaxis: Protonix Lines: central lines are out.  Foley: External catheter Code Status:  full code Last date of multidisciplinary goals of care discussion [wife and patient updated 1/7]  Kipp Brood, MD [facility] ICU Physician [place]  Pager: [phone] Mobile: [phone] After hours: [phone].

## 2023-01-06 NOTE — Progress Notes (Signed)
4 Days Post-Op   Subjective/Chief Complaint: Doing well Mult BMs   Objective: Vital signs in last 24 hours: Temp:  [97.9 F (36.6 C)-98.4 F (36.9 C)] 98.3 F (36.8 C) (01/14 0400) Pulse Rate:  [67-138] 89 (01/14 0700) Resp:  [13-26] 20 (01/14 0700) BP: (111-155)/(45-98) 147/89 (01/14 0700) SpO2:  [83 %-97 %] 93 % (01/14 0700) Weight:  [164.8 kg] 164.8 kg (01/14 0419) Last BM Date : 01/05/23  Intake/Output from previous day: 01/13 0701 - 01/14 0700 In: 680 [P.O.:680] Out: 1975 [VQQVZ:5638; Stool:50] Intake/Output this shift: No intake/output data recorded.  General appearance: alert and cooperative GI: soft, non-tender; bowel sounds normal; no masses,  no organomegaly and inc c/d/i  Lab Results:  Recent Labs    01/05/23 0433 01/06/23 0414  WBC 15.1* 16.2*  HGB 15.4 16.2  HCT 48.3 51.7  PLT 195 153   BMET Recent Labs    01/04/23 0656 01/05/23 0630  NA 135 138  K 3.2* 3.3*  CL 89* 91*  CO2 34* 29  GLUCOSE 84 83  BUN 30* 37*  CREATININE 1.48* 1.43*  CALCIUM 8.6* 8.7*   PT/INR No results for input(s): "LABPROT", "INR" in the last 72 hours. ABG No results for input(s): "PHART", "HCO3" in the last 72 hours.  Invalid input(s): "PCO2", "PO2"  Studies/Results: No results found.  Anti-infectives: Anti-infectives (From admission, onward)    Start     Dose/Rate Route Frequency Ordered Stop   01/03/23 0000  ciprofloxacin (CIPRO) IVPB 400 mg        400 mg 200 mL/hr over 60 Minutes Intravenous Every 12 hours 01/02/23 1649 01/03/23 1206   01/02/23 2200  metroNIDAZOLE (FLAGYL) IVPB 500 mg        500 mg 100 mL/hr over 60 Minutes Intravenous Every 8 hours 01/02/23 1649 01/03/23 1519   01/02/23 0915  ciprofloxacin (CIPRO) IVPB 400 mg        400 mg 200 mL/hr over 60 Minutes Intravenous On call to O.R. 01/02/23 0817 01/02/23 1431   01/02/23 0915  metroNIDAZOLE (FLAGYL) IVPB 500 mg        500 mg 100 mL/hr over 60 Minutes Intravenous On call to O.R. 01/02/23  0817 01/02/23 1508       Assessment/Plan: POD 4, s/p ex lap with LOA and SBR for SBO, Dr. Donne Hazel 1/10 -patient having bowel function today. OK fto ADAT -NS WD dressing changes to midline wound BID, likely to replace VAC on Monday -mobilize with nursing and PT -IS, pulm toilet     FEN - ADAT OK VTE - heparin gtt on hold due to bleeding wound. Converting to just heparin SQ for prophylaxis ID - completed 24 hrs post op abx   Afib T. CHF HTN OSA NASH Acute on CKD  LOS: 9 days    Ralene Ok 01/06/2023

## 2023-01-06 NOTE — Progress Notes (Signed)
Pt said his wife will bring his home cpap tomorrow. Not comfortable with ours.

## 2023-01-06 NOTE — Progress Notes (Signed)
Rounding Note    Patient Name: Timothy Macias Date of Encounter: 01/06/2023  [place] Cardiologist: Quentin Ore and [facility]  Subjective   Feeling better wants to get OOB to bathroom still mostly liquid diet   Inpatient Medications    Scheduled Meds:  (feeding supplement) PROSource Plus  30 mL Oral TID BM   amiodarone  400 mg Oral Daily   Chlorhexidine Gluconate Cloth  6 each Topical Daily   feeding supplement  1 Container Oral TID BM   heparin injection (subcutaneous)  5,000 Units Subcutaneous Q8H   insulin aspart  1-3 Units Subcutaneous Q4H   metoprolol succinate  100 mg Oral Daily   pantoprazole (PROTONIX) IV  40 mg Intravenous Q24H   revefenacin  175 mcg Nebulization Daily   tamsulosin  0.4 mg Oral Daily   Continuous Infusions:   PRN Meds: artificial tears, fentaNYL (SUBLIMAZE) injection, levalbuterol, liver oil-zinc oxide, menthol-cetylpyridinium, metoprolol tartrate, ondansetron (ZOFRAN) IV, mouth rinse, phenol, simethicone, traMADol-acetaminophen   Vital Signs    Vitals:   01/06/23 0500 01/06/23 0600 01/06/23 0700 01/06/23 0758  BP: 129/76 131/68 (!) 147/89   Pulse: 67 81 89   Resp: 17 (!) 23 20   Temp:    98.4 F (36.9 C)  TempSrc:    Oral  SpO2: (!) 84% (!) 86% 93%   Weight:      Height:        Intake/Output Summary (Last 24 hours) at 01/06/2023 2683 Last data filed at 01/06/2023 0600 Gross per 24 hour  Intake 680 ml  Output 1975 ml  Net -1295 ml      01/06/2023    4:19 AM 01/05/2023    3:51 AM 01/04/2023    9:00 PM  Last 3 Weights  Weight (lbs) 363 lb 5.1 oz 371 lb 0.6 oz 371 lb 0.6 oz  Weight (kg) 164.8 kg 168.3 kg 168.3 kg      Telemetry    AF w/ RVR, rates 90-100  - Personally Reviewed  ECG    AF w/ RVR. RBBB. LAFB - Personally Reviewed  Physical Exam   Super morbid obesity NG tube draining Post surgery with wound vac looks good  Lungs clear anteriorly Distant heart sounds Plus 1-2 edema  Labs    High Sensitivity  Troponin:   Recent Labs  Lab 12/28/22 1400 12/28/22 1620 12/29/22 1556 12/29/22 1713  TROPONINIHS 9 8 7 7      Chemistry Recent Labs  Lab 12/30/22 1512 12/30/22 2105 12/31/22 0259 01/01/23 0210 01/03/23 0554 01/04/23 0656 01/05/23 0433 01/05/23 0630  NA 131*   < > 134*   < > 136 135  --  138  K 3.0*   < > 4.2   < > 3.8 3.2*  --  3.3*  CL 86*   < > 90*   < > 87* 89*  --  91*  CO2 30   < > 22   < > 33* 34*  --  29  GLUCOSE 114*   < > 90   < > 104* 84  --  83  BUN 60*   < > 63*   < > 39* 30*  --  37*  CREATININE 2.46*   < > 2.29*   < > 1.55* 1.48*  --  1.43*  CALCIUM 8.3*   < > 8.1*   < > 8.8* 8.6*  --  8.7*  MG 2.1   < >  --    < > 2.2 2.0 2.0  --  PROT 6.7  --   --   --   --  5.6*  --   --   ALBUMIN 3.2*  --  3.1*  --   --  2.8*  --   --   AST 22  --   --   --   --  23  --   --   ALT 13  --   --   --   --  10  --   --   ALKPHOS 51  --   --   --   --  47  --   --   BILITOT 1.2  --   --   --   --  1.0  --   --   GFRNONAA 29*   < > 32*   < > 51* 53*  --  56*  ANIONGAP 15   < > 22*   < > 16* 12  --  18*   < > = values in this interval not displayed.    Lipids  Recent Labs  Lab 01/03/23 0554  TRIG 127    Hematology Recent Labs  Lab 01/04/23 0423 01/05/23 0433 01/06/23 0414  WBC 17.4* 15.1* 16.2*  RBC 6.43* 5.87* 6.20*  HGB 17.1* 15.4 16.2  HCT 52.1* 48.3 51.7  MCV 81.0 82.3 83.4  MCH 26.6 26.2 26.1  MCHC 32.8 31.9 31.3  RDW 19.9* 19.4* 19.9*  PLT 277 195 153   Thyroid  Recent Labs  Lab 12/30/22 1450  TSH 0.657    BNP Recent Labs  Lab 12/30/22 1306 12/31/22 0259 01/01/23 0210  BNP 173.4* 72.7 82.1    DDimer  Recent Labs  Lab 12/30/22 1512  DDIMER 3.47*     Radiology    No results found.    Assessment & Plan    #Chronic AF On eliquis post surgery Continue amiodarone iv change to PO when taking soft solids/BM Oral lopressor started ok per surgery  Having periods of NSR this am and yesterday Still tachycardic from surgery and  obesity [facility] 01/01/23 with ERAF no plans to repeat this admission   #Chronic diastolic heart failure NYHA III-IV. Symptoms related to HF and weight. Daily weights. Keep K>4, Mg>2 Add lasix back after taking PO better Home dose was 80 mg daily I/O negative 1200 cc   #Morbid obesity  #Abdominal pain/nausea Post bowel resection  Taking ice chips and lemon ice  Wound vac No antibiotic coverage now      For questions or updates, please contact Kathleen Please consult [email or web address] for contact info under        Signed, Jenkins Rouge, MD  01/06/2023, 8:22 AM

## 2023-01-06 NOTE — Progress Notes (Signed)
Pt placed on cpap for the night and seems to be tolerating it well at this time.  

## 2023-01-07 DIAGNOSIS — I480 Paroxysmal atrial fibrillation: Secondary | ICD-10-CM | POA: Diagnosis not present

## 2023-01-07 DIAGNOSIS — I4891 Unspecified atrial fibrillation: Secondary | ICD-10-CM | POA: Diagnosis not present

## 2023-01-07 DIAGNOSIS — I509 Heart failure, unspecified: Secondary | ICD-10-CM

## 2023-01-07 LAB — BASIC METABOLIC PANEL
Anion gap: 10 (ref 5–15)
BUN: 23 mg/dL (ref 8–23)
CO2: 29 mmol/L (ref 22–32)
Calcium: 8.5 mg/dL — ABNORMAL LOW (ref 8.9–10.3)
Chloride: 95 mmol/L — ABNORMAL LOW (ref 98–111)
Creatinine, Ser: 1.19 mg/dL (ref 0.61–1.24)
GFR, Estimated: >60 mL/min (ref >60)
Glucose, Bld: 97 mg/dL (ref 70–99)
Potassium: 3 mmol/L — ABNORMAL LOW (ref 3.5–5.1)
Sodium: 134 mmol/L — ABNORMAL LOW (ref 135–145)

## 2023-01-07 LAB — CBC
HCT: 45.3 % (ref 39.0–52.0)
Hemoglobin: 14.2 g/dL (ref 13.0–17.0)
MCH: 25.7 pg — ABNORMAL LOW (ref 26.0–34.0)
MCHC: 31.3 g/dL (ref 30.0–36.0)
MCV: 82.1 fL (ref 80.0–100.0)
Platelets: 229 10*3/uL (ref 150–400)
RBC: 5.52 MIL/uL (ref 4.22–5.81)
RDW: 19 % — ABNORMAL HIGH (ref 11.5–15.5)
WBC: 14.8 10*3/uL — ABNORMAL HIGH (ref 4.0–10.5)
nRBC: 0 % (ref 0.0–0.2)

## 2023-01-07 LAB — GLUCOSE, CAPILLARY
Glucose-Capillary: 114 mg/dL — ABNORMAL HIGH (ref 70–99)
Glucose-Capillary: 81 mg/dL (ref 70–99)
Glucose-Capillary: 97 mg/dL (ref 70–99)

## 2023-01-07 MED ORDER — AMIODARONE HCL 200 MG PO TABS: 400.0000 mg | ORAL_TABLET | Freq: Every day | ORAL | Status: DC

## 2023-01-07 MED ORDER — POTASSIUM CHLORIDE CRYS ER 20 MEQ PO TBCR
60.0000 meq | EXTENDED_RELEASE_TABLET | ORAL | Status: AC
  Administered 2023-01-07 (×2): 60 meq via ORAL
  Filled 2023-01-07 (×2): qty 3

## 2023-01-07 MED ORDER — FERROUS SULFATE 325 (65 FE) MG PO TABS
325.0000 mg | ORAL_TABLET | Freq: Every day | ORAL | Status: DC
  Administered 2023-01-08: 325 mg via ORAL
  Filled 2023-01-07: qty 1

## 2023-01-07 MED ORDER — DULOXETINE HCL 60 MG PO CPEP
60.0000 mg | ORAL_CAPSULE | Freq: Two times a day (BID) | ORAL | Status: DC
  Administered 2023-01-07 – 2023-01-08 (×3): 60 mg via ORAL
  Filled 2023-01-07 (×3): qty 1

## 2023-01-07 MED ORDER — HYDROMORPHONE HCL 1 MG/ML IJ SOLN
0.5000 mg | INTRAMUSCULAR | Status: DC | PRN
  Administered 2023-01-07 – 2023-01-08 (×2): 0.5 mg via INTRAVENOUS
  Filled 2023-01-07 (×2): qty 0.5

## 2023-01-07 MED ORDER — TRAMADOL HCL 50 MG PO TABS
50.0000 mg | ORAL_TABLET | Freq: Four times a day (QID) | ORAL | Status: DC | PRN
  Administered 2023-01-08: 50 mg via ORAL
  Filled 2023-01-07: qty 1

## 2023-01-07 MED ORDER — OXYCODONE HCL 5 MG PO TABS
60.0000 mg | ORAL_TABLET | Freq: Three times a day (TID) | ORAL | Status: DC | PRN
  Administered 2023-01-07 – 2023-01-08 (×3): 60 mg via ORAL
  Filled 2023-01-07 (×3): qty 12

## 2023-01-07 MED ORDER — PREGABALIN 100 MG PO CAPS
100.0000 mg | ORAL_CAPSULE | Freq: Every day | ORAL | Status: DC
  Administered 2023-01-07 – 2023-01-08 (×2): 100 mg via ORAL
  Filled 2023-01-07 (×2): qty 1

## 2023-01-07 MED ORDER — ACETAMINOPHEN 500 MG PO TABS
1000.0000 mg | ORAL_TABLET | Freq: Three times a day (TID) | ORAL | Status: DC
  Administered 2023-01-07 – 2023-01-08 (×3): 1000 mg via ORAL
  Filled 2023-01-07 (×3): qty 2

## 2023-01-07 MED ORDER — CYCLOBENZAPRINE HCL 10 MG PO TABS: 10.0000 mg | ORAL_TABLET | Freq: Three times a day (TID) | ORAL | Status: DC | PRN

## 2023-01-07 NOTE — Progress Notes (Signed)
Pt. Refused cpap.

## 2023-01-07 NOTE — Progress Notes (Signed)
Physical Therapy Treatment Patient Details Name: Timothy Macias MRN: [id number] DOB: 09/02/1961 Today's Date: 01/07/2023   History of Present Illness Pt is a 62 y.o. M who presents 12/30/2022 with SBO, afib/RVR, acute on chronic hypercapnea. Pt underwent cardioversion on 1/09. Underwent exploratory laparotomy for lysis of adhesions and small bowel resection on 1/10.  Significant PMH: Afib, HTN, previous SBO due to strangulated peri-umbilical hernia s/p ex-lap, OSA on CPAP.    PT Comments    Pt was seen for progressing from bed to sit to chair with his AFO's on during transition.  Pt is motivated, encouraged PT and OT to let him get to chair with good control of standing and walker developing.  Pt has progressed from IC to current status with walker and gait for steps, and will recommend CIR to move along his current level of independence and safety.  Follow acutely to pinpoint strength on LE's, for standing endurance, standing balance and generally to work on quality of gait in his orthotics.  Pt has AFO's with good shoe and brace fit to help make mobility safer and closer to PLOF.   Recommendations for follow up therapy are one component of a multi-disciplinary discharge planning process, led by the attending physician.  Recommendations may be updated based on patient status, additional functional criteria and insurance authorization.  Follow Up Recommendations  Acute inpatient rehab (3hours/day)     Assistance Recommended at Discharge Frequent or constant Supervision/Assistance  Patient can return home with the following Assistance with cooking/housework;Help with stairs or ramp for entrance;Assist for transportation;Two people to help with walking and/or transfers;Two people to help with bathing/dressing/bathroom   Equipment Recommendations  None recommended by PT    Recommendations for Other Services Rehab consult     Precautions / Restrictions Precautions Precautions: Fall;Other  (comment) Precaution Comments: abdominal wound Required Braces or Orthoses: Other Brace Other Brace: bilat AFO's Restrictions Weight Bearing Restrictions: No     Mobility  Bed Mobility Overal bed mobility: Needs Assistance Bed Mobility: Rolling, Sidelying to Sit Rolling: +2 for physical assistance, +2 for safety/equipment, Mod assist Sidelying to sit: +2 for physical assistance, +2 for safety/equipment, Mod assist       General bed mobility comments: body mechanics and sequencing instruction    Transfers Overall transfer level: Needs assistance Equipment used: Rolling walker (2 wheels) Transfers: Sit to/from Stand, Bed to chair/wheelchair/BSC Sit to Stand: +2 physical assistance, +2 safety/equipment, Min assist   Step pivot transfers: Min assist, +2 physical assistance, +2 safety/equipment       General transfer comment: tends to use forearms on walker    Ambulation/Gait         Gait velocity: reduced Gait velocity interpretation: <1.31 ft/sec, indicative of household ambulator Pre-gait activities: standing balance and posture cues General Gait Details: unable to take steps more than to transition to chair   Stairs             Wheelchair Mobility    Modified Rankin (Stroke Patients Only)       Balance Overall balance assessment: Needs assistance   Sitting balance-Leahy Scale: Fair       Standing balance-Leahy Scale: Poor                              Cognition Arousal/Alertness: Awake/alert Behavior During Therapy: WFL for tasks assessed/performed Overall Cognitive Status: Within Functional Limits for tasks assessed  Exercises      General Comments General comments (skin integrity, edema, etc.): Pt is focused on abd incision but assured him to be careful with body mechanics was only restriction      Pertinent Vitals/Pain Pain Assessment Pain Assessment: Faces Faces  Pain Scale: Hurts little more Pain Location: abdomen Pain Descriptors / Indicators: Grimacing, Guarding, Aching Pain Intervention(s): Limited activity within patient's tolerance, Monitored during session, Repositioned    Home Living                          Prior Function            PT Goals (current goals can now be found in the care plan section) Acute Rehab PT Goals Patient Stated Goal: go home Progress towards PT goals: Progressing toward goals    Frequency    Min 3X/week      PT Plan Current plan remains appropriate    Co-evaluation   Reason for Co-Treatment: For patient/therapist safety   OT goals addressed during session: ADL's and self-care      AM-PAC PT "6 Clicks" Mobility   Outcome Measure  Help needed turning from your back to your side while in a flat bed without using bedrails?: Total Help needed moving from lying on your back to sitting on the side of a flat bed without using bedrails?: Total Help needed moving to and from a bed to a chair (including a wheelchair)?: Total Help needed standing up from a chair using your arms (e.g., wheelchair or bedside chair)?: Total Help needed to walk in hospital room?: Total Help needed climbing 3-5 steps with a railing? : Total 6 Click Score: 6    End of Session Equipment Utilized During Treatment: Oxygen Activity Tolerance: Patient limited by pain;Patient limited by fatigue Patient left: in chair;with chair alarm set;with call bell/phone within reach Nurse Communication: Mobility status;Need for lift equipment PT Visit Diagnosis: Unsteadiness on feet (R26.81);Difficulty in walking, not elsewhere classified (R26.2);Pain;Other abnormalities of gait and mobility (R26.89) Pain - part of body:  (abdomen)     Time: 2956-2130 PT Time Calculation (min) (ACUTE ONLY): 34 min  Charges:  $Therapeutic Activity: 8-22 mins    Ramond Dial 01/07/2023, 12:35 PM  Mee Hives, PT PhD Acute Rehab Dept. Number:  [place] [facility] [place]

## 2023-01-07 NOTE — Progress Notes (Signed)
Occupational Therapy Treatment Patient Details Name: Timothy Macias MRN: [id number] DOB: 12-Feb-1961 Today's Date: 01/07/2023   History of present illness Pt is a 62 y.o. M who presents 12/30/2022 with SBO, afib/RVR, acute on chronic hypercapnea. Pt underwent cardioversion on 1/09. Underwent exploratory laparotomy for lysis of adhesions and small bowel resection on 1/10.  Significant PMH: Afib, HTN, previous SBO due to strangulated peri-umbilical hernia s/p ex-lap, OSA on CPAP.   OT comments  Total A to don socks, AFOs and shoes at bed level and min to don gown. Pt progressed to EOB using log roll technique with +2 mod assist, stood and transferred from elevated bed with RW and +2 min assist. Pt completed grooming in sitting with set up. Pt pleased to be up in chair. VSS throughout session, SpO2 92% or above on RA.    Recommendations for follow up therapy are one component of a multi-disciplinary discharge planning process, led by the attending physician.  Recommendations may be updated based on patient status, additional functional criteria and insurance authorization.    Follow Up Recommendations  Acute inpatient rehab (3hours/day)     Assistance Recommended at Discharge Frequent or constant Supervision/Assistance  Patient can return home with the following  A lot of help with bathing/dressing/bathroom;Assistance with cooking/housework;Assistance with feeding;Direct supervision/assist for medications management;Direct supervision/assist for financial management;Assist for transportation;Help with stairs or ramp for entrance;Two people to help with walking and/or transfers   Equipment Recommendations  None recommended by OT    Recommendations for Other Services      Precautions / Restrictions Precautions Precautions: Fall;Other (comment) Precaution Comments: abdominal wound Required Braces or Orthoses: Other Brace Other Brace: bilat AFO's Restrictions Weight Bearing Restrictions: No        Mobility Bed Mobility Overal bed mobility: Needs Assistance Bed Mobility: Rolling, Sidelying to Sit Rolling: +2 for physical assistance, Mod assist Sidelying to sit: +2 for physical assistance, Mod assist       General bed mobility comments: cues for log roll technique, use of rail, pt able to get LEs over EOB, assist to raise trunk and for L hip to EOB    Transfers Overall transfer level: Needs assistance Equipment used: Rolling walker (2 wheels) Transfers: Sit to/from Stand, Bed to chair/wheelchair/BSC Sit to Stand: +2 physical assistance, From elevated surface, Min assist     Step pivot transfers: Min assist, +2 physical assistance     General transfer comment: Assist to stabilize walker, to rise and steady, flexed posture     Balance Overall balance assessment: Needs assistance   Sitting balance-Leahy Scale: Fair       Standing balance-Leahy Scale: Poor                             ADL either performed or assessed with clinical judgement   ADL Overall ADL's : Needs assistance/impaired     Grooming: Oral care;Sitting;Set up           Upper Body Dressing : Minimal assistance;Bed level   Lower Body Dressing: Total assistance;Bed level Lower Body Dressing Details (indicate cue type and reason): socks, shoes and AFOs                    Extremity/Trunk Assessment              Vision       Perception     Praxis      Cognition Arousal/Alertness: Awake/alert Behavior During Therapy: WFL for  tasks assessed/performed Overall Cognitive Status: Within Functional Limits for tasks assessed                                          Exercises      Shoulder Instructions       General Comments      Pertinent Vitals/ Pain       Pain Assessment Pain Assessment: Faces Faces Pain Scale: Hurts little more Pain Location: abdomen Pain Descriptors / Indicators: Grimacing, Guarding, Sharp Pain Intervention(s):  Monitored during session, Repositioned  Home Living                                          Prior Functioning/Environment              Frequency  Min 2X/week        Progress Toward Goals  OT Goals(current goals can now be found in the care plan section)  Progress towards OT goals: Progressing toward goals  Acute Rehab OT Goals OT Goal Formulation: With patient Time For Goal Achievement: 01/15/23 Potential to Achieve Goals: Good  Plan Discharge plan remains appropriate    Co-evaluation    PT/OT/SLP Co-Evaluation/Treatment: Yes Reason for Co-Treatment: For patient/therapist safety   OT goals addressed during session: ADL's and self-care      AM-PAC OT "6 Clicks" Daily Activity     Outcome Measure   Help from another person eating meals?: None Help from another person taking care of personal grooming?: A Little Help from another person toileting, which includes using toliet, bedpan, or urinal?: Total Help from another person bathing (including washing, rinsing, drying)?: A Lot Help from another person to put on and taking off regular upper body clothing?: A Little Help from another person to put on and taking off regular lower body clothing?: Total 6 Click Score: 14    End of Session Equipment Utilized During Treatment: Rolling walker (2 wheels)  OT Visit Diagnosis: Unsteadiness on feet (R26.81);Other abnormalities of gait and mobility (R26.89);Muscle weakness (generalized) (M62.81);Pain   Activity Tolerance Patient tolerated treatment well   Patient Left in chair;with call bell/phone within reach   Nurse Communication Mobility status;Other (comment) (pt with SpO2>92% on RA)        Time: 2671-2458 OT Time Calculation (min): 34 min  Charges: OT General Charges $OT Visit: 1 Visit OT Treatments $Self Care/Home Management : 8-22 mins  Cleta Alberts, OTR/L Acute Rehabilitation Services Office: [phone]   [name] 01/07/2023, 11:28 AM

## 2023-01-07 NOTE — Progress Notes (Signed)
Progress Note  5 Days Post-Op  Subjective: Pt reports he has tolerated CLD and is having diarrhea - 13 loose BMs in last 24 hrs he reports. He denies n/v. Having incisional abdominal pain. Takes 60 mg of Oxy IR TID at home and has not been restarted on this.   Objective: Vital signs in last 24 hours: Temp:  [97.9 F (36.6 C)-98.3 F (36.8 C)] 97.9 F (36.6 C) (01/15 0412) Pulse Rate:  [91-99] 99 (01/15 0729) Resp:  [13-28] 18 (01/15 0729) BP: (115-137)/(66-85) 115/78 (01/15 0729) SpO2:  [84 %-98 %] 98 % (01/15 0934) Weight:  [163.2 kg] 163.2 kg (01/15 0001) Last BM Date : 01/05/23  Intake/Output from previous day: 01/14 0701 - 01/15 0700 In: 80 [P.O.:880] Out: 1350 [Urine:1350] Intake/Output this shift: Total I/O In: 360 [P.O.:360] Out: -   PE: General: pleasant, WD, obese male who is laying in bed in NAD Heart: regular, rate, and rhythm.  Lungs: Respiratory effort nonlabored Abd: soft, appropriately ttp, wound clean without any active bleeding Psych: A&Ox3 with an appropriate affect.    Lab Results:  Recent Labs    01/06/23 0414 01/07/23 0028  WBC 16.2* 14.8*  HGB 16.2 14.2  HCT 51.7 45.3  PLT 153 229   BMET Recent Labs    01/05/23 0630 01/07/23 0028  NA 138 134*  K 3.3* 3.0*  CL 91* 95*  CO2 29 29  GLUCOSE 83 97  BUN 37* 23  CREATININE 1.43* 1.19  CALCIUM 8.7* 8.5*   PT/INR No results for input(s): "LABPROT", "INR" in the last 72 hours. CMP     Component Value Date/Time   NA 134 (L) 01/07/2023 0028   K 3.0 (L) 01/07/2023 0028   CL 95 (L) 01/07/2023 0028   CO2 29 01/07/2023 0028   GLUCOSE 97 01/07/2023 0028   BUN 23 01/07/2023 0028   CREATININE 1.19 01/07/2023 0028   CALCIUM 8.5 (L) 01/07/2023 0028   PROT 5.6 (L) 01/04/2023 0656   ALBUMIN 2.8 (L) 01/04/2023 0656   AST 23 01/04/2023 0656   ALT 10 01/04/2023 0656   ALKPHOS 47 01/04/2023 0656   BILITOT 1.0 01/04/2023 0656   GFRNONAA >60 01/07/2023 0028   Lipase  No results found  for: "LIPASE"     Studies/Results: No results found.  Anti-infectives: Anti-infectives (From admission, onward)    Start     Dose/Rate Route Frequency Ordered Stop   01/03/23 0000  ciprofloxacin (CIPRO) IVPB 400 mg        400 mg 200 mL/hr over 60 Minutes Intravenous Every 12 hours 01/02/23 1649 01/03/23 1206   01/02/23 2200  metroNIDAZOLE (FLAGYL) IVPB 500 mg        500 mg 100 mL/hr over 60 Minutes Intravenous Every 8 hours 01/02/23 1649 01/03/23 1519   01/02/23 0915  ciprofloxacin (CIPRO) IVPB 400 mg        400 mg 200 mL/hr over 60 Minutes Intravenous On call to O.R. 01/02/23 0817 01/02/23 1431   01/02/23 0915  metroNIDAZOLE (FLAGYL) IVPB 500 mg        500 mg 100 mL/hr over 60 Minutes Intravenous On call to O.R. 01/02/23 0817 01/02/23 1508        Assessment/Plan  POD 5, s/p ex lap with LOA and SBR for SBO, Dr. Donne Hazel 1/10 - patient having bowel function - advanced to soft diet  - reordered [facility] - mobilize with nursing and PT - IS, pulm toilet - reordered chronic home pain meds  FEN - soft diet  VTE - SQH ID - completed 24 hrs post op abx   - below per TRH -  Afib T. CHF HTN OSA NASH Acute on CKD  Morbid obesity   LOS: 10 days    Norm Parcel, [facility] Surgery 01/07/2023, 10:40 AM Please see Amion for pager number during day hours 7:00am-4:30pm

## 2023-01-07 NOTE — Consult Note (Signed)
WOC Nurse Consult Note: Reason for Consult: placement of NPWT dressing to midline surgical wound Patient had NPWT to wound last week post operatively but it was stopped on 1/12 due to some bleeding issues/clogging of the tubing. Verified with [facility] orders to replace today  Wound type: surgical  Pressure Injury POA: NA Measurement: 19cm x 8cm x 5cm  Wound HRC:BULAG, subcutaneous tissue Drainage (amount, consistency, odor) serosanguinous  Periwound: intact  Dressing procedure/placement/frequency: Filled wound with  __2_ piece of black foam Sealed NPWT dressing at 140mm HG Patient received POpain medication per bedside nurse prior to dressing change Patient tolerated procedure well   [place] nurse will continue to provide NPWT dressing changed due to the complexity of the dressing change.    [place], [place], [place]

## 2023-01-07 NOTE — Progress Notes (Signed)
Progress Note   Patient: Timothy Macias QIO:[id number] DOB: 1961/11/30 DOA: 12/28/2022     10 DOS: the patient was seen and examined on 01/07/2023   Brief hospital course: Fed Timothy Macias  is a 62 y.o. male, hx of HFmrEF (45%), OSA on CPAP, persistent A-fib, on Toprol-XL and Eliquis, CHF, hx of osteomyelitis and Bell's Palsy, 3 of recurrent A-fib in the past for which he required cardioversion, most recent during hospital stay at [facility] April 2022),. -Patient's presents to ED secondary to palpitation, rapid heart rate and worsening shortness of breath, he does report fluid retention/weight gain 43 pounds over last 1 to 2 weeks, reports he has been compliant with his Lasix, report intermittent palpitation, he knew it was A-fib, has been going on since Christmas, initially intermittent, but currently more consistent, he does report worsening dyspnea, worsening lower extremity edema, he does report he is compliant with Eliquis and amiodarone, as well he is compliant with his Lasix 80 mg oral twice daily, but report it has not been helping with his fluid retention   ED: He was 87% on room air, started on 3 L oxygen nasal cannula, and A-fib with RVR heart rate in the 150s, troponins were within normal limit x 2, blood pressure has been soft, magnesium was 1.8, potassium 3.5, creatinine was elevated at 2.4, well his hemoglobin was elevated at 17.3, EDP discussed with cardiology at [facility] Dr. Domenic Polite, who recommended initiation of amiodarone drip (patient reported Cardizem drip has not worked for him in the past), Requested to admit to [facility] as no cardiology coverage at [facility] over the weekend.  1/6 admitted 1/7 amiodarone infusion, IVF, NGT placed to decompress abdomen. Transferring to ICU. 1/9 DCCV failed due to ongoing bowel obstruction. 1/10 underwent laparotomy with lysis of adhesions 1/11 extubated 1/12 blood from incision with heparin restart.  Assessment  and Plan: SBO -General Surgery following. Pt is now s/p exlap with LOA and SBR 1/10 -recs for VAC and mobilize with PT -CIR following for possible CIR admit -Advancing diet per Gen Surg  Acute on chronic hypercapnic hypoxic respiratory failure -Pulm following -cont bipap qhs and PRN -wean O2 as tolerated  Obesity -Recommend diet/lifestyle modification  Afib -rate controlled at this time -on eliquis post-op but later stopped secondary to concerns of bleeding at op site -Now on prophylactic heparin -Would resume anticoagulation when OK with surgery  ARF -renal function improved with Cr 1.19 -cont to avoid nephrotoxic agents -Recheck bmet in AM  NASH -Follow LFT trends -Thus far stable  HTN -BP stable and controlled -cont current regimen  Hypokalemia -Will replace -recheck bmet in AM      Subjective: Without complaints today. Eager to advance diet  Physical Exam: Vitals:   01/07/23 0005 01/07/23 0412 01/07/23 0729 01/07/23 0934  BP:  125/81 115/78   Pulse:  91 99   Resp: 13 15 18    Temp:  97.9 F (36.6 C)    TempSrc:  Oral    SpO2:   97% 98%  Weight:      Height:       General exam: Awake, laying in bed, in nad Respiratory system: Normal respiratory effort, no wheezing Cardiovascular system: regular rate, s1, s2 Gastrointestinal system: Soft, nondistended, abd dressings in place Central nervous system: CN2-12 grossly intact, strength intact Extremities: Perfused, no clubbing Skin: Normal skin turgor, no notable skin lesions seen Psychiatry: Mood normal // no visual hallucinations   Data Reviewed:  Labs reviewed: Na 134,  K 3.0, Cr 1.19   Family Communication: Pt in room, family at bedside  Disposition: Status is: Inpatient Remains inpatient appropriate because: Severity of illness  Planned Discharge Destination: Rehab     Author: Marylu Lund, MD 01/07/2023 2:30 PM  For on call review [email or web address].

## 2023-01-07 NOTE — Progress Notes (Signed)
Rounding Note    Patient Name: Timothy Macias Date of Encounter: 01/07/2023  [place] Cardiologist: Quentin Ore and [facility] Subjective   No chest pain, just finishing bath no SOB  Inpatient Medications    Scheduled Meds:  (feeding supplement) PROSource Plus  30 mL Oral TID BM   amiodarone  400 mg Oral Daily   Chlorhexidine Gluconate Cloth  6 each Topical Daily   feeding supplement  1 Container Oral TID BM   heparin injection (subcutaneous)  5,000 Units Subcutaneous Q8H   metoprolol succinate  100 mg Oral Daily   pantoprazole (PROTONIX) IV  40 mg Intravenous Q24H   potassium chloride  60 mEq Oral Q4H   revefenacin  175 mcg Nebulization Daily   tamsulosin  0.4 mg Oral Daily   Continuous Infusions:  PRN Meds: artificial tears, fentaNYL (SUBLIMAZE) injection, levalbuterol, liver oil-zinc oxide, menthol-cetylpyridinium, metoprolol tartrate, ondansetron (ZOFRAN) IV, mouth rinse, phenol, simethicone, traMADol-acetaminophen   Vital Signs    Vitals:   01/07/23 0005 01/07/23 0412 01/07/23 0729 01/07/23 0934  BP:  125/81 115/78   Pulse:  91 99   Resp: 13 15 18    Temp:  97.9 F (36.6 C)    TempSrc:  Oral    SpO2:   97% 98%  Weight:      Height:        Intake/Output Summary (Last 24 hours) at 01/07/2023 1013 Last data filed at 01/07/2023 0900 Gross per 24 hour  Intake 1240 ml  Output 1350 ml  Net -110 ml      01/07/2023   12:01 AM 01/06/2023    4:19 AM 01/05/2023    3:51 AM  Last 3 Weights  Weight (lbs) 359 lb 12.7 oz 363 lb 5.1 oz 371 lb 0.6 oz  Weight (kg) 163.2 kg 164.8 kg 168.3 kg      Telemetry    Mostly in SR with PACs - Personally Reviewed  ECG    No new - Personally Reviewed  Physical Exam   GEN: No acute distress.   Neck: No JVD, flat in bed  Cardiac: RRR, premature beats, no murmurs, rubs, or gallops.  Respiratory: Clear to auscultation bilaterally. GI: Soft, nontender, non-distended  MS: No edema; No deformity. Neuro:  Nonfocal   Psych: Normal affect   Labs    High Sensitivity Troponin:   Recent Labs  Lab 12/28/22 1400 12/28/22 1620 12/29/22 1556 12/29/22 1713  TROPONINIHS 9 8 7 7      Chemistry Recent Labs  Lab 01/03/23 0554 01/04/23 0656 01/05/23 0433 01/05/23 0630 01/07/23 0028  NA 136 135  --  138 134*  K 3.8 3.2*  --  3.3* 3.0*  CL 87* 89*  --  91* 95*  CO2 33* 34*  --  29 29  GLUCOSE 104* 84  --  83 97  BUN 39* 30*  --  37* 23  CREATININE 1.55* 1.48*  --  1.43* 1.19  CALCIUM 8.8* 8.6*  --  8.7* 8.5*  MG 2.2 2.0 2.0  --   --   PROT  --  5.6*  --   --   --   ALBUMIN  --  2.8*  --   --   --   AST  --  23  --   --   --   ALT  --  10  --   --   --   ALKPHOS  --  47  --   --   --   BILITOT  --  1.0  --   --   --   GFRNONAA 51* 53*  --  56* >60  ANIONGAP 16* 12  --  18* 10    Lipids  Recent Labs  Lab 01/03/23 0554  TRIG 127    Hematology Recent Labs  Lab 01/05/23 0433 01/06/23 0414 01/07/23 0028  WBC 15.1* 16.2* 14.8*  RBC 5.87* 6.20* 5.52  HGB 15.4 16.2 14.2  HCT 48.3 51.7 45.3  MCV 82.3 83.4 82.1  MCH 26.2 26.1 25.7*  MCHC 31.9 31.3 31.3  RDW 19.4* 19.9* 19.0*  PLT 195 153 229   Thyroid No results for input(s): "TSH", "FREET4" in the last 168 hours.  BNP Recent Labs  Lab 01/01/23 0210  BNP 82.1    DDimer No results for input(s): "DDIMER" in the last 168 hours.   Radiology    No results found.  Cardiac Studies   Echo 12/30/22 IMPRESSIONS     1. Poor acoustic windows. Non-diagnsotic study. Consider repeat Echo with  contrast at normal heart rate.   Comparison(s): No prior Echocardiogram.   FINDINGS   Left Ventricle: Poor acoustic windows. Non-diagnsotic study. Consider  repeat Echo with contrast at normal heart rate.     LEFT VENTRICLE  PLAX 2D  LVIDd:         4.90 cm   Diastology  LVIDs:         3.60 cm   LV e' medial:    6.02 cm/s  LV PW:         1.20 cm   LV E/e' medial:  15.2  LV IVS:        1.20 cm   LV e' lateral:   10.13 cm/s  LVOT diam:      2.50 cm   LV E/e' lateral: 9.0  LVOT Area:     4.91 cm     LEFT ATRIUM           Index  LA diam:      4.00 cm 1.48 cm/m  LA Vol (A4C): 75.3 ml 27.89 ml/m                         PULMONIC VALVE  AORTA                 PV Vmax:       1.25 m/s  Ao Root diam: 3.90 cm PV Peak grad:  6.2 mmHg  Ao Asc diam:  3.60 cm     MITRAL VALVE  MV Area (PHT): 5.16 cm    SHUNTS  MV Decel Time: 147 msec    Systemic Diam: 2.50 cm  MR Peak grad: 10.0 mmHg  MR Vmax:      158.00 cm/s  MV E velocity: 91.57 cm/s  MV A velocity: 34.10 cm/s  MV E/A ratio:  2.69    Patient Profile     62 y.o. male  hx of HFmrEF (45%), OSA on CPAP, persistent A-fib, on Toprol-XL and Eliquis, CHF, hx of osteomyelitis and Bell's Palsy, 3 of recurrent A-fib in the past for which he required cardioversion, most recent during hospital stay at [facility] April 2022), super morbid obesity    Assessment & Plan    Chronic atrial fib- though now freq SR --on eliquis --on amiodarone -po  --oral lopressor started ok per surgery --SR off and on last 3 days.  --hx [facility] 01/01/23 with ERAF no plans to repeat this admit.  Chronic diastolic CHF NYHA  III-IV. Symptoms related to HF and weight. Daily weights. Keep K>4, Mg>2 Add lasix back after taking PO better Home dose was 80 mg daily I/O neg 14,807 since admit and wt down from 176.8 to 163.2 Kg  (29.9 lbs)  Morbid obesity  --understands he would benefit from wt loss.    BO status post laparotomy and lysis of adhesions.  Improving bowel function   HTN controlled        For questions or updates, please contact [place] Please consult [email or web address] for contact info under        Signed, Cecilie Kicks, NP  01/07/2023, 10:13 AM

## 2023-01-07 NOTE — Progress Notes (Signed)
Occupational Therapy Treatment Patient Details Name: Timothy Macias MRN: [id number] DOB: January 15, 1961 Today's Date: 01/07/2023   History of present illness Pt is a 62 y.o. M who presents 12/30/2022 with SBO, afib/RVR, acute on chronic hypercapnea. Pt underwent cardioversion on 1/09. Underwent exploratory laparotomy for lysis of adhesions and small bowel resection on 1/10.  Significant PMH: Afib, HTN, previous SBO due to strangulated peri-umbilical hernia s/p ex-lap, OSA on CPAP.   OT comments  Pt tolerated two hour in chair before he became uncomfortable and needed to return to supine. More assisted needed from lower surface of chair to get on his feet (+2 Mod), but once standing, he transferred back to bed with min assist with bari RW. +2 max assist for bed mobility using log roll technique. Pt with continued stable VS on RA.    Recommendations for follow up therapy are one component of a multi-disciplinary discharge planning process, led by the attending physician.  Recommendations may be updated based on patient status, additional functional criteria and insurance authorization.    Follow Up Recommendations  Acute inpatient rehab (3hours/day)     Assistance Recommended at Discharge Frequent or constant Supervision/Assistance  Patient can return home with the following  A lot of help with bathing/dressing/bathroom;Assistance with cooking/housework;Assistance with feeding;Direct supervision/assist for medications management;Direct supervision/assist for financial management;Assist for transportation;Help with stairs or ramp for entrance;Two people to help with walking and/or transfers   Equipment Recommendations  None recommended by OT    Recommendations for Other Services      Precautions / Restrictions Precautions Precautions: Fall;Other (comment) Precaution Comments: abdominal wound Required Braces or Orthoses: Other Brace Other Brace: bilat AFO's Restrictions Weight Bearing  Restrictions: No       Mobility Bed Mobility Overal bed mobility: Needs Assistance Bed Mobility: Sit to Sidelying, Rolling Rolling: Supervision Sidelying to sit: +2 for physical assistance, Mod assist     Sit to sidelying: +2 for physical assistance, Max assist General bed mobility comments: cues for technique, assist to guide shoulders and for LEs back into bed    Transfers Overall transfer level: Needs assistance Equipment used: Rolling walker (2 wheels) Transfers: Sit to/from Stand, Bed to chair/wheelchair/BSC Sit to Stand: +2 physical assistance, Mod assist     Step pivot transfers: +2 physical assistance, Min assist     General transfer comment: assist to rise and stedy, 3rd person to stabilize walker     Balance Overall balance assessment: Needs assistance   Sitting balance-Leahy Scale: Fair     Standing balance support: Bilateral upper extremity supported Standing balance-Leahy Scale: Poor                             ADL either performed or assessed with clinical judgement   ADL Overall ADL's : Needs assistance/impaired Eating/Feeding: Independent;Sitting Eating/Feeding Details (indicate cue type and reason): pt now on soft diet Grooming: Oral care;Sitting;Set up           Upper Body Dressing : Minimal assistance;Bed level   Lower Body Dressing: Total assistance;Bed level Lower Body Dressing Details (indicate cue type and reason): socks, shoes and AFOs                    Extremity/Trunk Assessment              Vision       Perception     Praxis      Cognition Arousal/Alertness: Awake/alert Behavior During Therapy: WFL for  tasks assessed/performed Overall Cognitive Status: Within Functional Limits for tasks assessed                                          Exercises      Shoulder Instructions       General Comments Pt is focused on abd incision but assured him to be careful with body  mechanics was only restriction    Pertinent Vitals/ Pain       Pain Assessment Pain Assessment: Faces Faces Pain Scale: Hurts even more Pain Location: abdomen Pain Descriptors / Indicators: Grimacing, Guarding, Aching Pain Intervention(s): Monitored during session, Repositioned  Home Living                                          Prior Functioning/Environment              Frequency  Min 2X/week        Progress Toward Goals  OT Goals(current goals can now be found in the care plan section)  Progress towards OT goals: Progressing toward goals  Acute Rehab OT Goals OT Goal Formulation: With patient Time For Goal Achievement: 01/15/23 Potential to Achieve Goals: Good  Plan Discharge plan remains appropriate    Co-evaluation    PT/OT/SLP Co-Evaluation/Treatment: Yes Reason for Co-Treatment: For patient/therapist safety   OT goals addressed during session: ADL's and self-care      AM-PAC OT "6 Clicks" Daily Activity     Outcome Measure   Help from another person eating meals?: None Help from another person taking care of personal grooming?: A Little Help from another person toileting, which includes using toliet, bedpan, or urinal?: Total Help from another person bathing (including washing, rinsing, drying)?: A Lot Help from another person to put on and taking off regular upper body clothing?: A Little Help from another person to put on and taking off regular lower body clothing?: Total 6 Click Score: 14    End of Session Equipment Utilized During Treatment: Rolling walker (2 wheels);Other (comment) (B AFOs)  OT Visit Diagnosis: Unsteadiness on feet (R26.81);Other abnormalities of gait and mobility (R26.89);Muscle weakness (generalized) (M62.81);Pain   Activity Tolerance Patient tolerated treatment well   Patient Left in bed;with call bell/phone within reach;with family/visitor present   Nurse Communication Mobility status         Time: 0762-2633 OT Time Calculation (min): 20 min  Charges: OT General Charges $OT Visit: 1 Visit OT Treatments $Self Care/Home Management : 8-22 mins $Therapeutic Activity: 8-22 mins Cleta Alberts, OTR/L Acute Rehabilitation Services Office: [phone]   [name] 01/07/2023, 1:12 PM

## 2023-01-07 NOTE — Progress Notes (Signed)
Inpatient Rehab Coordinator Note:  I spoke with patient and his spouse over the phone to discuss CIR recommendations and goals/expectations of CIR stay.  We reviewed 3 hrs/day of therapy, physician follow up, and average length of stay 2 weeks (dependent upon progress) with goals of supervision to mod I.  Spouse works as a IT trainer but is able to take some time through [employer] to provide 24/7 supervision at discharge if needed.  She confirms she was helping with LB dressing (specifically socks/AFOs/shoes), bathing, and some mobility at baseline.  We discussed insurance and need for prior auth and I will start that as soon as PT has a chance to work with patient and put a note in today.     Shann Medal, PT, DPT Admissions Coordinator [phone] 01/07/23  12:18 PM

## 2023-01-08 ENCOUNTER — Encounter (HOSPITAL_COMMUNITY): Payer: Self-pay | Admitting: Physical Medicine and Rehabilitation

## 2023-01-08 ENCOUNTER — Inpatient Hospital Stay (HOSPITAL_COMMUNITY)
Admission: RE | Admit: 2023-01-08 | Discharge: 2023-01-19 | DRG: 945 | Disposition: A | Discharge status: Home Health | Payer: BC Managed Care – PPO | Source: Intra-hospital | Attending: Physical Medicine and Rehabilitation | Admitting: Physical Medicine and Rehabilitation

## 2023-01-08 ENCOUNTER — Other Ambulatory Visit: Payer: Self-pay

## 2023-01-08 DIAGNOSIS — M21372 Foot drop, left foot: Secondary | ICD-10-CM | POA: Diagnosis present

## 2023-01-08 DIAGNOSIS — H02402 Unspecified ptosis of left eyelid: Secondary | ICD-10-CM | POA: Diagnosis not present

## 2023-01-08 DIAGNOSIS — I4819 Other persistent atrial fibrillation: Secondary | ICD-10-CM | POA: Diagnosis present

## 2023-01-08 DIAGNOSIS — J449 Chronic obstructive pulmonary disease, unspecified: Secondary | ICD-10-CM | POA: Diagnosis present

## 2023-01-08 DIAGNOSIS — F32A Depression, unspecified: Secondary | ICD-10-CM | POA: Diagnosis present

## 2023-01-08 DIAGNOSIS — M25512 Pain in left shoulder: Secondary | ICD-10-CM | POA: Diagnosis present

## 2023-01-08 DIAGNOSIS — M21371 Foot drop, right foot: Secondary | ICD-10-CM | POA: Diagnosis present

## 2023-01-08 DIAGNOSIS — L304 Erythema intertrigo: Secondary | ICD-10-CM | POA: Insufficient documentation

## 2023-01-08 DIAGNOSIS — Z79899 Other long term (current) drug therapy: Secondary | ICD-10-CM | POA: Diagnosis not present

## 2023-01-08 DIAGNOSIS — E876 Hypokalemia: Secondary | ICD-10-CM | POA: Diagnosis not present

## 2023-01-08 DIAGNOSIS — I130 Hypertensive heart and chronic kidney disease with heart failure and stage 1 through stage 4 chronic kidney disease, or unspecified chronic kidney disease: Secondary | ICD-10-CM | POA: Diagnosis present

## 2023-01-08 DIAGNOSIS — Z6841 Body Mass Index (BMI) 40.0 and over, adult: Secondary | ICD-10-CM

## 2023-01-08 DIAGNOSIS — Z87891 Personal history of nicotine dependence: Secondary | ICD-10-CM

## 2023-01-08 DIAGNOSIS — Z7901 Long term (current) use of anticoagulants: Secondary | ICD-10-CM

## 2023-01-08 DIAGNOSIS — K760 Fatty (change of) liver, not elsewhere classified: Secondary | ICD-10-CM | POA: Diagnosis present

## 2023-01-08 DIAGNOSIS — K219 Gastro-esophageal reflux disease without esophagitis: Secondary | ICD-10-CM | POA: Diagnosis present

## 2023-01-08 DIAGNOSIS — Z79890 Hormone replacement therapy (postmenopausal): Secondary | ICD-10-CM

## 2023-01-08 DIAGNOSIS — M1990 Unspecified osteoarthritis, unspecified site: Secondary | ICD-10-CM | POA: Diagnosis present

## 2023-01-08 DIAGNOSIS — R300 Dysuria: Secondary | ICD-10-CM | POA: Diagnosis not present

## 2023-01-08 DIAGNOSIS — Z96641 Presence of right artificial hip joint: Secondary | ICD-10-CM | POA: Diagnosis present

## 2023-01-08 DIAGNOSIS — Z741 Need for assistance with personal care: Secondary | ICD-10-CM | POA: Diagnosis present

## 2023-01-08 DIAGNOSIS — Z87442 Personal history of urinary calculi: Secondary | ICD-10-CM

## 2023-01-08 DIAGNOSIS — R29810 Facial weakness: Secondary | ICD-10-CM | POA: Diagnosis not present

## 2023-01-08 DIAGNOSIS — N281 Cyst of kidney, acquired: Secondary | ICD-10-CM | POA: Diagnosis not present

## 2023-01-08 DIAGNOSIS — G4733 Obstructive sleep apnea (adult) (pediatric): Secondary | ICD-10-CM | POA: Diagnosis present

## 2023-01-08 DIAGNOSIS — R269 Unspecified abnormalities of gait and mobility: Secondary | ICD-10-CM | POA: Diagnosis present

## 2023-01-08 DIAGNOSIS — S31109A Unspecified open wound of abdominal wall, unspecified quadrant without penetration into peritoneal cavity, initial encounter: Secondary | ICD-10-CM | POA: Insufficient documentation

## 2023-01-08 DIAGNOSIS — Z96651 Presence of right artificial knee joint: Secondary | ICD-10-CM | POA: Diagnosis present

## 2023-01-08 DIAGNOSIS — Z79891 Long term (current) use of opiate analgesic: Secondary | ICD-10-CM

## 2023-01-08 DIAGNOSIS — I509 Heart failure, unspecified: Secondary | ICD-10-CM

## 2023-01-08 DIAGNOSIS — D72829 Elevated white blood cell count, unspecified: Secondary | ICD-10-CM | POA: Diagnosis present

## 2023-01-08 DIAGNOSIS — I428 Other cardiomyopathies: Secondary | ICD-10-CM | POA: Diagnosis present

## 2023-01-08 DIAGNOSIS — K567 Ileus, unspecified: Secondary | ICD-10-CM | POA: Diagnosis present

## 2023-01-08 DIAGNOSIS — R3915 Urgency of urination: Secondary | ICD-10-CM | POA: Diagnosis present

## 2023-01-08 DIAGNOSIS — L308 Other specified dermatitis: Secondary | ICD-10-CM | POA: Diagnosis not present

## 2023-01-08 DIAGNOSIS — G629 Polyneuropathy, unspecified: Secondary | ICD-10-CM | POA: Diagnosis present

## 2023-01-08 DIAGNOSIS — G8929 Other chronic pain: Secondary | ICD-10-CM | POA: Diagnosis present

## 2023-01-08 DIAGNOSIS — I5022 Chronic systolic (congestive) heart failure: Secondary | ICD-10-CM | POA: Diagnosis present

## 2023-01-08 DIAGNOSIS — Z880 Allergy status to penicillin: Secondary | ICD-10-CM

## 2023-01-08 DIAGNOSIS — M48061 Spinal stenosis, lumbar region without neurogenic claudication: Secondary | ICD-10-CM | POA: Diagnosis present

## 2023-01-08 DIAGNOSIS — I480 Paroxysmal atrial fibrillation: Secondary | ICD-10-CM | POA: Diagnosis not present

## 2023-01-08 DIAGNOSIS — L89152 Pressure ulcer of sacral region, stage 2: Secondary | ICD-10-CM | POA: Diagnosis present

## 2023-01-08 DIAGNOSIS — Z888 Allergy status to other drugs, medicaments and biological substances status: Secondary | ICD-10-CM

## 2023-01-08 DIAGNOSIS — N401 Enlarged prostate with lower urinary tract symptoms: Secondary | ICD-10-CM | POA: Diagnosis present

## 2023-01-08 DIAGNOSIS — N200 Calculus of kidney: Secondary | ICD-10-CM | POA: Diagnosis not present

## 2023-01-08 DIAGNOSIS — M19012 Primary osteoarthritis, left shoulder: Secondary | ICD-10-CM | POA: Diagnosis not present

## 2023-01-08 DIAGNOSIS — K2100 Gastro-esophageal reflux disease with esophagitis, without bleeding: Secondary | ICD-10-CM | POA: Diagnosis present

## 2023-01-08 DIAGNOSIS — I4891 Unspecified atrial fibrillation: Secondary | ICD-10-CM | POA: Diagnosis not present

## 2023-01-08 DIAGNOSIS — I959 Hypotension, unspecified: Secondary | ICD-10-CM | POA: Diagnosis present

## 2023-01-08 DIAGNOSIS — R5381 Other malaise: Principal | ICD-10-CM | POA: Diagnosis present

## 2023-01-08 DIAGNOSIS — K56609 Unspecified intestinal obstruction, unspecified as to partial versus complete obstruction: Secondary | ICD-10-CM | POA: Diagnosis present

## 2023-01-08 LAB — COMPREHENSIVE METABOLIC PANEL
ALT: 25 U/L (ref 0–44)
AST: 28 U/L (ref 15–41)
Albumin: 2.7 g/dL — ABNORMAL LOW (ref 3.5–5.0)
Alkaline Phosphatase: 48 U/L (ref 38–126)
Anion gap: 8 (ref 5–15)
BUN: 21 mg/dL (ref 8–23)
CO2: 26 mmol/L (ref 22–32)
Calcium: 8.7 mg/dL — ABNORMAL LOW (ref 8.9–10.3)
Chloride: 99 mmol/L (ref 98–111)
Creatinine, Ser: 1.19 mg/dL (ref 0.61–1.24)
GFR, Estimated: >60 mL/min (ref >60)
Glucose, Bld: 110 mg/dL — ABNORMAL HIGH (ref 70–99)
Potassium: 4.2 mmol/L (ref 3.5–5.1)
Sodium: 133 mmol/L — ABNORMAL LOW (ref 135–145)
Total Bilirubin: 0.8 mg/dL (ref 0.3–1.2)
Total Protein: 5.5 g/dL — ABNORMAL LOW (ref 6.5–8.1)

## 2023-01-08 LAB — CBC
HCT: 43.8 % (ref 39.0–52.0)
Hemoglobin: 14.1 g/dL (ref 13.0–17.0)
MCH: 26.2 pg (ref 26.0–34.0)
MCHC: 32.2 g/dL (ref 30.0–36.0)
MCV: 81.4 fL (ref 80.0–100.0)
Platelets: 243 10*3/uL (ref 150–400)
RBC: 5.38 MIL/uL (ref 4.22–5.81)
RDW: 18.8 % — ABNORMAL HIGH (ref 11.5–15.5)
WBC: 15.3 10*3/uL — ABNORMAL HIGH (ref 4.0–10.5)
nRBC: 0 % (ref 0.0–0.2)

## 2023-01-08 LAB — MAGNESIUM: Magnesium: 1.7 mg/dL (ref 1.7–2.4)

## 2023-01-08 MED ORDER — REVEFENACIN 175 MCG/3ML IN SOLN
175.0000 ug | Freq: Every day | RESPIRATORY_TRACT | Status: DC
  Administered 2023-01-09 – 2023-01-18 (×6): 175 ug via RESPIRATORY_TRACT
  Filled 2023-01-08 (×11): qty 3

## 2023-01-08 MED ORDER — LOSARTAN POTASSIUM 25 MG PO TABS
12.5000 mg | ORAL_TABLET | Freq: Every day | ORAL | Status: DC
  Administered 2023-01-08: 12.5 mg via ORAL
  Filled 2023-01-08: qty 1

## 2023-01-08 MED ORDER — FLEET ENEMA 7-19 GM/118ML RE ENEM: 1.0000 | ENEMA | Freq: Once | RECTAL | Status: DC | PRN

## 2023-01-08 MED ORDER — PROCHLORPERAZINE 25 MG RE SUPP: 12.5000 mg | Freq: Four times a day (QID) | RECTAL | Status: DC | PRN

## 2023-01-08 MED ORDER — CALCIUM CARBONATE ANTACID 500 MG PO CHEW
1.0000 | CHEWABLE_TABLET | Freq: Three times a day (TID) | ORAL | Status: DC
  Administered 2023-01-09 – 2023-01-19 (×31): 200 mg via ORAL
  Filled 2023-01-08 (×31): qty 1

## 2023-01-08 MED ORDER — AMIODARONE HCL 200 MG PO TABS
400.0000 mg | ORAL_TABLET | Freq: Every day | ORAL | Status: DC
  Administered 2023-01-08 – 2023-01-18 (×11): 400 mg via ORAL
  Filled 2023-01-08 (×11): qty 2

## 2023-01-08 MED ORDER — PROSOURCE PLUS PO LIQD
30.0000 mL | Freq: Three times a day (TID) | ORAL | Status: DC
  Administered 2023-01-09 – 2023-01-19 (×27): 30 mL via ORAL
  Filled 2023-01-08 (×30): qty 30

## 2023-01-08 MED ORDER — METOPROLOL SUCCINATE ER 50 MG PO TB24
100.0000 mg | ORAL_TABLET | Freq: Every day | ORAL | Status: DC
  Administered 2023-01-09 – 2023-01-13 (×5): 100 mg via ORAL
  Filled 2023-01-08 (×7): qty 2

## 2023-01-08 MED ORDER — GUAIFENESIN-DM 100-10 MG/5ML PO SYRP: 5.0000 mL | ORAL_SOLUTION | Freq: Four times a day (QID) | ORAL | Status: DC | PRN

## 2023-01-08 MED ORDER — PROSOURCE PLUS PO LIQD: 30.0000 mL | Freq: Three times a day (TID) | ORAL | Status: DC

## 2023-01-08 MED ORDER — TRAMADOL HCL 50 MG PO TABS: 50.0000 mg | ORAL_TABLET | Freq: Four times a day (QID) | ORAL | Status: DC | PRN

## 2023-01-08 MED ORDER — DIPHENHYDRAMINE HCL 12.5 MG/5ML PO ELIX: 12.5000 mg | ORAL_SOLUTION | Freq: Four times a day (QID) | ORAL | Status: DC | PRN

## 2023-01-08 MED ORDER — ALUM & MAG HYDROXIDE-SIMETH 200-200-20 MG/5ML PO SUSP: 30.0000 mL | ORAL | Status: DC | PRN

## 2023-01-08 MED ORDER — TAMSULOSIN HCL 0.4 MG PO CAPS: 0.4000 mg | ORAL_CAPSULE | Freq: Every day | ORAL | Status: DC

## 2023-01-08 MED ORDER — PANTOPRAZOLE SODIUM 40 MG PO TBEC
40.0000 mg | DELAYED_RELEASE_TABLET | Freq: Every day | ORAL | Status: DC
  Administered 2023-01-09 – 2023-01-18 (×10): 40 mg via ORAL
  Filled 2023-01-08 (×10): qty 1

## 2023-01-08 MED ORDER — HEPARIN SODIUM (PORCINE) 5000 UNIT/ML IJ SOLN: 5000.0000 [IU] | Freq: Three times a day (TID) | INTRAMUSCULAR | Status: DC

## 2023-01-08 MED ORDER — DULOXETINE HCL 60 MG PO CPEP
60.0000 mg | ORAL_CAPSULE | Freq: Two times a day (BID) | ORAL | Status: DC
  Administered 2023-01-08 – 2023-01-19 (×22): 60 mg via ORAL
  Filled 2023-01-08 (×23): qty 1

## 2023-01-08 MED ORDER — REVEFENACIN 175 MCG/3ML IN SOLN: 175.0000 ug | Freq: Every day | RESPIRATORY_TRACT | Status: DC

## 2023-01-08 MED ORDER — METHOCARBAMOL 500 MG PO TABS
500.0000 mg | ORAL_TABLET | Freq: Four times a day (QID) | ORAL | Status: DC | PRN
  Administered 2023-01-08 – 2023-01-12 (×6): 500 mg via ORAL
  Filled 2023-01-08 (×6): qty 1

## 2023-01-08 MED ORDER — PROCHLORPERAZINE EDISYLATE 10 MG/2ML IJ SOLN: 5.0000 mg | Freq: Four times a day (QID) | INTRAMUSCULAR | Status: DC | PRN

## 2023-01-08 MED ORDER — LEVALBUTEROL HCL 0.63 MG/3ML IN NEBU: 0.6300 mg | INHALATION_SOLUTION | Freq: Four times a day (QID) | RESPIRATORY_TRACT | 12 refills | Status: AC | PRN

## 2023-01-08 MED ORDER — PANTOPRAZOLE SODIUM 40 MG PO TBEC
40.0000 mg | DELAYED_RELEASE_TABLET | Freq: Every day | ORAL | Status: DC
  Administered 2023-01-08: 40 mg via ORAL
  Filled 2023-01-08: qty 1

## 2023-01-08 MED ORDER — CYCLOBENZAPRINE HCL 10 MG PO TABS: 10.0000 mg | ORAL_TABLET | Freq: Three times a day (TID) | ORAL | Status: DC | PRN

## 2023-01-08 MED ORDER — FUROSEMIDE 40 MG PO TABS
80.0000 mg | ORAL_TABLET | Freq: Every day | ORAL | Status: DC
  Administered 2023-01-09 – 2023-01-18 (×10): 80 mg via ORAL
  Filled 2023-01-08 (×11): qty 2

## 2023-01-08 MED ORDER — BOOST / RESOURCE BREEZE PO LIQD CUSTOM
1.0000 | Freq: Three times a day (TID) | ORAL | Status: DC
  Administered 2023-01-08 – 2023-01-14 (×9): 1 via ORAL

## 2023-01-08 MED ORDER — FUROSEMIDE 40 MG PO TABS
80.0000 mg | ORAL_TABLET | Freq: Every day | ORAL | Status: DC
  Administered 2023-01-08: 80 mg via ORAL
  Filled 2023-01-08: qty 2

## 2023-01-08 MED ORDER — TRAZODONE HCL 50 MG PO TABS
25.0000 mg | ORAL_TABLET | Freq: Every evening | ORAL | Status: DC | PRN
  Administered 2023-01-09 – 2023-01-17 (×7): 50 mg via ORAL
  Filled 2023-01-08 (×7): qty 1

## 2023-01-08 MED ORDER — SIMETHICONE 80 MG PO CHEW: 80.0000 mg | CHEWABLE_TABLET | Freq: Four times a day (QID) | ORAL | 0 refills | Status: DC | PRN

## 2023-01-08 MED ORDER — CALCIUM CARBONATE ANTACID 500 MG PO CHEW: 1.0000 | CHEWABLE_TABLET | Freq: Three times a day (TID) | ORAL | Status: AC | PRN

## 2023-01-08 MED ORDER — ACETAMINOPHEN 325 MG PO TABS
325.0000 mg | ORAL_TABLET | ORAL | Status: DC | PRN
  Administered 2023-01-08 – 2023-01-12 (×4): 650 mg via ORAL
  Filled 2023-01-08 (×4): qty 2

## 2023-01-08 MED ORDER — TAMSULOSIN HCL 0.4 MG PO CAPS
0.8000 mg | ORAL_CAPSULE | Freq: Every day | ORAL | Status: DC
  Administered 2023-01-09 – 2023-01-19 (×11): 0.8 mg via ORAL
  Filled 2023-01-08 (×11): qty 2

## 2023-01-08 MED ORDER — HEPARIN SODIUM (PORCINE) 5000 UNIT/ML IJ SOLN
5000.0000 [IU] | Freq: Three times a day (TID) | INTRAMUSCULAR | Status: DC
  Administered 2023-01-08 – 2023-01-10 (×6): 5000 [IU] via SUBCUTANEOUS
  Filled 2023-01-08 (×6): qty 1

## 2023-01-08 MED ORDER — MAGNESIUM HYDROXIDE 400 MG/5ML PO SUSP: 30.0000 mL | Freq: Every day | ORAL | Status: DC | PRN

## 2023-01-08 MED ORDER — ZINC OXIDE 40 % EX OINT: TOPICAL_OINTMENT | CUTANEOUS | Status: DC | PRN

## 2023-01-08 MED ORDER — SACCHAROMYCES BOULARDII 250 MG PO CAPS
250.0000 mg | ORAL_CAPSULE | Freq: Two times a day (BID) | ORAL | Status: DC
  Administered 2023-01-08 – 2023-01-19 (×22): 250 mg via ORAL
  Filled 2023-01-08 (×22): qty 1

## 2023-01-08 MED ORDER — CALCIUM CARBONATE ANTACID 500 MG PO CHEW
1.0000 | CHEWABLE_TABLET | Freq: Three times a day (TID) | ORAL | Status: DC | PRN
  Administered 2023-01-08: 200 mg via ORAL
  Filled 2023-01-08: qty 1

## 2023-01-08 MED ORDER — PREGABALIN 50 MG PO CAPS
100.0000 mg | ORAL_CAPSULE | Freq: Every day | ORAL | Status: DC
  Administered 2023-01-09 – 2023-01-19 (×11): 100 mg via ORAL
  Filled 2023-01-08 (×11): qty 2

## 2023-01-08 MED ORDER — FERROUS SULFATE 325 (65 FE) MG PO TABS
325.0000 mg | ORAL_TABLET | Freq: Every day | ORAL | Status: DC
  Administered 2023-01-09 – 2023-01-19 (×11): 325 mg via ORAL
  Filled 2023-01-08 (×11): qty 1

## 2023-01-08 MED ORDER — ZINC OXIDE 40 % EX OINT: TOPICAL_OINTMENT | CUTANEOUS | 0 refills | Status: AC | PRN

## 2023-01-08 MED ORDER — SORBITOL 70 % SOLN: 30.0000 mL | Freq: Every day | Status: DC | PRN

## 2023-01-08 MED ORDER — METOPROLOL SUCCINATE ER 100 MG PO TB24: 100.0000 mg | ORAL_TABLET | Freq: Every day | ORAL | Status: DC

## 2023-01-08 MED ORDER — OXYCODONE HCL 5 MG PO TABS
60.0000 mg | ORAL_TABLET | Freq: Three times a day (TID) | ORAL | Status: DC | PRN
  Administered 2023-01-08 – 2023-01-12 (×9): 60 mg via ORAL
  Filled 2023-01-08 (×9): qty 12

## 2023-01-08 MED ORDER — LOSARTAN POTASSIUM 25 MG PO TABS
12.5000 mg | ORAL_TABLET | Freq: Every day | ORAL | Status: DC
  Administered 2023-01-09 – 2023-01-14 (×6): 12.5 mg via ORAL
  Filled 2023-01-08 (×6): qty 0.5

## 2023-01-08 MED ORDER — PROCHLORPERAZINE MALEATE 5 MG PO TABS: 5.0000 mg | ORAL_TABLET | Freq: Four times a day (QID) | ORAL | Status: DC | PRN

## 2023-01-08 NOTE — Progress Notes (Addendum)
Inpatient Rehab Admissions Coordinator:   I received insurance approval for CIR, however I do not have a bed available for this patient to admit today.  I will continue to follow for admission pending bed availability.  Addendum 1026: I now have a bed available for this patient to admit today.  Dr. Wyline Copas and Barkley Boards, PA-C, in agreement.  Will let pt/family know.   Shann Medal, PT, DPT Admissions Coordinator [phone] 01/08/23  9:55 AM

## 2023-01-08 NOTE — Progress Notes (Signed)
Pt has a home cpap. Self adm

## 2023-01-08 NOTE — H&P (Signed)
Physical Medicine and Rehabilitation Admission H&P    CC: Debility secondary to SBO s/p laparotomy, HFrEF  HPI: Timothy Macias is a 45 year R handed old male who presented to his PCP on 12/29/2023 complaining of SOB and rapid weight gain with history of HFrEF and atrial fibrillation on Eliquis. Transported via EMS to [facility] ED. Found to be in rapid atrial fibrillation admitted to hospitalist service. Mild hypoxia supplemented with 3L O2 via [place]. Amiodarone infusion started. Labs consistent with AKI, elevated hemoglobin. Episode of emesis with nausea and lack of appetite. Transferred to [facility]. Lethargic and RR called on 1/06. A fib given dig, Lasix and BB. Cardiology consulted. HFrEF 45% secondary to NICM. Notes indicated alcohol intake is 6 beers daily for ~45 years.  Noted abdominal distention and wife stated the patient had undergone incarcerated open umbilical hernia repair at [facility] in 2023. KUB with dilated SB loops and general surgery consulted. NGT place with 3 liters of output. Eliquis held and CT abdomen and pelvis obtained. RR called 1/07 due to decreased LOC. Transferred to ICU and PCCM consulted. Eliquis held and heparin infusion started. Required cardioversion 1/09. No improvement in SBO with small bowel protocol and underwent exploratory laparotomy with lysis of adhesions and small bowel resection by Dr. Donne Hazel on 1/10. Fascia was closed and wound VAC placed. Remained on vent post-op. Skin edge bleeding vessel required suturing at bedside on 1/12. Heparin infusion stopped and heparin [place] started for DVT prophylaxis. NGT removed and given ice chips on 1/12. Diet advanced to soft. Restarted Lasix 80 mg and losartan. VAC dressing to be changed every M/W/F. To restart Eliquis when approved by surgical team. The patient requires inpatient physical medicine and rehabilitation evaluations and treatment secondary to dysfunction due to exploratory laparotomy, SBO, heart failure.  Primary  complaint is dyspepsia. Takes Protonix every evening.  Loose stools and requesting probiotics. Dose of Flmax at home is 0.8 mg daily.  Does not use any maintenance inhalers at home. Albuterol and Breztri cause unpleasant feeling.    PMH includes bilateral nephrolithiasis and bilateral renal cysts, depression, reflux esophagitis, peripheral neuropathy, lumbar spinal stenosis, OSA, arthritis, hypertension. Diagnosed with atrial fibrillation in 2016 and underwent ablation at [facility] in 2020. Diagnosed with HF in 2020 and follows with Dr. Jodelle Gross Assar. On chronic opioids, benzodiazepines per PCP, Dr. Gar Ponto.  Pt reports pain adequately controlled;  Pt reports has been on Oxycodone 60 mg TID for 2-3 years, but taking Oxy 30 mg tabs for 2-3 months- for chronic back and joint pain.  Has his CPAP from home to use.  Has B/L carbon AFOs from home- chronic for B/L severe peripheral neuropathy causing B/L Foot drop.   Notes hasn't been able to "find his penis for 3+ years" and pees standing over a bucket at home prior- so doesn't know how can use Urinal here.    Review of Systems  Constitutional:  Negative for chills and fever.  HENT:  Negative for congestion and sore throat.   Eyes: Negative.  Negative for photophobia.  Respiratory:  Negative for cough and shortness of breath.   Cardiovascular:  Negative for chest pain and palpitations.  Gastrointestinal:  Positive for heartburn. Negative for constipation, nausea and vomiting.       Loose stools  Genitourinary:  Negative for dysuria, hematuria and urgency.  Musculoskeletal:  Positive for joint pain and myalgias. Negative for falls.  Skin: Negative.   Neurological:  Negative for dizziness and headaches.  Endo/Heme/Allergies: Negative.  Psychiatric/Behavioral:  Negative for depression, hallucinations and suicidal ideas. The patient has insomnia.   All other systems reviewed and are negative.  Past Medical History:  Diagnosis Date   Atrial  fibrillation ([place])    CHF (congestive heart failure) ([place])    Fatty liver    Hypertension    Sleep apnea    wears CPAP   Past Surgical History:  Procedure Laterality Date   APPLICATION OF WOUND VAC N/A 01/02/2023   Procedure: APPLICATION OF WOUND VAC;  Surgeon: Rolm Bookbinder, MD;  Location: [place];  Service: General;  Laterality: N/A;   CARDIOVERSION N/A 01/01/2023   Procedure: CARDIOVERSION;  Surgeon: Skeet Latch, MD;  Location: [place];  Service: Cardiovascular;  Laterality: N/A;   HIP ARTHROPLASTY Right    KNEE ARTHROPLASTY Right    LAPAROTOMY N/A 01/02/2023   Procedure: EXPLORATORY LAPAROTOMY WITH LYSIS OF ADHESIONS WITH SMALL BOWEL RESECTION;  Surgeon: Rolm Bookbinder, MD;  Location: [place];  Service: General;  Laterality: N/A;   TONSILLECTOMY     WISDOM TOOTH EXTRACTION     Family History  Problem Relation Age of Onset   Bone cancer Mother    Other Father        MVA   Social History:  reports that he has quit smoking. His smoking use included cigarettes. He smoked an average of 1 pack per day. He has never used smokeless tobacco. He reports that he does not currently use alcohol. He reports that he does not use drugs. Allergies:  Allergies  Allergen Reactions   Albuterol Swelling   Amiodarone Rash   Penicillins Hives   Medications Prior to Admission  Medication Sig Dispense Refill   acetaminophen (TYLENOL) 500 MG tablet Take 1,000 mg by mouth every 8 (eight) hours as needed for mild pain.     amiodarone (PACERONE) 200 MG tablet Take 400 mg by mouth daily.     apixaban (ELIQUIS) 5 MG TABS tablet Take 5 mg by mouth daily.     cyclobenzaprine (FLEXERIL) 10 MG tablet Take 10 mg by mouth 3 (three) times daily as needed for muscle spasms.     diphenhydramine-acetaminophen (TYLENOL PM) 25-500 MG TABS tablet Take 2 tablets by mouth at bedtime as needed.     DULoxetine (CYMBALTA) 60 MG capsule Take 60 mg by mouth 2 (two) times daily.     ferrous sulfate 325 (65 FE)  MG EC tablet Take 325 mg by mouth daily with breakfast.     folic acid (FOLVITE) 1 MG tablet Take 1 mg by mouth daily.     furosemide (LASIX) 80 MG tablet Take 80 mg by mouth daily.     LORazepam (ATIVAN) 0.5 MG tablet Take 0.5 mg by mouth every 8 (eight) hours as needed for anxiety.     losartan (COZAAR) 25 MG tablet Take 12.5 mg by mouth daily.     Magnesium 400 MG TABS Take 400 mg by mouth daily.     melatonin 5 MG TABS Take 5-10 mg by mouth at bedtime as needed (sleep).     metoprolol succinate (TOPROL-XL) 25 MG 24 hr tablet Take 75 mg by mouth daily.     Multiple Vitamins-Minerals (YOUR LIFE MULTI ADULT GUMMIES) CHEW Chew by mouth.     oxycodone (ROXICODONE) 30 MG immediate release tablet Take 60 mg by mouth 3 (three) times daily as needed (pain).     pantoprazole (PROTONIX) 40 MG tablet Take 1 tablet by mouth 2 (two) times daily.     potassium  chloride SA (K-DUR) 20 MEQ tablet Take 40 mEq by mouth daily.     pregabalin (LYRICA) 100 MG capsule Take 100 mg by mouth at bedtime.     promethazine (PHENERGAN) 25 MG tablet Take 25 mg by mouth every 6 (six) hours as needed for nausea or vomiting.     SUMAtriptan (IMITREX) 100 MG tablet Take 100 mg by mouth every 2 (two) hours as needed for migraine.     tamsulosin (FLOMAX) 0.4 MG CAPS capsule Take 0.8 mg by mouth daily.     testosterone cypionate (DEPOTESTOSTERONE CYPIONATE) 200 MG/ML injection Inject 200 mg into the muscle once a week.        Home: Home Living Family/patient expects to be discharged to:: Private residence Living Arrangements: Spouse/significant other Available Help at Discharge: Family, Available PRN/intermittently Type of Home: House Home Access: [place]: One level Bathroom Shower/Tub: Multimedia programmer: Handicapped height [place]: [place] in, Museum/gallery curator bars - tub/shower, Other (comment), Wheelchair - manual, Cane - single point, BSC/3in1, Conservation officer, nature (2 wheels),  Rollator (4 wheels)   Functional History: Prior Function Prior Level of Function : Needs assist Mobility Comments: walks with bari RW, wife assists with bed mobility, uses WC outside of house ADLs Comments: wife assists with bathing and LB dressing  Functional Status:  Mobility: Bed Mobility Overal bed mobility: Needs Assistance Bed Mobility: Sit to Sidelying, Rolling Rolling: Supervision Sidelying to sit: +2 for physical assistance, +2 for safety/equipment, Mod assist Supine to sit: Mod assist, +2 for physical assistance Sit to sidelying: +2 for physical assistance, Max assist General bed mobility comments: cues for technique, assist to guide shoulders and for LEs back into bed Transfers Overall transfer level: Needs assistance Equipment used: Rolling walker (2 wheels) Transfers: Sit to/from Stand, Bed to chair/wheelchair/BSC Sit to Stand: +2 physical assistance, Mod assist Bed to/from chair/wheelchair/BSC transfer type:: Step pivot Step pivot transfers: +2 physical assistance, Min assist Transfer via Lift Equipment: [place] transfer comment: assist to rise and stedy, 3rd person to stabilize walker Ambulation/Gait General Gait Details: unable to take steps more than to transition to chair Gait velocity: reduced Gait velocity interpretation: <1.31 ft/sec, indicative of household ambulator Pre-gait activities: standing balance and posture cues    ADL: ADL Overall ADL's : Needs assistance/impaired Eating/Feeding: Independent, Sitting Eating/Feeding Details (indicate cue type and reason): pt now on soft diet Grooming: Oral care, Sitting, Set up Upper Body Bathing: Minimal assistance, Standing Lower Body Bathing: Maximal assistance, Sit to/from stand Upper Body Dressing : Minimal assistance, Bed level Lower Body Dressing: Total assistance, Bed level Lower Body Dressing Details (indicate cue type and reason): socks, shoes and AFOs Toilet Transfer: Minimal assistance,  +2 for safety/equipment, +2 for physical assistance, BSC/3in1, Stand-pivot Toileting- Clothing Manipulation and Hygiene: Maximal assistance, Sit to/from stand Functional mobility during ADLs: Moderate assistance, +2 for physical assistance, +2 for safety/equipment General ADL Comments: assist due to body habitus, elevated HR< poor activity tolerance, NG and [place] tubing  Cognition: Cognition Overall Cognitive Status: Within Functional Limits for tasks assessed Orientation Level: Oriented X4 Cognition Arousal/Alertness: Awake/alert Behavior During Therapy: WFL for tasks assessed/performed Overall Cognitive Status: Within Functional Limits for tasks assessed  Physical Exam: Blood pressure 134/78, pulse 76, temperature 98 F (36.7 C), temperature source Oral, resp. rate (!) 25, height 5\' 8"  (1.727 m), weight (!) 167.7 kg, SpO2 95 %. Physical Exam Vitals and nursing note reviewed. Exam conducted with a chaperone present.  Constitutional:  General: He is not in acute distress.    Appearance: He is obese. He is not ill-appearing.     Comments: Pt sitting up in bed; wife and father in law at bedside; pt has BMI of 53.77 as of today; alert, awake, appropriate, NAD  HENT:     Head: Normocephalic and atraumatic.     Right Ear: External ear normal.     Left Ear: External ear normal.     Nose: Nose normal. No congestion.     Mouth/Throat:     Mouth: Mucous membranes are dry.     Pharynx: No oropharyngeal exudate.  Eyes:     General:        Right eye: No discharge.        Left eye: No discharge.  Cardiovascular:     Rate and Rhythm: Normal rate and regular rhythm.     Heart sounds: Normal heart sounds. No murmur heard.    No gallop.  Pulmonary:     Effort: Pulmonary effort is normal. No respiratory distress.     Breath sounds: Normal breath sounds. No wheezing, rhonchi or rales.     Comments: CTA B/L however with any exertion (I.e exam), started wheezing immediately- dislikes  albuterol Abdominal:     General: Bowel sounds are normal.     Comments: Wound VAC in place with good seal. Minimal, thin cherry colored output Incision is vertical to umbilicus with VAC in place- appears to have good granulating tissue underneath that can be seen Firm, rotund abd; NT; protuberant but not distended; normoactive BS  Genitourinary:    Comments: Male purewick in place Musculoskeletal:     Cervical back: Neck supple. No tenderness.     Comments: Ues 4th/5th digit of L hand flexed at MCP and PIP Ue's 5-/5 in arms except FA 4-/5 B/L  LE's HF 4-/5; KE 4+/5 B/L, DF 2-/5 and PF 2-/5 B/L B/L foot drop  Skin:    General: Skin is warm and dry.     Comments: Dry rough patch on R heel; trace bogginess of B/L heels- dressing on heels for prevention Needs PRAFO's.  Has mild MASD between legs, skin folds and groin  Neurological:     Mental Status: He is alert and oriented to person, place, and time.     Sensory: Sensory deficit present.     Motor: Weakness present.     Comments: Severe neuropathy in legs B/L Has absent sensation to light touch to knees B/L- with foot atrophy B/L However still very decreased sensation to light touch from L1 to L2 B/L- absent L3-S2 B/L   Psychiatric:        Mood and Affect: Mood normal.        Behavior: Behavior normal.     Results for orders placed or performed during the hospital encounter of 12/28/22 (from the past 48 hour(s))  Glucose, capillary     Status: None   Collection Time: 01/06/23 11:30 AM  Result Value Ref Range   Glucose-Capillary 95 70 - 99 mg/dL    Comment: Glucose reference range applies only to samples taken after fasting for at least 8 hours.  Glucose, capillary     Status: Abnormal   Collection Time: 01/06/23  4:38 PM  Result Value Ref Range   Glucose-Capillary 117 (H) 70 - 99 mg/dL    Comment: Glucose reference range applies only to samples taken after fasting for at least 8 hours.  Glucose, capillary     Status:  Abnormal   Collection Time: 01/06/23  7:46 PM  Result Value Ref Range   Glucose-Capillary 133 (H) 70 - 99 mg/dL    Comment: Glucose reference range applies only to samples taken after fasting for at least 8 hours.  Glucose, capillary     Status: None   Collection Time: 01/07/23 12:00 AM  Result Value Ref Range   Glucose-Capillary 97 70 - 99 mg/dL    Comment: Glucose reference range applies only to samples taken after fasting for at least 8 hours.   Comment 1 Notify RN    Comment 2 Document in Chart   CBC     Status: Abnormal   Collection Time: 01/07/23 12:28 AM  Result Value Ref Range   WBC 14.8 (H) 4.0 - 10.5 K/uL   RBC 5.52 4.22 - 5.81 MIL/uL   Hemoglobin 14.2 13.0 - 17.0 g/dL   HCT 45.3 39.0 - 52.0 %   MCV 82.1 80.0 - 100.0 fL   MCH 25.7 (L) 26.0 - 34.0 pg   MCHC 31.3 30.0 - 36.0 g/dL   RDW 19.0 (H) 11.5 - 15.5 %   Platelets 229 150 - 400 K/uL   nRBC 0.0 0.0 - 0.2 %    Comment: Performed at [facility], [place] [address], [place], [place] [id number]  Basic metabolic panel     Status: Abnormal   Collection Time: 01/07/23 12:28 AM  Result Value Ref Range   Sodium 134 (L) 135 - 145 mmol/L   Potassium 3.0 (L) 3.5 - 5.1 mmol/L   Chloride 95 (L) 98 - 111 mmol/L   CO2 29 22 - 32 mmol/L   Glucose, Bld 97 70 - 99 mg/dL    Comment: Glucose reference range applies only to samples taken after fasting for at least 8 hours.   BUN 23 8 - 23 mg/dL   Creatinine, Ser 1.19 0.61 - 1.24 mg/dL   Calcium 8.5 (L) 8.9 - 10.3 mg/dL   GFR, Estimated >60 >60 mL/min    Comment: (NOTE) Calculated using the CKD-EPI Creatinine Equation (2021)    Anion gap 10 5 - 15    Comment: Performed at [facility] [address] [place], [place] [address]  Glucose, capillary     Status: Abnormal   Collection Time: 01/07/23  4:13 AM  Result Value Ref Range   Glucose-Capillary 114 (H) 70 - 99 mg/dL    Comment: Glucose reference range applies only to samples taken after fasting for at least 8 hours.    Comment 1 Notify RN    Comment 2 Document in Chart   Glucose, capillary     Status: None   Collection Time: 01/07/23  8:30 AM  Result Value Ref Range   Glucose-Capillary 81 70 - 99 mg/dL    Comment: Glucose reference range applies only to samples taken after fasting for at least 8 hours.  Comprehensive metabolic panel     Status: Abnormal   Collection Time: 01/08/23 12:54 AM  Result Value Ref Range   Sodium 133 (L) 135 - 145 mmol/L   Potassium 4.2 3.5 - 5.1 mmol/L   Chloride 99 98 - 111 mmol/L   CO2 26 22 - 32 mmol/L   Glucose, Bld 110 (H) 70 - 99 mg/dL    Comment: Glucose reference range applies only to samples taken after fasting for at least 8 hours.   BUN 21 8 - 23 mg/dL   Creatinine, Ser 1.19 0.61 - 1.24 mg/dL   Calcium  8.7 (L) 8.9 - 10.3 mg/dL   Total Protein 5.5 (L) 6.5 - 8.1 g/dL   Albumin 2.7 (L) 3.5 - 5.0 g/dL   AST 28 15 - 41 U/L   ALT 25 0 - 44 U/L   Alkaline Phosphatase 48 38 - 126 U/L   Total Bilirubin 0.8 0.3 - 1.2 mg/dL   GFR, Estimated >60 >60 mL/min    Comment: (NOTE) Calculated using the CKD-EPI Creatinine Equation (2021)    Anion gap 8 5 - 15    Comment: Performed at [facility] [address] [place], [place] [address]  CBC     Status: Abnormal   Collection Time: 01/08/23 12:54 AM  Result Value Ref Range   WBC 15.3 (H) 4.0 - 10.5 K/uL   RBC 5.38 4.22 - 5.81 MIL/uL   Hemoglobin 14.1 13.0 - 17.0 g/dL   HCT 43.8 39.0 - 52.0 %   MCV 81.4 80.0 - 100.0 fL   MCH 26.2 26.0 - 34.0 pg   MCHC 32.2 30.0 - 36.0 g/dL   RDW 18.8 (H) 11.5 - 15.5 %   Platelets 243 150 - 400 K/uL   nRBC 0.0 0.0 - 0.2 %    Comment: Performed at [facility], [place] [address] [place], [place] [address]  Magnesium     Status: None   Collection Time: 01/08/23 12:54 AM  Result Value Ref Range   Magnesium 1.7 1.7 - 2.4 mg/dL    Comment: Performed at [facility] [address], [place], [place] [address]   No results found.    Blood pressure 134/78, pulse  76, temperature 98 F (36.7 C), temperature source Oral, resp. rate (!) 25, height 5\' 8"  (1.727 m), weight (!) 167.7 kg, SpO2 95 %.  Medical Problem List and Plan: 1. Functional deficits secondary to debility from CHF exacerbation  -patient may not shower until wound VAC removed  -ELOS/Goals: 12-16 days -supervision to min A 2.  Antithrombotics: -DVT/anticoagulation:  Pharmaceutical: Heparin  -antiplatelet therapy: none  3. Pain Management: Tylenol, oxycodone 60 mg TID as needed- is home dose  -continue pregablin 100 mg daily- just restarted -see # 13  4. Mood/Behavior/Sleep: LCSW to evaluate and provide emotional support  -continue Cymbalta 60 mg BID  -antipsychotic agents: n/a  5. Neuropsych/cognition: This patient is capable of making decisions on his own behalf.  6. Skin/Wound Care: Routine skin care checks  -maintain wound VAC to midline incision; change M/W/F  -consult [place] RN  7. Fluids/Electrolytes/Nutrition: Strict Is and Os and follow-up chemistries  -continue Prosource, Boost, iron supplements  8: HFmidrangeEF:  -daily weight  -weight up>>resuming Lasix 80 mg daily on 1/16  -losartan 12.5 mg daily resumed 1/16  9: OSA on CPAP- uses home CPAP    10: Atrial fibrillation: persistent but alternating SR; s/p DCCV  -Eliquis held; general surgery to approve restarting- they said would determine 1/17?  -continue Pacerone 400 mg daily, metoprolol 100 mg daily  11: Class 3 obesity: BMI 56.21 1/16  12: Hypertension: monitor TID and prn  -continue Lasix 80 mg daily  -continue losartan 12.5 mg daily  13: Peripheral neuropathy/lumbar spinal stenosis/chronic pain  -neurologist Dr. Metta Clines -oxycodone 60 mg TID, lorazepam 0.5 mg, pregabalin 100 mg at home per Dr. Gar Ponto  14: GERD/dyspepsia: Protonix 40 mg q HS  -give Tums before meals  15: BPH: increase Flomax to home dose>> 0.8 mg daily  16: COPD (previous tobacco use): continue Yupelri nebs daily- wheezed  at  rest with exam exertion- pt refuses Albuterol per chart.   17: Leukocytosis: afebrile; follow-up CBC  18: Loose stool: start probiotics  19. B/L Foot drop due to severe peripheral neuropathy- has AFO's but needs PRAFOs at bedtime- will order.   20. MASD- at groin and skin folds/between legs- con't Nystatin  I have personally performed a face to face diagnostic evaluation of this patient and formulated the key components of the plan.  Additionally, I have personally reviewed laboratory data, imaging studies, as well as relevant notes and concur with the physician assistant's documentation above.   The patient's status has not changed from the original H&P.  Any changes in documentation from the acute care chart have been noted above.     Milinda Antis, PA-C 01/08/2023

## 2023-01-08 NOTE — Progress Notes (Addendum)
Inpatient Rehabilitation Admission Medication Review by a Pharmacist  A complete drug regimen review was completed for this patient to identify any potential clinically significant medication issues.  High Risk Drug Classes Is patient taking? Indication by Medication  Antipsychotic Yes PRN prochlorperazine - n/v  Anticoagulant Yes Heparin SQ - VTE prophylaxis  Antibiotic No   Opioid Yes Oxycodone - pain  Antiplatelet No   Hypoglycemics/insulin No   Vasoactive Medication Yes Losartan, Furosemide, Metoprolol XL - heart failure and blood pressure  Chemotherapy No   Other Yes Amiodarone - atrial fibrillation Duloxetine - mood stabilizer Pregabalin - neuropathic pain Ferrous sulfate - supplement Pantoprazole - GERD Tamsulosin - BPH Revefenacin nebs - COPD  PRNs:  Acetaminophen - mild pain Maalox - indigestion Diphenhydramine - itching Methocarbamol - muscle relaxer Trazodone - sleep MOM, sorbitol, Fleets enema - laxatives Zinc Oxide ointment - skin protectant     Type of Medication Issue Identified Description of Issue Recommendation(s)  Drug Interaction(s) (clinically significant)     Duplicate Therapy     Allergy     No Medication Administration End Date     Incorrect Dose     Additional Drug Therapy Needed     Significant med changes from prior encounter (inform family/care partners about these prior to discharge). Holding PTA Apixaban for atrial fibrillation until cleared to resume by Surgery Tamsulosin dose increased. On SQ heparin since 01/04/23, to stop when Apixaban is resumed.  Other  To resume per discharge summary: Folate, MVI PRN Tramadol for moderate pain PRN Tums for indigestion PRN Lorazepam for anxiety PRN cyclobenzaprine for muscle spams PRN melatonin for sleep PRN levalbuterol for wheezing or shortness of breath PRN promethazine PO for  n/v PRN Imitrex for migraines PRN simethicone for flatulence  Resume supplements as inpt or at DC. PRN  Oxycodone for pain PRN Maalox for indigestion Resume if indicated PRN methocarbamol for muscle spasms PRN Trazodone for sleep Resume if indicated  PRN prochlorperazine for n/v Resume if indicated Resume if indicated    Clinically significant medication issues were identified that warrant physician communication and completion of prescribed/recommended actions by midnight of the next day:  No  Pharmacist comments:   - Has been on Pantoprazole once daily; listed as BID prior to admission, but PA reports that patient and wife report that he takes qhs. PTA med list updated.  Time spent performing this drug regimen review (minutes):  [address]   Arty Baumgartner, [place]y 01/08/2023 4:19 PM

## 2023-01-08 NOTE — Discharge Summary (Signed)
Physician Discharge Summary   Patient: Timothy Macias MRN: [id number] DOB: 06-Apr-1961  Admit date:     12/28/2022  Discharge date: 01/08/23  Discharge Physician: Rickey Barbara   PCP: Richardean Chimera, MD   Recommendations at discharge:    Follow up with PCP when discharged  Discharge Diagnoses: Principal Problem:   Atrial fibrillation with RVR (HCC) Active Problems:   Essential hypertension   Gastroesophageal reflux disease without esophagitis   Obesity, morbid, BMI 50 or higher (HCC)   OSA on CPAP   AKI (acute kidney injury) (HCC)   Small bowel obstruction (HCC)   Acute on chronic congestive heart failure (HCC)  Resolved Problems:   * No resolved hospital problems. *  Hospital Course: Timothy Macias  is a 62 y.o. male, hx of HFmrEF (45%), OSA on CPAP, persistent A-fib, on Toprol-XL and Eliquis, CHF, hx of osteomyelitis and Bell's Palsy, 3 of recurrent A-fib in the past for which he required cardioversion, most recent during hospital stay at [facility] April 2022),. -Patient's presents to ED secondary to palpitation, rapid heart rate and worsening shortness of breath, he does report fluid retention/weight gain 43 pounds over last 1 to 2 weeks, reports he has been compliant with his Lasix, report intermittent palpitation, he knew it was A-fib, has been going on since Christmas, initially intermittent, but currently more consistent, he does report worsening dyspnea, worsening lower extremity edema, he does report he is compliant with Eliquis and amiodarone, as well he is compliant with his Lasix 80 mg oral twice daily, but report it has not been helping with his fluid retention   ED: He was 87% on room air, started on 3 L oxygen nasal cannula, and A-fib with RVR heart rate in the 150s, troponins were within normal limit x 2, blood pressure has been soft, magnesium was 1.8, potassium 3.5, creatinine was elevated at 2.4, well his hemoglobin was elevated at 17.3, EDP discussed with  cardiology at [facility] Dr. Diona Browner, who recommended initiation of amiodarone drip (patient reported Cardizem drip has not worked for him in the past), Requested to admit to [facility] as no cardiology coverage at [facility] over the weekend.  1/6 admitted 1/7 amiodarone infusion, IVF, NGT placed to decompress abdomen. Transferring to ICU. 1/9 DCCV failed due to ongoing bowel obstruction. 1/10 underwent laparotomy with lysis of adhesions 1/11 extubated 1/12 blood from incision with heparin restart.  Assessment and Plan: SBO -General Surgery following. Pt is now s/p exlap with LOA and SBR 1/10 -recs for VAC and mobilize with PT -CIR following with plans for CIR admit -Advancing diet per Gen Surg -Some wound bleeding noted recently with eliquis temporarily on hold per General Surgery, continued on prophylactic heparin in the meantime   Acute on chronic hypercapnic hypoxic respiratory failure -Pulm had been following -cont bipap qhs and PRN -wean O2 as tolerated   Obesity -Recommend diet/lifestyle modification   Afib -rate controlled at this time -on eliquis post-op but later stopped secondary to concerns of bleeding at op site -Now on prophylactic heparin -Would resume anticoagulation when OK with surgery   ARF -renal function improved with Cr 1.19 -cont to avoid nephrotoxic agents -continue to follow renal function trends   NASH -Follow LFT trends -Thus far stable   HTN -BP stable and controlled -cont current regimen per below   Hypokalemia -replaced -Would monitor lytes intermittently and replace as needed       Consultants: General Surgery, PCCM, CIR Procedures performed: exlap with  LOA and SBR 1/10   Disposition: Rehabilitation facility Diet recommendation:  Soft DISCHARGE MEDICATION: Allergies as of 01/08/2023       Reactions   Albuterol Swelling   Amiodarone Rash   Penicillins Hives        Medication List     STOP taking  these medications    apixaban 5 MG Tabs tablet Commonly known as: ELIQUIS   diphenhydramine-acetaminophen 25-500 MG Tabs tablet Commonly known as: TYLENOL PM   Magnesium 400 MG Tabs   potassium chloride SA 20 MEQ tablet Commonly known as: KLOR-CON M   testosterone cypionate 200 MG/ML injection Commonly known as: DEPOTESTOSTERONE CYPIONATE       TAKE these medications    (feeding supplement) PROSource Plus liquid Take 30 mLs by mouth 3 (three) times daily between meals.   acetaminophen 500 MG tablet Commonly known as: TYLENOL Take 1,000 mg by mouth every 8 (eight) hours as needed for mild pain.   amiodarone 200 MG tablet Commonly known as: PACERONE Take 400 mg by mouth daily.   calcium carbonate 500 MG chewable tablet Commonly known as: TUMS - dosed in mg elemental calcium Chew 1 tablet (200 mg of elemental calcium total) by mouth 3 (three) times daily as needed for indigestion or heartburn.   cyclobenzaprine 10 MG tablet Commonly known as: FLEXERIL Take 10 mg by mouth 3 (three) times daily as needed for muscle spasms.   DULoxetine 60 MG capsule Commonly known as: CYMBALTA Take 60 mg by mouth 2 (two) times daily.   ferrous sulfate 325 (65 FE) MG EC tablet Take 325 mg by mouth daily with breakfast.   folic acid 1 MG tablet Commonly known as: FOLVITE Take 1 mg by mouth daily.   furosemide 80 MG tablet Commonly known as: LASIX Take 80 mg by mouth daily.   heparin 5000 UNIT/ML injection Inject 1 mL (5,000 Units total) into the skin every 8 (eight) hours.   levalbuterol 0.63 MG/3ML nebulizer solution Commonly known as: XOPENEX Take 3 mLs (0.63 mg total) by nebulization every 6 (six) hours as needed for wheezing or shortness of breath.   liver oil-zinc oxide 40 % ointment Commonly known as: DESITIN Apply topically every 4 (four) hours as needed for irritation (after each bowel movement.).   LORazepam 0.5 MG tablet Commonly known as: ATIVAN Take 0.5 mg by  mouth every 8 (eight) hours as needed for anxiety.   losartan 25 MG tablet Commonly known as: COZAAR Take 12.5 mg by mouth daily.   melatonin 5 MG Tabs Take 5-10 mg by mouth at bedtime as needed (sleep).   metoprolol succinate 100 MG 24 hr tablet Commonly known as: TOPROL-XL Take 1 tablet (100 mg total) by mouth daily. Take with or immediately following a meal. Start taking on: January 09, 2023 What changed:  medication strength how much to take additional instructions   oxycodone 30 MG immediate release tablet Commonly known as: ROXICODONE Take 60 mg by mouth 3 (three) times daily as needed (pain).   pantoprazole 40 MG tablet Commonly known as: PROTONIX Take 1 tablet by mouth 2 (two) times daily.   pregabalin 100 MG capsule Commonly known as: LYRICA Take 100 mg by mouth at bedtime.   promethazine 25 MG tablet Commonly known as: PHENERGAN Take 25 mg by mouth every 6 (six) hours as needed for nausea or vomiting.   revefenacin 175 MCG/3ML nebulizer solution Commonly known as: YUPELRI Take 3 mLs (175 mcg total) by nebulization daily. Start taking on: January 09, 2023   simethicone 80 MG chewable tablet Commonly known as: MYLICON Chew 1 tablet (80 mg total) by mouth every 6 (six) hours as needed for flatulence.   SUMAtriptan 100 MG tablet Commonly known as: IMITREX Take 100 mg by mouth every 2 (two) hours as needed for migraine.   tamsulosin 0.4 MG Caps capsule Commonly known as: FLOMAX Take 1 capsule (0.4 mg total) by mouth daily. Start taking on: January 09, 2023 What changed: how much to take   traMADol 50 MG tablet Commonly known as: ULTRAM Take 1 tablet (50 mg total) by mouth every 6 (six) hours as needed for moderate pain.   Your Life Multi Adult Gummies Chew Chew by mouth.               Durable Medical Equipment  (From admission, onward)           Start     Ordered   01/07/23 1029  For home use only DME Negative pressure wound device   Once       Question Answer Comment  Frequency of dressing change 3 times per week   Length of need 6 Months   Dressing type Foam   Amount of suction 125 mm/Hg   Pressure application Continuous pressure   Supplies 10 canisters and 15 dressings per month for duration of therapy      01/07/23 1028            Discharge Exam: Filed Weights   01/06/23 0419 01/07/23 0001 01/08/23 0542  Weight: (!) 164.8 kg (!) 163.2 kg (!) 167.7 kg   General exam: Awake, laying in bed, in nad Respiratory system: Normal respiratory effort, no wheezing Cardiovascular system: regular rate, s1, s2 Gastrointestinal system: Soft, nondistended, positive BS Central nervous system: CN2-12 grossly intact, strength intact Extremities: Perfused, no clubbing Skin: Normal skin turgor, no notable skin lesions seen Psychiatry: Mood normal // no visual hallucinations   Condition at discharge: fair  The results of significant diagnostics from this hospitalization (including imaging, microbiology, ancillary and laboratory) are listed below for reference.   Imaging Studies: [place] EKG SITE RITE  Result Date: 01/03/2023 If Site Rite image not attached, placement could not be confirmed due to current cardiac rhythm.  DG Abd Portable 1V  Result Date: 01/02/2023 CLINICAL DATA:  Small-bowel obstruction EXAM: PORTABLE ABDOMEN - 1 VIEW COMPARISON:  12/31/2022 FINDINGS: NG enters the stomach with the tip not visualized. Majority of the abdomen not included on the study. Bibasilar airspace disease left greater than right. IMPRESSION: NG enters the stomach with the tip not visualized. Majority of the abdomen not included on the study. Electronically Signed   By: Franchot Gallo M.D.   On: 01/02/2023 07:48   DG Abd Portable 1V  Result Date: 01/02/2023 CLINICAL DATA:  Small-bowel obstruction EXAM: PORTABLE ABDOMEN - 1 VIEW COMPARISON:  01/02/2023, 12/31/2022 FINDINGS: NG tube in the proximal stomach. Mildly distended loops of  colon also present. Moderate amount of stool in the right colon. Right hip replacement IMPRESSION: NG tube in the proximal stomach. Mildly distended colon. Electronically Signed   By: Franchot Gallo M.D.   On: 01/02/2023 07:47   DG Abd Portable 1V-Small Bowel Obstruction Protocol-initial, 8 hr delay  Result Date: 12/31/2022 CLINICAL DATA:  Small-bowel obstruction EXAM: PORTABLE ABDOMEN - 1 VIEW COMPARISON:  CT abdomen and pelvis 12/30/2022 FINDINGS: There are numerous air-filled dilated small bowel loops in the central abdomen. These measure up to 7.5 cm, similar to the prior study. Minimal  colonic gas is present. Nasogastric tube tip is at the level of the mid stomach, unchanged. Right hip arthroplasty is present. Visualized lung bases are clear. IMPRESSION: Persistent dilated small bowel loops in the central abdomen compatible with small-bowel obstruction. Electronically Signed   By: Ronney Asters M.D.   On: 12/31/2022 20:10   DG Chest Port 1 View  Result Date: 12/30/2022 CLINICAL DATA:  Shortness of breath EXAM: PORTABLE CHEST 1 VIEW COMPARISON:  Chest x-ray 12/28/2022 FINDINGS: Enteric tube extends below the diaphragm. The heart is enlarged. There is central pulmonary vascular congestion. There are patchy opacities in the left lung base questionable small left pleural effusion. There is no pneumothorax or acute fracture. IMPRESSION: 1. Cardiomegaly with central pulmonary vascular congestion. 2. Patchy opacities in the left lung base could be atelectasis or infiltrate. Electronically Signed   By: Ronney Asters M.D.   On: 12/30/2022 23:31   CT ABDOMEN PELVIS WO CONTRAST  Result Date: 12/30/2022 CLINICAL DATA:  Three days of nausea, abdominal pain, and distention. Concern for bowel obstruction. EXAM: CT ABDOMEN AND PELVIS WITHOUT CONTRAST TECHNIQUE: Multidetector CT imaging of the abdomen and pelvis was performed following the standard protocol without IV contrast. RADIATION DOSE REDUCTION: This exam was  performed according to the departmental dose-optimization program which includes automated exposure control, adjustment of the mA and/or kV according to patient size and/or use of iterative reconstruction technique. COMPARISON:  CT abdomen pelvis dated 06/01/2021. FINDINGS: Lower chest: There is moderate bilateral lower lobe atelectasis. Heart is enlarged. Hepatobiliary: No focal liver abnormality is seen. No gallstones, gallbladder wall thickening, or biliary dilatation. Pancreas: Unremarkable. No pancreatic ductal dilatation or surrounding inflammatory changes. Spleen: Normal in size without focal abnormality. Adrenals/Urinary Tract: Adrenal glands are unremarkable. Bilateral renal cysts are redemonstrated with the largest measuring 4.9 cm on the right and 9.0 x 6.2 cm on the left. Bilateral nonobstructing calculi are seen, measuring up to 8 x 5 mm on the right and 8 x 5 mm on the left. There is no hydronephrosis on either side. The urinary bladder is unremarkable. Stomach/Bowel: An enteric tube terminates in the stomach. Stomach is within normal limits. Appendix appears normal. Multiple dilated loops of small bowel are seen with a transition 0.2 distal decompressed small bowel in the lower mid abdomen. There is colonic diverticulosis without evidence of diverticulitis. Vascular/Lymphatic: Aortic atherosclerosis. No enlarged abdominal or pelvic lymph nodes. Reproductive: The prostate is obscured by streak artifact. Other: The patient is status post anterior abdominal wall hernia repair. No abdominal wall hernia is identified on today's exam. No free intraperitoneal fluid. Musculoskeletal: The patient is status post a total hip arthroplasty on the right which obscures portions of the pelvis. Degenerative changes are seen in the spine. IMPRESSION: 1. High-grade small-bowel obstruction with a transition point in the lower mid abdomen. No abdominal wall hernia. 2. Moderate bilateral lower lobe atelectasis. 3.  Cardiomegaly. Aortic Atherosclerosis (ICD10-I70.0). Electronically Signed   By: Zerita Boers M.D.   On: 12/30/2022 22:03   CT HEAD WO CONTRAST (5MM)  Result Date: 12/30/2022 CLINICAL DATA:  Altered mental status. EXAM: CT HEAD WITHOUT CONTRAST TECHNIQUE: Contiguous axial images were obtained from the base of the skull through the vertex without intravenous contrast. RADIATION DOSE REDUCTION: This exam was performed according to the departmental dose-optimization program which includes automated exposure control, adjustment of the mA and/or kV according to patient size and/or use of iterative reconstruction technique. COMPARISON:  CT head dated 06/04/2019 FINDINGS: Brain: No evidence of acute infarction, hemorrhage,  hydrocephalus, extra-axial collection or mass lesion/mass effect. Periventricular white matter hypoattenuation likely represents chronic small vessel ischemic disease. Vascular: There are vascular calcifications in the carotid siphons. Skull: Normal. Negative for fracture or focal lesion. Sinuses/Orbits: No acute finding. Other: A nasogastric tube is partially imaged. IMPRESSION: 1. No acute intracranial process. Electronically Signed   By: Zerita Boers M.D.   On: 12/30/2022 21:53   DG Abd 1 View  Result Date: 12/30/2022 CLINICAL DATA:  Enteric tube placement. EXAM: ABDOMEN - 1 VIEW COMPARISON:  Abdominal radiograph dated 12/30/2022. FINDINGS: An enteric tube terminates in the stomach. Multiple gas-filled loops of bowel are partially imaged. IMPRESSION: An enteric tube terminates in the stomach. Electronically Signed   By: Zerita Boers M.D.   On: 12/30/2022 17:17   DG Abd Portable 1V  Result Date: 12/30/2022 CLINICAL DATA:  NG tube placement EXAM: PORTABLE ABDOMEN - 1 VIEW COMPARISON:  12/30/2022, 10:31 a.m. FINDINGS: Esophagogastric tube is positioned with tip in the stomach, side port at the level of the gastroesophageal junction. Stomach and included small bowel remain gas distended. No  obvious free air. IMPRESSION: 1. Esophagogastric tube is positioned with tip in the stomach, side port at the level of the gastroesophageal junction. Consider advancement to ensure subdiaphragmatic positioning. 2. Stomach and included small bowel remain gas distended consistent with ileus or obstruction. Electronically Signed   By: Delanna Ahmadi M.D.   On: 12/30/2022 15:43   US RENAL  Result Date: 12/30/2022 CLINICAL DATA:  Acute renal insufficiency EXAM: RENAL / URINARY TRACT ULTRASOUND COMPLETE COMPARISON:  Renal ultrasound August 21, 2022 FINDINGS: Right Kidney: Renal measurements: 11.5 x 6.0 x 7.1 cm = volume: 255 mL. Echogenicity within normal limits. No mass or hydronephrosis visualized. Left Kidney: A 7.8 x 6.5 x 6.8 cm cyst is seen in the expected location of the left kidney. The left kidney which was identified on the August 21, 2022 study is not visualized on provided images. Bladder: The bladder is empty limiting evaluation. Other: None. IMPRESSION: 1. A 7.8 x 6.5 x 6.8 cm cyst is seen in the expected location of the left kidney. The left kidney which was identified on the August 21, 2022 study is not visualized on provided images. 2. The right kidney is normal. 3. The bladder is empty limiting evaluation. Electronically Signed   By: Dorise Bullion III M.D.   On: 12/30/2022 14:31   ECHOCARDIOGRAM COMPLETE  Result Date: 12/30/2022    ECHOCARDIOGRAM REPORT   Patient Name:   GOLDIE SANSOM Date of Exam: 12/30/2022 Medical Rec #:  [id number]         Height:       67.5 in Accession #:    [id number]        Weight:       389.8 lb Date of Birth:  01-Aug-1961          BSA:          2.700 m Patient Age:    41 years          BP:           124/95 mmHg Patient Gender: M                 HR:           175 bpm. Exam Location:  Inpatient Procedure: 2D Echo Indications:    CHF  History:        Patient has no prior history of Echocardiogram examinations.  Sonographer:    Abigail Butts  Porter Referring Phys: [id number] SEYED A  SHAHMEHDI  Sonographer Comments: Technically difficult study due to poor echo windows and patient is obese. Image acquisition challenging due to patient body habitus and Image acquisition challenging due to respiratory motion. IMPRESSIONS  1. Poor acoustic windows. Non-diagnsotic study. Consider repeat Echo with contrast at normal heart rate. Comparison(s): No prior Echocardiogram. FINDINGS  Left Ventricle: Poor acoustic windows. Non-diagnsotic study. Consider repeat Echo with contrast at normal heart rate.  LEFT VENTRICLE PLAX 2D LVIDd:         4.90 cm   Diastology LVIDs:         3.60 cm   LV e' medial:    6.02 cm/s LV PW:         1.20 cm   LV E/e' medial:  15.2 LV IVS:        1.20 cm   LV e' lateral:   10.13 cm/s LVOT diam:     2.50 cm   LV E/e' lateral: 9.0 LVOT Area:     4.91 cm  LEFT ATRIUM           Index LA diam:      4.00 cm 1.48 cm/m LA Vol (A4C): 75.3 ml 27.89 ml/m                        PULMONIC VALVE AORTA                 PV Vmax:       1.25 m/s Ao Root diam: 3.90 cm PV Peak grad:  6.2 mmHg Ao Asc diam:  3.60 cm  MITRAL VALVE MV Area (PHT): 5.16 cm    SHUNTS MV Decel Time: 147 msec    Systemic Diam: 2.50 cm MR Peak grad: 10.0 mmHg MR Vmax:      158.00 cm/s MV E velocity: 91.57 cm/s MV A velocity: 34.10 cm/s MV E/A ratio:  2.69 Vishnu Priya Mallipeddi Electronically signed by Lorelee Cover Mallipeddi Signature Date/Time: 12/30/2022/11:48:24 AM    Final    DG Abd 1 View  Result Date: 12/30/2022 CLINICAL DATA:  Abdominal pain. EXAM: ABDOMEN - 1 VIEW COMPARISON:  Scout image for CT abdomen/pelvis 06/01/2021 FINDINGS: Extremely marked gaseous dilatation of the stomach noted. This is associated with diffuse small bowel dilatation with dilated gas-filled small bowel loops measuring up to at least 6.3 cm diameter. Air in stool are seen in the colon. Bones are demineralized. Status post right hip replacement. IMPRESSION: 1. Extremely marked gaseous dilatation of the stomach. 2. Diffuse marked gaseous small  bowel dilatation with dilatedloops measuring up to 6.3 cm diameter. Imaging features highly concerning for small bowel obstruction. Abdomen/pelvis CT with contrast would likely prove helpful to further evaluate. Electronically Signed   By: Misty Stanley M.D.   On: 12/30/2022 11:05   DG Chest Port 1 View  Result Date: 12/28/2022 CLINICAL DATA:  Substantial weight loss over several days. Atrial fibrillation. Upper abdominal pain. EXAM: PORTABLE CHEST 1 VIEW COMPARISON:  04/10/2022 FINDINGS: Mild enlargement of the cardiopericardial silhouette, without findings of acute pulmonary edema. Poor definition left hemidiaphragm which is probably related to apical lordotic projection, cannot exclude left lower lobe airspace opacity. Correlate with chest auscultation. The lungs appear otherwise clear.  Mild lower thoracic spondylosis. IMPRESSION: 1. Mild enlargement of the cardiopericardial silhouette, without edema. 2. Poor definition of the left hemidiaphragm which is probably related to apical lordotic projection, cannot exclude left lower lobe airspace opacity. Correlate with chest auscultation. Electronically  Signed   By: Van Clines M.D.   On: 12/28/2022 13:56    Microbiology: Results for orders placed or performed during the hospital encounter of 12/28/22  MRSA Next Gen by PCR, Nasal     Status: None   Collection Time: 12/30/22  7:34 PM   Specimen: Nasal Mucosa; Nasal Swab  Result Value Ref Range Status   MRSA by PCR Next Gen NOT DETECTED NOT DETECTED Final    Comment: (NOTE) The GeneXpert MRSA Assay (FDA approved for NASAL specimens only), is one component of a comprehensive MRSA colonization surveillance program. It is not intended to diagnose MRSA infection nor to guide or monitor treatment for MRSA infections. Test performance is not FDA approved in patients less than 79 years old. Performed at [facility], [place] [address], [place], [place] [address]     Labs: CBC: Recent Labs   Lab 01/04/23 0423 01/05/23 0433 01/06/23 0414 01/07/23 0028 01/08/23 0054  WBC 17.4* 15.1* 16.2* 14.8* 15.3*  HGB 17.1* 15.4 16.2 14.2 14.1  HCT 52.1* 48.3 51.7 45.3 43.8  MCV 81.0 82.3 83.4 82.1 81.4  PLT 277 195 153 229 0000000   Basic Metabolic Panel: Recent Labs  Lab 01/02/23 0328 01/02/23 1245 01/03/23 0554 01/04/23 0656 01/05/23 0433 01/05/23 0630 01/07/23 0028 01/08/23 0054  NA 136   < > 136 135  --  138 134* 133*  K 2.8*   < > 3.8 3.2*  --  3.3* 3.0* 4.2  CL 83*   < > 87* 89*  --  91* 95* 99  CO2 35*   < > 33* 34*  --  29 29 26   GLUCOSE 80   < > 104* 84  --  83 97 110*  BUN 49*   < > 39* 30*  --  37* 23 21  CREATININE 1.75*   < > 1.55* 1.48*  --  1.43* 1.19 1.19  CALCIUM 8.9   < > 8.8* 8.6*  --  8.7* 8.5* 8.7*  MG 2.2  --  2.2 2.0 2.0  --   --  1.7  PHOS  --   --   --  2.2*  --   --   --   --    < > = values in this interval not displayed.   Liver Function Tests: Recent Labs  Lab 01/04/23 0656 01/08/23 0054  AST 23 28  ALT 10 25  ALKPHOS 47 48  BILITOT 1.0 0.8  PROT 5.6* 5.5*  ALBUMIN 2.8* 2.7*   CBG: Recent Labs  Lab 01/06/23 1638 01/06/23 1946 01/07/23 0000 01/07/23 0413 01/07/23 0830  GLUCAP 117* 133* 97 114* 81    Discharge time spent: less than 30 minutes.  Signed: Marylu Lund, MD [facility] 01/08/2023

## 2023-01-08 NOTE — Progress Notes (Signed)
Progress Note  6 Days Post-Op  Subjective: Pt reports he tolerated soft diet and is passing flatus. One episode of mild nausea this AM but none currently. Pain well controlled with chronic pain meds reordered. VAC replaced yesterday. Unable to sleep overnight, takes tylenol PM at home and phenergan as needed. His wife may bring his home CPAP today as well.   Objective: Vital signs in last 24 hours: Temp:  [98 F (36.7 C)-98.5 F (36.9 C)] 98 F (36.7 C) (01/16 0807) Pulse Rate:  [55-76] 76 (01/16 0903) Resp:  [15-25] 25 (01/16 0807) BP: (101-134)/(7-78) 134/78 (01/16 0807) SpO2:  [93 %-98 %] 95 % (01/16 0815) Weight:  [167.7 kg] 167.7 kg (01/16 0542) Last BM Date : 01/07/23  Intake/Output from previous day: 01/15 0701 - 01/16 0700 In: 840 [P.O.:840] Out: 650 [Urine:650] Intake/Output this shift: Total I/O In: 360 [P.O.:360] Out: -   PE: General: pleasant, WD, obese male who is laying in bed in NAD Heart: regular, rate, and rhythm.  Lungs: Respiratory effort nonlabored Abd: soft, appropriately ttp, wound VAC to midline with SS fluid in cannister  Psych: A&Ox3 with an appropriate affect.    Lab Results:  Recent Labs    01/07/23 0028 01/08/23 0054  WBC 14.8* 15.3*  HGB 14.2 14.1  HCT 45.3 43.8  PLT 229 243    BMET Recent Labs    01/07/23 0028 01/08/23 0054  NA 134* 133*  K 3.0* 4.2  CL 95* 99  CO2 29 26  GLUCOSE 97 110*  BUN 23 21  CREATININE 1.19 1.19  CALCIUM 8.5* 8.7*    PT/INR No results for input(s): "LABPROT", "INR" in the last 72 hours. CMP     Component Value Date/Time   NA 133 (L) 01/08/2023 0054   K 4.2 01/08/2023 0054   CL 99 01/08/2023 0054   CO2 26 01/08/2023 0054   GLUCOSE 110 (H) 01/08/2023 0054   BUN 21 01/08/2023 0054   CREATININE 1.19 01/08/2023 0054   CALCIUM 8.7 (L) 01/08/2023 0054   PROT 5.5 (L) 01/08/2023 0054   ALBUMIN 2.7 (L) 01/08/2023 0054   AST 28 01/08/2023 0054   ALT 25 01/08/2023 0054   ALKPHOS 48 01/08/2023  0054   BILITOT 0.8 01/08/2023 0054   GFRNONAA >60 01/08/2023 0054   Lipase  No results found for: "LIPASE"     Studies/Results: No results found.  Anti-infectives: Anti-infectives (From admission, onward)    Start     Dose/Rate Route Frequency Ordered Stop   01/03/23 0000  ciprofloxacin (CIPRO) IVPB 400 mg        400 mg 200 mL/hr over 60 Minutes Intravenous Every 12 hours 01/02/23 1649 01/03/23 1206   01/02/23 2200  metroNIDAZOLE (FLAGYL) IVPB 500 mg        500 mg 100 mL/hr over 60 Minutes Intravenous Every 8 hours 01/02/23 1649 01/03/23 1519   01/02/23 0915  ciprofloxacin (CIPRO) IVPB 400 mg        400 mg 200 mL/hr over 60 Minutes Intravenous On call to O.R. 01/02/23 0817 01/02/23 1431   01/02/23 0915  metroNIDAZOLE (FLAGYL) IVPB 500 mg        500 mg 100 mL/hr over 60 Minutes Intravenous On call to O.R. 01/02/23 0817 01/02/23 1508        Assessment/Plan  POD6, s/p ex lap with LOA and SBR for SBO, Dr. Donne Hazel 1/10 - tolerating soft diet  - continue VAC and change M/W/F - mobilize with nursing and PT - IS,  pulm toilet - continue chronic home pain meds  - WBC 15 from 14, afebrile and clinically looks well - recheck tomorrow AM   FEN - soft diet  VTE - SQH ID - completed 24 hrs post op abx   - below per TRH -  Afib T. CHF HTN OSA NASH Acute on CKD  Morbid obesity   LOS: 11 days    Norm Parcel, [facility] Surgery 01/08/2023, 9:12 AM Please see Amion for pager number during day hours 7:00am-4:30pm

## 2023-01-08 NOTE — Progress Notes (Signed)
Inpatient Rehabilitation Center Individual Statement of Services  Patient Name:  Timothy Macias  Date:  01/08/2023  Welcome to the [facility].  Our goal is to provide you with an individualized program based on your diagnosis and situation, designed to meet your specific needs.  With this comprehensive rehabilitation program, you will be expected to participate in at least 3 hours of rehabilitation therapies Monday-Friday, with modified therapy programming on the weekends.  Your rehabilitation program will include the following services:  Physical Therapy (PT), Occupational Therapy (OT), Speech Therapy (ST), 24 hour per day rehabilitation nursing, Therapeutic Recreaction (TR), Neuropsychology, Care Coordinator, Rehabilitation Medicine, Nutrition Services, Pharmacy Services, and Other  Weekly team conferences will be held on Wednesdays to discuss your progress.  Your Inpatient Rehabilitation Care Coordinator will talk with you frequently to get your input and to update you on team discussions.  Team conferences with you and your family in attendance may also be held.  Expected length of stay:  12-14 Days  Overall anticipated outcome:  Supervision- Min A  Depending on your progress and recovery, your program may change. Your Inpatient Rehabilitation Care Coordinator will coordinate services and will keep you informed of any changes. Your Inpatient Rehabilitation Care Coordinator's name and contact numbers are listed  below.  The following services may also be recommended but are not provided by the [place]:   [place] will be made to provide these services after discharge if needed.  Arrangements include referral to agencies that provide these services.  Your insurance has been verified to be:  [employer] Your primary doctor is:  Gar Ponto, MD  Pertinent information  will be shared with your doctor and your insurance company.  Inpatient Rehabilitation Care Coordinator:  Erlene Quan, [place] or [phone]  Information discussed with and copy given to patient by: [name], 01/08/2023, 3:40 PM

## 2023-01-08 NOTE — Progress Notes (Addendum)
Rounding Note    Patient Name: Timothy Macias Date of Encounter: 01/08/2023  [place] Cardiologist: None   Subjective   No chest pain  Inpatient Medications    Scheduled Meds:  (feeding supplement) PROSource Plus  30 mL Oral TID BM   acetaminophen  1,000 mg Oral Q8H   amiodarone  400 mg Oral Daily   Chlorhexidine Gluconate Cloth  6 each Topical Daily   DULoxetine  60 mg Oral BID   feeding supplement  1 Container Oral TID BM   ferrous sulfate  325 mg Oral Q breakfast   heparin injection (subcutaneous)  5,000 Units Subcutaneous Q8H   metoprolol succinate  100 mg Oral Daily   pantoprazole (PROTONIX) IV  40 mg Intravenous Q24H   pregabalin  100 mg Oral Daily   revefenacin  175 mcg Nebulization Daily   tamsulosin  0.4 mg Oral Daily   Continuous Infusions:  PRN Meds: artificial tears, cyclobenzaprine, HYDROmorphone (DILAUDID) injection, levalbuterol, liver oil-zinc oxide, menthol-cetylpyridinium, metoprolol tartrate, ondansetron (ZOFRAN) IV, mouth rinse, oxycodone, phenol, simethicone, traMADol   Vital Signs    Vitals:   01/08/23 0802 01/08/23 0807 01/08/23 0815 01/08/23 0903  BP:  134/78    Pulse:  (!) 55  76  Resp:  (!) 25    Temp: 98 F (36.7 C) 98 F (36.7 C)    TempSrc: Oral Oral    SpO2:  93% 95%   Weight:      Height:        Intake/Output Summary (Last 24 hours) at 01/08/2023 0917 Last data filed at 01/08/2023 0809 Gross per 24 hour  Intake 840 ml  Output 650 ml  Net 190 ml      01/08/2023    5:42 AM 01/07/2023   12:01 AM 01/06/2023    4:19 AM  Last 3 Weights  Weight (lbs) 369 lb 11.4 oz 359 lb 12.7 oz 363 lb 5.1 oz  Weight (kg) 167.7 kg 163.2 kg 164.8 kg      Telemetry    SR with PACs  - Personally Reviewed  ECG    No new - Personally Reviewed  Physical Exam  Per Dr. Oval Linsey GEN: No acute distress.   Neck: No JVD Cardiac: RRR, no murmurs, rubs, or gallops.  Respiratory: Clear to auscultation bilaterally. GI: Soft,  nontender, non-distended  MS: No edema; No deformity. Neuro:  Nonfocal  Psych: Normal affect   Labs    High Sensitivity Troponin:   Recent Labs  Lab 12/28/22 1400 12/28/22 1620 12/29/22 1556 12/29/22 1713  TROPONINIHS 9 8 7 7      Chemistry Recent Labs  Lab 01/04/23 0656 01/05/23 0433 01/05/23 0630 01/07/23 0028 01/08/23 0054  NA 135  --  138 134* 133*  K 3.2*  --  3.3* 3.0* 4.2  CL 89*  --  91* 95* 99  CO2 34*  --  29 29 26   GLUCOSE 84  --  83 97 110*  BUN 30*  --  37* 23 21  CREATININE 1.48*  --  1.43* 1.19 1.19  CALCIUM 8.6*  --  8.7* 8.5* 8.7*  MG 2.0 2.0  --   --  1.7  PROT 5.6*  --   --   --  5.5*  ALBUMIN 2.8*  --   --   --  2.7*  AST 23  --   --   --  28  ALT 10  --   --   --  25  ALKPHOS 47  --   --   --  76  BILITOT 1.0  --   --   --  0.8  GFRNONAA 53*  --  56* >60 >60  ANIONGAP 12  --  18* 10 8    Lipids  Recent Labs  Lab 01/03/23 0554  TRIG 127    Hematology Recent Labs  Lab 01/06/23 0414 01/07/23 0028 01/08/23 0054  WBC 16.2* 14.8* 15.3*  RBC 6.20* 5.52 5.38  HGB 16.2 14.2 14.1  HCT 51.7 45.3 43.8  MCV 83.4 82.1 81.4  MCH 26.1 25.7* 26.2  MCHC 31.3 31.3 32.2  RDW 19.9* 19.0* 18.8*  PLT 153 229 243   Thyroid No results for input(s): "TSH", "FREET4" in the last 168 hours.  BNPNo results for input(s): "BNP", "PROBNP" in the last 168 hours.  DDimer No results for input(s): "DDIMER" in the last 168 hours.   Radiology    No results found.  Cardiac Studies   Expand All Collapse All Echo 12/30/22 IMPRESSIONS     1. Poor acoustic windows. Non-diagnsotic study. Consider repeat Echo with  contrast at normal heart rate.   Comparison(s): No prior Echocardiogram.   FINDINGS   Left Ventricle: Poor acoustic windows. Non-diagnsotic study. Consider  repeat Echo with contrast at normal heart rate.     LEFT VENTRICLE  PLAX 2D  LVIDd:         4.90 cm   Diastology  LVIDs:         3.60 cm   LV e' medial:    6.02 cm/s  LV PW:         1.20  cm   LV E/e' medial:  15.2  LV IVS:        1.20 cm   LV e' lateral:   10.13 cm/s  LVOT diam:     2.50 cm   LV E/e' lateral: 9.0  LVOT Area:     4.91 cm     LEFT ATRIUM           Index  LA diam:      4.00 cm 1.48 cm/m  LA Vol (A4C): 75.3 ml 27.89 ml/m                         PULMONIC VALVE  AORTA                 PV Vmax:       1.25 m/s  Ao Root diam: 3.90 cm PV Peak grad:  6.2 mmHg  Ao Asc diam:  3.60 cm     MITRAL VALVE  MV Area (PHT): 5.16 cm    SHUNTS  MV Decel Time: 147 msec    Systemic Diam: 2.50 cm  MR Peak grad: 10.0 mmHg  MR Vmax:      158.00 cm/s  MV E velocity: 91.57 cm/s  MV A velocity: 34.10 cm/s  MV E/A ratio:  2.69     Patient Profile     62 y.o. male   hx of HFmrEF (45%), OSA on CPAP, persistent A-fib, on Toprol-XL and Eliquis, CHF, hx of osteomyelitis and Bell's Palsy, 3 of recurrent A-fib in the past for which he required cardioversion, most recent during hospital stay at [facility] April 2022), super morbid obesity   now with bowel obstruction s/p laparotomy and lysis of adhesions.   Assessment & Plan    Persistent atrial fib- though now freq SR --on eliquis --on amiodarone -po  --oral lopressor started ok per surgery --SR off and on  last 3 days.  --hx [facility] 01/01/23 with ERAF no plans to repeat this admit.   Chronic diastolic CHF NYHA III-IV. Symptoms related to HF and weight. Daily weights. Keep K>4, Mg>2 Add lasix back after taking PO Home dose was 80 mg daily will resume I/O neg 14,372 since admit and wt down from 176.8 to 167.7  Kg  (20 lbs) wt up today by about 4Kg.  Add losartan back home dose    Morbid obesity  --understands he would benefit from wt loss.     BO status post laparotomy and lysis of adhesions.  Improving bowel function    HTN controlled      For questions or updates, please contact [facility] Please consult [email or web address] for contact info under        Signed, Nada Boozer, NP  01/08/2023, 9:17 AM

## 2023-01-08 NOTE — PMR Pre-admission (Signed)
PMR Admission Coordinator Pre-Admission Assessment   Patient: Timothy Macias is an 61 y.o., male MRN: [id number] DOB: 06/14/1961 Height: 5' 8" (172.7 cm) Weight: (!) 167.7 kg   Insurance Information HMO:     PPO: yes     PCP:      IPA:      80/20:      OTHER:  PRIMARY: BCBS      Policy#: [id number]      Subscriber: pt's spouse Serena CM Name: n/a      Phone#:   [phone]   Fax#: [phone] Pre-Cert#: [id number] auth for CIR via faxed approval with updates due to fax listed above on 01/14/23      Employer:  Benefits:  Phone #: [phone]     Name:  Eff. Date: 12/24/22     Deduct: $750 ($0 met)      Out of Pocket Max: $6000 ($0 met)      Life Max: n/a CIR: 70%      SNF: 70% Outpatient: 70%     Co-Ins: 30% Home Health: 70%      Co-Ins: 30% DME: 70%     Co-Ins: 30% Providers:  SECONDARY: Medicare A/B      Policy#: [id number]      Phone#:    Financial Counselor:       Phone#:    The "Data Collection Information Summary" for patients in Inpatient Rehabilitation Facilities with attached "Privacy Act Statement-Health Care Records" was provided and verbally reviewed with: Patient and Family   Emergency Contact Information Contact Information       Name Relation Home Work Mobile    Debrosse,Serena Spouse [phone]   [phone]    Howell,Robert Brother     [phone]           Current Medical History  Patient Admitting Diagnosis: debility 2/2 SBO and afib   History of Present Illness: Pt is a 61 y/o male with PMH of afib, HTN, previous SBO 2/2 strangulated peri-umbilical hernia s/p ex-lap, and OSA on cpap who presented initially to [facility] ED on 12/28/22 with c/o of palpitations, SOB, and fluid retention/weight gain of 43 pounds over 1-2 weeks.  In ED pt was 87% on room air, started on 3L oxygen via nasal cannula, and afib with RVR rates to 150s, troponins WNL, BP soft, mag 1.8, potassium 3.5, creatinine 2.4, Hgb 17.3.  Cardiology consulted and recommended amio drip and  transfer to [facility] on 12/28/22.  Pt also noted abdominal distention x3 days with decreased appetite with vomiting "brown gross fluid".  Surgery was consulted and plain film imaging noted concern for SBO.  NG tube was placed for conservative management, but ultimately pt required surgical management with ex-lap, lysis of adhesions, and small bowel resection with wound vac placement, which occurred on 1/10 per Dr. Wakefield.  Pt underwent DCCV for afib on 1/9 per Dr. [place].  Pt has been progressed to a soft diet and is tolerating.  Monitoring WBCs, which are elevated, but currently no other signs of infection.  Vac changes MWF.  Therapy ongoing and pt was recommended for CIR.    Patient's medical record from [facility] has been reviewed by the rehabilitation admission coordinator and physician.   Past Medical History      Past Medical History:  Diagnosis Date   Atrial fibrillation (HCC)     CHF (congestive heart failure) (HCC)     Fatty liver     Hypertension     Sleep apnea        wears CPAP      Has the patient had major surgery during 100 days prior to admission? Yes   Family History   family history includes Bone cancer in his mother; Other in his father.   Current Medications   Current Facility-Administered Medications:    (feeding supplement) PROSource Plus liquid 30 mL, 30 mL, Oral, TID BM, Agarwala, Ravi, MD, 30 mL at 01/08/23 0905   acetaminophen (TYLENOL) tablet 1,000 mg, 1,000 mg, Oral, Q8H, Johnson, Kelly R, PA-C, 1,000 mg at 01/08/23 0521   amiodarone (PACERONE) tablet 400 mg, 400 mg, Oral, Daily, , Tiffany, MD   artificial tears (LACRILUBE) ophthalmic ointment, , Both Eyes, Q4H PRN, Agarwala, Ravi, MD   Chlorhexidine Gluconate Cloth 2 % PADS 6 each, 6 each, Topical, Daily, Osborne, Kelly, PA-C, 6 each at 01/07/23 1000   cyclobenzaprine (FLEXERIL) tablet 10 mg, 10 mg, Oral, TID PRN, Johnson, Kelly R, PA-C   DULoxetine (CYMBALTA) DR capsule 60 mg, 60 mg, Oral, BID,  Johnson, Kelly R, PA-C, 60 mg at 01/08/23 0904   feeding supplement (BOOST / RESOURCE BREEZE) liquid 1 Container, 1 Container, Oral, TID BM, Agarwala, Ravi, MD, 1 Container at 01/05/23 1000   ferrous sulfate tablet 325 mg, 325 mg, Oral, Q breakfast, Johnson, Kelly R, PA-C, 325 mg at 01/08/23 0903   furosemide (LASIX) tablet 80 mg, 80 mg, Oral, Daily, Ingold, Laura R, NP   heparin injection 5,000 Units, 5,000 Units, Subcutaneous, Q8H, Agarwala, Ravi, MD, 5,000 Units at 01/08/23 0521   HYDROmorphone (DILAUDID) injection 0.5 mg, 0.5 mg, Intravenous, Q2H PRN, Johnson, Kelly R, PA-C, 0.5 mg at 01/08/23 0534   levalbuterol (XOPENEX) nebulizer solution 0.63 mg, 0.63 mg, Nebulization, Q6H PRN, Agarwala, Ravi, MD   liver oil-zinc oxide (DESITIN) 40 % ointment, , Topical, Q4H PRN, Agarwala, Ravi, MD, Given at 01/07/23 1228   losartan (COZAAR) tablet 12.5 mg, 12.5 mg, Oral, Daily, Ingold, Laura R, NP   menthol-cetylpyridinium (CEPACOL) lozenge 3 mg, 1 lozenge, Oral, PRN, Osborne, Kelly, PA-C   metoprolol succinate (TOPROL-XL) 24 hr tablet 100 mg, 100 mg, Oral, Daily, Agarwala, Ravi, MD, 100 mg at 01/08/23 0903   metoprolol tartrate (LOPRESSOR) injection 5 mg, 5 mg, Intravenous, Q3H PRN, Osborne, Kelly, PA-C, 5 mg at 01/04/23 1429   ondansetron (ZOFRAN) injection 4 mg, 4 mg, Intravenous, Q6H PRN, Osborne, Kelly, PA-C, 4 mg at 01/08/23 0521   Oral care mouth rinse, 15 mL, Mouth Rinse, PRN, Agarwala, Ravi, MD   oxyCODONE (Oxy IR/ROXICODONE) immediate release tablet 60 mg, 60 mg, Oral, TID PRN, Johnson, Kelly R, PA-C, 60 mg at 01/08/23 0901   pantoprazole (PROTONIX) injection 40 mg, 40 mg, Intravenous, Q24H, Osborne, Kelly, PA-C, 40 mg at 01/07/23 2144   phenol (CHLORASEPTIC) mouth spray 1 spray, 1 spray, Mouth/Throat, PRN, Osborne, Kelly, PA-C, 1 spray at 01/01/23 0955   pregabalin (LYRICA) capsule 100 mg, 100 mg, Oral, Daily, Johnson, Kelly R, PA-C, 100 mg at 01/08/23 0903   revefenacin (YUPELRI) nebulizer  solution 175 mcg, 175 mcg, Nebulization, Daily, Osborne, Kelly, PA-C, 175 mcg at 01/08/23 0815   simethicone (MYLICON) chewable tablet 80 mg, 80 mg, Oral, Q6H PRN, Agarwala, Ravi, MD, 80 mg at 01/08/23 1000   tamsulosin (FLOMAX) capsule 0.4 mg, 0.4 mg, Oral, Daily, Osborne, Kelly, PA-C, 0.4 mg at 01/08/23 0903   traMADol (ULTRAM) tablet 50 mg, 50 mg, Oral, Q6H PRN, Johnson, Kelly R, PA-C   Patients Current Diet:  Diet Order                    DIET SOFT Room service appropriate? Yes; Fluid consistency: Thin  Diet effective now                         Precautions / Restrictions Precautions Precautions: Fall, Other (comment) Precaution Comments: abdominal wound Other Brace: bilat AFO's Restrictions Weight Bearing Restrictions: No    Has the patient had 2 or more falls or a fall with injury in the past year? No   Prior Activity Level Household: uses bari walker at baseline, wife assists with LB dressing and bathing, and bed mobility   Prior Functional Level Self Care: Did the patient need help bathing, dressing, using the toilet or eating? Needed some help   Indoor Mobility: Did the patient need assistance with walking from room to room (with or without device)? Independent   Stairs: Did the patient need assistance with internal or external stairs (with or without device)? Needed some help   Functional Cognition: Did the patient need help planning regular tasks such as shopping or remembering to take medications? Needed some help   Patient Information Are you of Hispanic, Latino/a,or Spanish origin?: A. No, not of Hispanic, Latino/a, or Spanish origin What is your race?: A. White Do you need or want an interpreter to communicate with a doctor or health care staff?: 0. No   Patient's Response To:  Health Literacy and Transportation Is the patient able to respond to health literacy and transportation needs?: Yes Health Literacy - How often do you need to have someone help you  when you read instructions, pamphlets, or other written material from your doctor or pharmacy?: Never In the past 12 months, has lack of transportation kept you from medical appointments or from getting medications?: No In the past 12 months, has lack of transportation kept you from meetings, work, or from getting things needed for daily living?: No   Home Assistive Devices / Equipment Home Equipment: Shower seat - built in, Grab bars - tub/shower, Other (comment), Wheelchair - manual, Cane - single point, BSC/3in1, Rolling Walker (2 wheels), Rollator (4 wheels)   Prior Device Use: Indicate devices/aids used by the patient prior to current illness, exacerbation or injury? Manual wheelchair and Walker   Current Functional Level Cognition   Overall Cognitive Status: Within Functional Limits for tasks assessed Orientation Level: Oriented X4    Extremity Assessment (includes Sensation/Coordination)   Upper Extremity Assessment: Generalized weakness  Lower Extremity Assessment: Defer to PT evaluation RLE Deficits / Details: Hx foot drop, quad strength grossly 2+/5 RLE Sensation: history of peripheral neuropathy LLE Deficits / Details: Hx foot drop, quad strength grossly 2+/5 LLE Sensation: history of peripheral neuropathy     ADLs   Overall ADL's : Needs assistance/impaired Eating/Feeding: Independent, Sitting Eating/Feeding Details (indicate cue type and reason): pt now on soft diet Grooming: Oral care, Sitting, Set up Upper Body Bathing: Minimal assistance, Standing Lower Body Bathing: Maximal assistance, Sit to/from stand Upper Body Dressing : Minimal assistance, Bed level Lower Body Dressing: Total assistance, Bed level Lower Body Dressing Details (indicate cue type and reason): socks, shoes and AFOs Toilet Transfer: Minimal assistance, +2 for safety/equipment, +2 for physical assistance, BSC/3in1, Stand-pivot Toileting- Clothing Manipulation and Hygiene: Maximal assistance, Sit  to/from stand Functional mobility during ADLs: Moderate assistance, +2 for physical assistance, +2 for safety/equipment General ADL Comments: assist due to body habitus, elevated HR< poor activity tolerance, NG and [place] tubing     Mobility   Overal bed mobility: Needs Assistance Bed Mobility:   Sit to Sidelying, Rolling Rolling: Supervision Sidelying to sit: +2 for physical assistance, +2 for safety/equipment, Mod assist Supine to sit: Mod assist, +2 for physical assistance Sit to sidelying: +2 for physical assistance, Max assist General bed mobility comments: cues for technique, assist to guide shoulders and for LEs back into bed     Transfers   Overall transfer level: Needs assistance Equipment used: Rolling walker (2 wheels) Transfers: Sit to/from Stand, Bed to chair/wheelchair/BSC Sit to Stand: +2 physical assistance, Mod assist Bed to/from chair/wheelchair/BSC transfer type:: Step pivot Step pivot transfers: +2 physical assistance, Min assist Transfer via Lift Equipment: [place] General transfer comment: assist to rise and stedy, 3rd person to stabilize walker     Ambulation / Gait / Stairs / Wheelchair Mobility   Ambulation/Gait General Gait Details: unable to take steps more than to transition to chair Gait velocity: reduced Gait velocity interpretation: <1.31 ft/sec, indicative of household ambulator Pre-gait activities: standing balance and posture cues     Posture / Balance Balance Overall balance assessment: Needs assistance Sitting-balance support: Feet supported, No upper extremity supported Sitting balance-Leahy Scale: Fair Standing balance support: Bilateral upper extremity supported Standing balance-Leahy Scale: Poor     Special needs/care consideration CPAP, Wound Vac abdomen, and Skin abdominal incision    Previous Home Environment (from acute therapy documentation) Living Arrangements: Spouse/significant other Available Help at Discharge: Family, Available  PRN/intermittently Type of Home: House Home Layout: One level Home Access: Ramped entrance Bathroom Shower/Tub: Walk-in shower Bathroom Toilet: Handicapped height   Discharge Living Setting Plans for Discharge Living Setting: Patient's home, Lives with (comment) (spouse) Type of Home at Discharge: House Discharge Home Layout: One level Discharge Home Access: Ramped entrance Discharge Bathroom Shower/Tub: Walk-in shower Discharge Bathroom Toilet: Handicapped height Discharge Bathroom Accessibility: Yes How Accessible: Accessible via walker Does the patient have any problems obtaining your medications?: No   Social/Family/Support Systems Patient Roles: Spouse Anticipated Caregiver: Serena (spouse) Anticipated Caregiver's Contact Information: [phone] Ability/Limitations of Caregiver: min assist Caregiver Availability: 24/7 Discharge Plan Discussed with Primary Caregiver: Yes Is Caregiver In Agreement with Plan?: Yes Does Caregiver/Family have Issues with Lodging/Transportation while Pt is in Rehab?: No   Goals Patient/Family Goal for Rehab: PT/OT supervision to min assist, SLP n/a Expected length of stay: 12-14 days Pt/Family Agrees to Admission and willing to participate: Yes Program Orientation Provided & Reviewed with Pt/Caregiver Including Roles  & Responsibilities: Yes  Barriers to Discharge: Home environment access/layout, Insurance for SNF coverage   Decrease burden of Care through IP rehab admission: n/a   Possible need for SNF placement upon discharge: Not anticipated.  Wife able to take some time off work if pt needs 24/7 supervision/assist.    Patient Condition: I have reviewed medical records from [facility], spoken with CM, and patient and spouse. I discussed via phone for inpatient rehabilitation assessment.  Patient will benefit from ongoing PT and OT, can actively participate in 3 hours of therapy a day 5 days of the week, and can make measurable gains during  the admission.  Patient will also benefit from the coordinated team approach during an Inpatient Acute Rehabilitation admission.  The patient will receive intensive therapy as well as Rehabilitation physician, nursing, social worker, and care management interventions.  Due to bowel management, safety, skin/wound care, disease management, medication administration, pain management, and patient education the patient requires 24 hour a day rehabilitation nursing.  The patient is currently min to mod assist +2 with mobility and basic ADLs.  Discharge setting and   therapy post discharge at home with home health is anticipated.  Patient has agreed to participate in the Acute Inpatient Rehabilitation Program and will admit today.   Preadmission Screen Completed By:  Caitlin E Warren, PT, DPT 01/08/2023 10:14 AM ______________________________________________________________________   Discussed status with Dr. Shaquanta Harkless on 01/08/23  at 10:26 AM  and received approval for admission today.   Admission Coordinator:  Caitlin E Warren, PT, DPT time 10:26 AM /Date 01/08/23     Assessment/Plan: Diagnosis: Does the need for close, 24 hr/day Medical supervision in concert with the patient's rehab needs make it unreasonable for this patient to be served in a less intensive setting? Yes Co-Morbidities requiring supervision/potential complications: chronic pain on high doses of opiates, SBO; s/p hernia repair; s/p ex-lap with VAC; Afib, OSA, CHF exacerbation; BMI 56 Due to bladder management, bowel management, safety, skin/wound care, disease management, medication administration, pain management, and patient education, does the patient require 24 hr/day rehab nursing? Yes Does the patient require coordinated care of a physician, rehab nurse, PT, OT, and SLP to address physical and functional deficits in the context of the above medical diagnosis(es)? Yes Addressing deficits in the following areas: balance, endurance,  locomotion, strength, transferring, bowel/bladder control, bathing, dressing, feeding, grooming, and toileting Can the patient actively participate in an intensive therapy program of at least 3 hrs of therapy 5 days a week? Yes The potential for patient to make measurable gains while on inpatient rehab is good Anticipated functional outcomes upon discharge from inpatient rehab: supervision and min assist PT, supervision and min assist OT, n/a SLP Estimated rehab length of stay to reach the above functional goals is: 12-14 days Anticipated discharge destination: Home 10. Overall Rehab/Functional Prognosis: good     MD Signature:   

## 2023-01-08 NOTE — Progress Notes (Signed)
Orthopedic Tech Progress Note Patient Details:  Lorene Samaan November 11, 1961 [id number] Bilateral rehab prafo boots have been ordered from [facility]  Patient ID: Wynn Maudlin, male   DOB: 1960-12-31, 62 y.o.   MRN: [id number]  Jearld Lesch 01/08/2023, 5:53 PM

## 2023-01-08 NOTE — Progress Notes (Signed)
PMR Admission Coordinator Pre-Admission Assessment   Patient: Timothy Macias is an 62 y.o., male MRN: [id number] DOB: 03/14/1961 Height: 5\' 8"  (172.7 cm) Weight: (!) 167.7 kg   Insurance Information HMO:     PPO: yes     PCP:      IPA:      80/20:      OTHER:  PRIMARY: BCBS      Policy#: [id number]      Subscriber: pt's spouse [name] CM Name: n/a      Phone#:   [phone]   Fax#: [phone] Pre-Cert#: [id number] auth for CIR via faxed approval with updates due to fax listed above on 01/14/23      Employer:  Benefits:  Phone #: [phone]     Name:  Eff. Date: 12/24/22     Deduct: $750 ($0 met)      Out of Pocket Max: $6000 ($0 met)      Life Max: n/a CIR: 70%      SNF: 70% Outpatient: 70%     Co-Ins: 30% Home Health: 70%      Co-Ins: 30% DME: 70%     Co-Ins: 30% Providers:  SECONDARY: Medicare A/B      Policy#: [id number]      Phone#:    Financial Counselor:       Phone#:    The "Data Collection Information Summary" for patients in Inpatient Rehabilitation Facilities with attached "Privacy Act [place] Records" was provided and verbally reviewed with: Patient and Family   Emergency Contact Information Contact Information       Name Relation Home Work [place] Spouse [phone]   [phone]    Hiram Gash     [phone]           Current Medical History  Patient Admitting Diagnosis: debility 2/2 SBO and afib   History of Present Illness: Pt is a 62 y/o male with PMH of afib, HTN, previous SBO 2/2 strangulated peri-umbilical hernia s/p ex-lap, and OSA on cpap who presented initially to [facility] ED on 12/28/22 with c/o of palpitations, SOB, and fluid retention/weight gain of 43 pounds over 1-2 weeks.  In ED pt was 87% on room air, started on 3L oxygen via nasal cannula, and afib with RVR rates to 150s, troponins WNL, BP soft, mag 1.8, potassium 3.5, creatinine 2.4, Hgb 17.3.  Cardiology consulted and recommended amio drip and  transfer to [facility] on 12/28/22.  Pt also noted abdominal distention x3 days with decreased appetite with vomiting "brown gross fluid".  Surgery was consulted and plain film imaging noted concern for SBO.  NG tube was placed for conservative management, but ultimately pt required surgical management with ex-lap, lysis of adhesions, and small bowel resection with wound vac placement, which occurred on 1/10 per Dr. Donne Hazel.  Pt underwent DCCV for afib on 1/9 per Dr. Oval Linsey.  Pt has been progressed to a soft diet and is tolerating.  Monitoring WBCs, which are elevated, but currently no other signs of infection.  Vac changes MWF.  Therapy ongoing and pt was recommended for CIR.    Patient's medical record from [facility] has been reviewed by the rehabilitation admission coordinator and physician.   Past Medical History      Past Medical History:  Diagnosis Date   Atrial fibrillation (HCC)     CHF (congestive heart failure) ([place])     Fatty liver     Hypertension     Sleep apnea  wears CPAP      Has the patient had major surgery during 100 days prior to admission? Yes   Family History   family history includes Bone cancer in his mother; Other in his father.   Current Medications   Current Facility-Administered Medications:    (feeding supplement) PROSource Plus liquid 30 mL, 30 mL, Oral, TID BM, Agarwala, Ravi, MD, 30 mL at 01/08/23 0905   acetaminophen (TYLENOL) tablet 1,000 mg, 1,000 mg, Oral, Q8H, Norm Parcel, PA-C, 1,000 mg at 01/08/23 8341   amiodarone (PACERONE) tablet 400 mg, 400 mg, Oral, Daily, Skeet Latch, MD   artificial tears (LACRILUBE) ophthalmic ointment, , Both Eyes, Q4H PRN, Agarwala, Ravi, MD   Chlorhexidine Gluconate Cloth 2 % PADS 6 each, 6 each, Topical, Daily, Saverio Danker, PA-C, 6 each at 01/07/23 1000   cyclobenzaprine (FLEXERIL) tablet 10 mg, 10 mg, Oral, TID PRN, Norm Parcel, PA-C   DULoxetine (CYMBALTA) DR capsule 60 mg, 60 mg, Oral, BID,  Norm Parcel, PA-C, 60 mg at 01/08/23 9622   feeding supplement (BOOST / RESOURCE BREEZE) liquid 1 Container, 1 Container, Oral, TID BM, Kipp Brood, MD, 1 Container at 01/05/23 1000   ferrous sulfate tablet 325 mg, 325 mg, Oral, Q breakfast, Norm Parcel, PA-C, 325 mg at 01/08/23 2979   furosemide (LASIX) tablet 80 mg, 80 mg, Oral, Daily, Cecilie Kicks R, NP   heparin injection 5,000 Units, 5,000 Units, Subcutaneous, Q8H, Agarwala, Ravi, MD, 5,000 Units at 01/08/23 0521   HYDROmorphone (DILAUDID) injection 0.5 mg, 0.5 mg, Intravenous, Q2H PRN, Norm Parcel, PA-C, 0.5 mg at 01/08/23 0534   levalbuterol (XOPENEX) nebulizer solution 0.63 mg, 0.63 mg, Nebulization, Q6H PRN, Kipp Brood, MD   liver oil-zinc oxide (DESITIN) 40 % ointment, , Topical, Q4H PRN, Agarwala, Ravi, MD, Given at 01/07/23 1228   losartan (COZAAR) tablet 12.5 mg, 12.5 mg, Oral, Daily, Isaiah Serge, NP   menthol-cetylpyridinium (CEPACOL) lozenge 3 mg, 1 lozenge, Oral, PRN, Saverio Danker, PA-C   metoprolol succinate (TOPROL-XL) 24 hr tablet 100 mg, 100 mg, Oral, Daily, Agarwala, Ravi, MD, 100 mg at 01/08/23 0903   metoprolol tartrate (LOPRESSOR) injection 5 mg, 5 mg, Intravenous, Q3H PRN, Saverio Danker, PA-C, 5 mg at 01/04/23 1429   ondansetron (ZOFRAN) injection 4 mg, 4 mg, Intravenous, Q6H PRN, Saverio Danker, PA-C, 4 mg at 01/08/23 8921   Oral care mouth rinse, 15 mL, Mouth Rinse, PRN, Agarwala, Ravi, MD   oxyCODONE (Oxy IR/ROXICODONE) immediate release tablet 60 mg, 60 mg, Oral, TID PRN, Norm Parcel, PA-C, 60 mg at 01/08/23 0901   pantoprazole (PROTONIX) injection 40 mg, 40 mg, Intravenous, Q24H, Saverio Danker, PA-C, 40 mg at 01/07/23 2144   phenol (CHLORASEPTIC) mouth spray 1 spray, 1 spray, Mouth/Throat, PRN, Saverio Danker, PA-C, 1 spray at 01/01/23 0955   pregabalin (LYRICA) capsule 100 mg, 100 mg, Oral, Daily, Norm Parcel, PA-C, 100 mg at 01/08/23 1941   revefenacin (YUPELRI) nebulizer  solution 175 mcg, 175 mcg, Nebulization, Daily, Saverio Danker, PA-C, 175 mcg at 01/08/23 0815   simethicone (MYLICON) chewable tablet 80 mg, 80 mg, Oral, Q6H PRN, Agarwala, Ravi, MD, 80 mg at 01/08/23 1000   tamsulosin (FLOMAX) capsule 0.4 mg, 0.4 mg, Oral, Daily, Saverio Danker, PA-C, 0.4 mg at 01/08/23 7408   traMADol (ULTRAM) tablet 50 mg, 50 mg, Oral, Q6H PRN, Norm Parcel, PA-C   Patients Current Diet:  Diet Order  DIET SOFT Room service appropriate? Yes; Fluid consistency: Thin  Diet effective now                         Precautions / Restrictions Precautions Precautions: Fall, Other (comment) Precaution Comments: abdominal wound Other Brace: bilat AFO's Restrictions Weight Bearing Restrictions: No    Has the patient had 2 or more falls or a fall with injury in the past year? No   Prior Activity Level Household: uses bari walker at baseline, wife assists with LB dressing and bathing, and bed mobility   Prior Functional Level Self Care: Did the patient need help bathing, dressing, using the toilet or eating? Needed some help   Indoor Mobility: Did the patient need assistance with walking from room to room (with or without device)? Independent   Stairs: Did the patient need assistance with internal or external stairs (with or without device)? Needed some help   Functional Cognition: Did the patient need help planning regular tasks such as shopping or remembering to take medications? Needed some help   Patient Information Are you of Hispanic, Latino/a,or Spanish origin?: A. No, not of Hispanic, Latino/a, or Spanish origin What is your race?: A. White Do you need or want an interpreter to communicate with a doctor or health care staff?: 0. No   Patient's Response To:  Health Literacy and Transportation Is the patient able to respond to health literacy and transportation needs?: Yes Health Literacy - How often do you need to have someone help you  when you read instructions, pamphlets, or other written material from your doctor or pharmacy?: Never In the past 12 months, has lack of transportation kept you from medical appointments or from getting medications?: No In the past 12 months, has lack of transportation kept you from meetings, work, or from getting things needed for daily living?: No   Journalist, newspaper / Equipment Home Equipment: Information systems manager - built in, [employer] - tub/shower, Other (comment), Wheelchair - manual, Cane - single point, BSC/3in1, Agricultural consultant (2 wheels), Rollator (4 wheels)   Prior Device Use: Indicate devices/aids used by the patient prior to current illness, exacerbation or injury? Manual wheelchair and Walker   Current Functional Level Cognition   Overall Cognitive Status: Within Functional Limits for tasks assessed Orientation Level: Oriented X4    Extremity Assessment (includes Sensation/Coordination)   Upper Extremity Assessment: Generalized weakness  Lower Extremity Assessment: Defer to PT evaluation RLE Deficits / Details: Hx foot drop, quad strength grossly 2+/5 RLE Sensation: history of peripheral neuropathy LLE Deficits / Details: Hx foot drop, quad strength grossly 2+/5 LLE Sensation: history of peripheral neuropathy     ADLs   Overall ADL's : Needs assistance/impaired Eating/Feeding: Independent, Sitting Eating/Feeding Details (indicate cue type and reason): pt now on soft diet Grooming: Oral care, Sitting, Set up Upper Body Bathing: Minimal assistance, Standing Lower Body Bathing: Maximal assistance, Sit to/from stand Upper Body Dressing : Minimal assistance, Bed level Lower Body Dressing: Total assistance, Bed level Lower Body Dressing Details (indicate cue type and reason): socks, shoes and AFOs Toilet Transfer: Minimal assistance, +2 for safety/equipment, +2 for physical assistance, BSC/3in1, Stand-pivot Toileting- Clothing Manipulation and Hygiene: Maximal assistance, Sit  to/from stand Functional mobility during ADLs: Moderate assistance, +2 for physical assistance, +2 for safety/equipment General ADL Comments: assist due to body habitus, elevated HR< poor activity tolerance, NG and [place] tubing     Mobility   Overal bed mobility: Needs Assistance Bed Mobility:  Sit to Sidelying, Rolling Rolling: Supervision Sidelying to sit: +2 for physical assistance, +2 for safety/equipment, Mod assist Supine to sit: Mod assist, +2 for physical assistance Sit to sidelying: +2 for physical assistance, Max assist General bed mobility comments: cues for technique, assist to guide shoulders and for LEs back into bed     Transfers   Overall transfer level: Needs assistance Equipment used: Rolling walker (2 wheels) Transfers: Sit to/from Stand, Bed to chair/wheelchair/BSC Sit to Stand: +2 physical assistance, Mod assist Bed to/from chair/wheelchair/BSC transfer type:: Step pivot Step pivot transfers: +2 physical assistance, Min assist Transfer via Lift Equipment: [place] General transfer comment: assist to rise and stedy, 3rd person to stabilize walker     Ambulation / Gait / Stairs / Wheelchair Mobility   Ambulation/Gait General Gait Details: unable to take steps more than to transition to chair Gait velocity: reduced Gait velocity interpretation: <1.31 ft/sec, indicative of household ambulator Pre-gait activities: standing balance and posture cues     Posture / Balance Balance Overall balance assessment: Needs assistance Sitting-balance support: Feet supported, No upper extremity supported Sitting balance-Leahy Scale: Fair Standing balance support: Bilateral upper extremity supported Standing balance-Leahy Scale: Poor     Special needs/care consideration CPAP, Wound Vac abdomen, and Skin abdominal incision    Previous Home Environment (from acute therapy documentation) Living Arrangements: Spouse/significant other Available Help at Discharge: Family, Available  PRN/intermittently Type of Home: House Home Layout: One level Home Access: Ramped entrance Bathroom Shower/Tub: Health visitor: Handicapped height   Discharge Living Setting Plans for Discharge Living Setting: Patient's home, Lives with (comment) (spouse) Type of Home at Discharge: House Discharge Home Layout: One level Discharge Home Access: Ramped entrance Discharge Bathroom Shower/Tub: Walk-in shower Discharge Bathroom Toilet: Handicapped height Discharge Bathroom Accessibility: Yes How Accessible: Accessible via walker Does the patient have any problems obtaining your medications?: No   Social/Family/Support Systems Patient Roles: Spouse Anticipated Caregiver: [name] (spouse) Anticipated Caregiver's Contact Information: [phone] Ability/Limitations of Caregiver: min assist Caregiver Availability: 24/7 Discharge Plan Discussed with Primary Caregiver: Yes Is Caregiver In Agreement with Plan?: Yes Does Caregiver/Family have Issues with Lodging/Transportation while Pt is in Rehab?: No   Goals Patient/Family Goal for Rehab: PT/OT supervision to min assist, SLP n/a Expected length of stay: 12-14 days Pt/Family Agrees to Admission and willing to participate: Yes Program Orientation Provided & Reviewed with Pt/Caregiver Including Roles  & Responsibilities: Yes  Barriers to Discharge: Home environment access/layout, Insurance for SNF coverage   Decrease burden of Care through IP rehab admission: n/a   Possible need for SNF placement upon discharge: Not anticipated.  Wife able to take some time off work if pt needs 24/7 supervision/assist.    Patient Condition: I have reviewed medical records from [facility], spoken with CM, and patient and spouse. I discussed via phone for inpatient rehabilitation assessment.  Patient will benefit from ongoing PT and OT, can actively participate in 3 hours of therapy a day 5 days of the week, and can make measurable gains during  the admission.  Patient will also benefit from the coordinated team approach during an Inpatient Acute Rehabilitation admission.  The patient will receive intensive therapy as well as Rehabilitation physician, nursing, social worker, and care management interventions.  Due to bowel management, safety, skin/wound care, disease management, medication administration, pain management, and patient education the patient requires 24 hour a day rehabilitation nursing.  The patient is currently min to mod assist +2 with mobility and basic ADLs.  Discharge setting and  therapy post discharge at home with home health is anticipated.  Patient has agreed to participate in the Acute Inpatient Rehabilitation Program and will admit today.   Preadmission Screen Completed By:  Stephania Fragmin, PT, DPT 01/08/2023 10:14 AM ______________________________________________________________________   Discussed status with Dr. Berline Chough on 01/08/23  at 10:26 AM  and received approval for admission today.   Admission Coordinator:  Stephania Fragmin, PT, DPT time 10:26 AM Dorna Bloom 01/08/23     Assessment/Plan: Diagnosis: Does the need for close, 24 hr/day Medical supervision in concert with the patient's rehab needs make it unreasonable for this patient to be served in a less intensive setting? Yes Co-Morbidities requiring supervision/potential complications: chronic pain on high doses of opiates, SBO; s/p hernia repair; s/p ex-lap with VAC; Afib, OSA, CHF exacerbation; BMI 56 Due to bladder management, bowel management, safety, skin/wound care, disease management, medication administration, pain management, and patient education, does the patient require 24 hr/day rehab nursing? Yes Does the patient require coordinated care of a physician, rehab nurse, PT, OT, and SLP to address physical and functional deficits in the context of the above medical diagnosis(es)? Yes Addressing deficits in the following areas: balance, endurance,  locomotion, strength, transferring, bowel/bladder control, bathing, dressing, feeding, grooming, and toileting Can the patient actively participate in an intensive therapy program of at least 3 hrs of therapy 5 days a week? Yes The potential for patient to make measurable gains while on inpatient rehab is good Anticipated functional outcomes upon discharge from inpatient rehab: supervision and min assist PT, supervision and min assist OT, n/a SLP Estimated rehab length of stay to reach the above functional goals is: 12-14 days Anticipated discharge destination: Home 10. Overall Rehab/Functional Prognosis: good     MD Signature:

## 2023-01-08 NOTE — H&P (Signed)
Physical Medicine and Rehabilitation Admission H&P     CC: Debility secondary to SBO s/p laparotomy, HFrEF   HPI: Timothy Macias is a 39 year R handed old male who presented to his PCP on 12/29/2023 complaining of SOB and rapid weight gain with history of HFrEF and atrial fibrillation on Eliquis. Transported via EMS to [facility] ED. Found to be in rapid atrial fibrillation admitted to hospitalist service. Mild hypoxia supplemented with 3L O2 via [place]. Amiodarone infusion started. Labs consistent with AKI, elevated hemoglobin. Episode of emesis with nausea and lack of appetite. Transferred to [facility]. Lethargic and RR called on 1/06. A fib given dig, Lasix and BB. Cardiology consulted. HFrEF 45% secondary to NICM. Notes indicated alcohol intake is 6 beers daily for ~45 years.   Noted abdominal distention and wife stated the patient had undergone incarcerated open umbilical hernia repair at [facility] in 2023. KUB with dilated SB loops and general surgery consulted. NGT place with 3 liters of output. Eliquis held and CT abdomen and pelvis obtained. RR called 1/07 due to decreased LOC. Transferred to ICU and PCCM consulted. Eliquis held and heparin infusion started. Required cardioversion 1/09. No improvement in SBO with small bowel protocol and underwent exploratory laparotomy with lysis of adhesions and small bowel resection by Dr. Donne Hazel on 1/10. Fascia was closed and wound VAC placed. Remained on vent post-op. Skin edge bleeding vessel required suturing at bedside on 1/12. Heparin infusion stopped and heparin [place] started for DVT prophylaxis. NGT removed and given ice chips on 1/12. Diet advanced to soft. Restarted Lasix 80 mg and losartan. VAC dressing to be changed every M/W/F. To restart Eliquis when approved by surgical team. The patient requires inpatient physical medicine and rehabilitation evaluations and treatment secondary to dysfunction due to exploratory laparotomy, SBO, heart failure.   Primary  complaint is dyspepsia. Takes Protonix every evening.  Loose stools and requesting probiotics. Dose of Flomax at home is 0.8 mg daily.  Does not use any maintenance inhalers at home. Albuterol and Breztri cause unpleasant feeling.      PMH includes bilateral nephrolithiasis and bilateral renal cysts, depression, reflux esophagitis, peripheral neuropathy, lumbar spinal stenosis, OSA, arthritis, hypertension. Diagnosed with atrial fibrillation in 2016 and underwent ablation at [facility] in 2020. Diagnosed with HF in 2020 and follows with Dr. Jodelle Gross Assar. On chronic opioids, benzodiazepines per PCP, Dr. Gar Ponto.   Pt reports pain adequately controlled;  Pt reports has been on Oxycodone 60 mg TID for 2-3 years, but taking Oxy 30 mg tabs for 2-3 months- for chronic back and joint pain.  Has his CPAP from home to use.  Has B/L carbon AFOs from home- chronic for B/L severe peripheral neuropathy causing B/L Foot drop.    Notes hasn't been able to "find his penis for 3+ years" and pees standing over a bucket at home prior- so doesn't know how can use Urinal here.      Review of Systems  Constitutional:  Negative for chills and fever.  HENT:  Negative for congestion and sore throat.   Eyes: Negative.  Negative for photophobia.  Respiratory:  Negative for cough and shortness of breath.   Cardiovascular:  Negative for chest pain and palpitations.  Gastrointestinal:  Positive for heartburn. Negative for constipation, nausea and vomiting.       Loose stools  Genitourinary:  Negative for dysuria, hematuria and urgency.  Musculoskeletal:  Positive for joint pain and myalgias. Negative for falls.  Skin: Negative.  Neurological:  Negative for dizziness and headaches.  Endo/Heme/Allergies: Negative.   Psychiatric/Behavioral:  Negative for depression, hallucinations and suicidal ideas. The patient has insomnia.   All other systems reviewed and are negative.       Past Medical History:  Diagnosis Date    Atrial fibrillation (HCC)     CHF (congestive heart failure) (HCC)     Fatty liver     Hypertension     Sleep apnea      wears CPAP         Past Surgical History:  Procedure Laterality Date   APPLICATION OF WOUND VAC N/A 01/02/2023    Procedure: APPLICATION OF WOUND VAC;  Surgeon: Emelia Loron, MD;  Location: [facility] OR;  Service: General;  Laterality: N/A;   CARDIOVERSION N/A 01/01/2023    Procedure: CARDIOVERSION;  Surgeon: Chilton Si, MD;  Location: [facility] ENDOSCOPY;  Service: Cardiovascular;  Laterality: N/A;   HIP ARTHROPLASTY Right     KNEE ARTHROPLASTY Right     LAPAROTOMY N/A 01/02/2023    Procedure: EXPLORATORY LAPAROTOMY WITH LYSIS OF ADHESIONS WITH SMALL BOWEL RESECTION;  Surgeon: Emelia Loron, MD;  Location: [facility] OR;  Service: General;  Laterality: N/A;   TONSILLECTOMY       WISDOM TOOTH EXTRACTION             Family History  Problem Relation Age of Onset   Bone cancer Mother     Other Father          MVA    Social History:  reports that he has quit smoking. His smoking use included cigarettes. He smoked an average of 1 pack per day. He has never used smokeless tobacco. He reports that he does not currently use alcohol. He reports that he does not use drugs. Allergies:      Allergies  Allergen Reactions   Albuterol Swelling   Amiodarone Rash   Penicillins Hives          Medications Prior to Admission  Medication Sig Dispense Refill   acetaminophen (TYLENOL) 500 MG tablet Take 1,000 mg by mouth every 8 (eight) hours as needed for mild pain.       amiodarone (PACERONE) 200 MG tablet Take 400 mg by mouth daily.       apixaban (ELIQUIS) 5 MG TABS tablet Take 5 mg by mouth daily.       cyclobenzaprine (FLEXERIL) 10 MG tablet Take 10 mg by mouth 3 (three) times daily as needed for muscle spasms.       diphenhydramine-acetaminophen (TYLENOL PM) 25-500 MG TABS tablet Take 2 tablets by mouth at bedtime as needed.       DULoxetine (CYMBALTA) 60 MG capsule Take 60  mg by mouth 2 (two) times daily.       ferrous sulfate 325 (65 FE) MG EC tablet Take 325 mg by mouth daily with breakfast.       folic acid (FOLVITE) 1 MG tablet Take 1 mg by mouth daily.       furosemide (LASIX) 80 MG tablet Take 80 mg by mouth daily.       LORazepam (ATIVAN) 0.5 MG tablet Take 0.5 mg by mouth every 8 (eight) hours as needed for anxiety.       losartan (COZAAR) 25 MG tablet Take 12.5 mg by mouth daily.       Magnesium 400 MG TABS Take 400 mg by mouth daily.       melatonin 5 MG TABS Take 5-10 mg by mouth at  bedtime as needed (sleep).       metoprolol succinate (TOPROL-XL) 25 MG 24 hr tablet Take 75 mg by mouth daily.       Multiple Vitamins-Minerals (YOUR LIFE MULTI ADULT GUMMIES) CHEW Chew by mouth.       oxycodone (ROXICODONE) 30 MG immediate release tablet Take 60 mg by mouth 3 (three) times daily as needed (pain).       pantoprazole (PROTONIX) 40 MG tablet Take 1 tablet by mouth 2 (two) times daily.       potassium chloride SA (K-DUR) 20 MEQ tablet Take 40 mEq by mouth daily.       pregabalin (LYRICA) 100 MG capsule Take 100 mg by mouth at bedtime.       promethazine (PHENERGAN) 25 MG tablet Take 25 mg by mouth every 6 (six) hours as needed for nausea or vomiting.       SUMAtriptan (IMITREX) 100 MG tablet Take 100 mg by mouth every 2 (two) hours as needed for migraine.       tamsulosin (FLOMAX) 0.4 MG CAPS capsule Take 0.8 mg by mouth daily.       testosterone cypionate (DEPOTESTOSTERONE CYPIONATE) 200 MG/ML injection Inject 200 mg into the muscle once a week.              Home: Home Living Family/patient expects to be discharged to:: Private residence Living Arrangements: Spouse/significant other Available Help at Discharge: Family, Available PRN/intermittently Type of Home: House Home Access: [place]: One level Bathroom Shower/Tub: Multimedia programmer: Handicapped height [place]: [place] in, Museum/gallery curator bars -  tub/shower, Other (comment), Wheelchair - manual, Cane - single point, BSC/3in1, Conservation officer, nature (2 wheels), Rollator (4 wheels)   Functional History: Prior Function Prior Level of Function : Needs assist Mobility Comments: walks with bari RW, wife assists with bed mobility, uses WC outside of house ADLs Comments: wife assists with bathing and LB dressing   Functional Status:  Mobility: Bed Mobility Overal bed mobility: Needs Assistance Bed Mobility: Sit to Sidelying, Rolling Rolling: Supervision Sidelying to sit: +2 for physical assistance, +2 for safety/equipment, Mod assist Supine to sit: Mod assist, +2 for physical assistance Sit to sidelying: +2 for physical assistance, Max assist General bed mobility comments: cues for technique, assist to guide shoulders and for LEs back into bed Transfers Overall transfer level: Needs assistance Equipment used: Rolling walker (2 wheels) Transfers: Sit to/from Stand, Bed to chair/wheelchair/BSC Sit to Stand: +2 physical assistance, Mod assist Bed to/from chair/wheelchair/BSC transfer type:: Step pivot Step pivot transfers: +2 physical assistance, Min assist Transfer via Lift Equipment: [place] transfer comment: assist to rise and stedy, 3rd person to stabilize walker Ambulation/Gait General Gait Details: unable to take steps more than to transition to chair Gait velocity: reduced Gait velocity interpretation: <1.31 ft/sec, indicative of household ambulator Pre-gait activities: standing balance and posture cues   ADL: ADL Overall ADL's : Needs assistance/impaired Eating/Feeding: Independent, Sitting Eating/Feeding Details (indicate cue type and reason): pt now on soft diet Grooming: Oral care, Sitting, Set up Upper Body Bathing: Minimal assistance, Standing Lower Body Bathing: Maximal assistance, Sit to/from stand Upper Body Dressing : Minimal assistance, Bed level Lower Body Dressing: Total assistance, Bed level Lower Body  Dressing Details (indicate cue type and reason): socks, shoes and AFOs Toilet Transfer: Minimal assistance, +2 for safety/equipment, +2 for physical assistance, BSC/3in1, Stand-pivot Toileting- Clothing Manipulation and Hygiene: Maximal assistance, Sit to/from stand Functional mobility during ADLs: Moderate assistance, +2 for  physical assistance, +2 for safety/equipment General ADL Comments: assist due to body habitus, elevated HR< poor activity tolerance, NG and [place] tubing   Cognition: Cognition Overall Cognitive Status: Within Functional Limits for tasks assessed Orientation Level: Oriented X4 Cognition Arousal/Alertness: Awake/alert Behavior During Therapy: WFL for tasks assessed/performed Overall Cognitive Status: Within Functional Limits for tasks assessed   Physical Exam: Blood pressure 134/78, pulse 76, temperature 98 F (36.7 C), temperature source Oral, resp. rate (!) 25, height 5\' 8"  (1.727 m), weight (!) 167.7 kg, SpO2 95 %. Physical Exam Vitals and nursing note reviewed. Exam conducted with a chaperone present.  Constitutional:      General: He is not in acute distress.    Appearance: He is obese. He is not ill-appearing.     Comments: Pt sitting up in bed; wife and father in law at bedside; pt has BMI of 53.77 as of today; alert, awake, appropriate, NAD  HENT:     Head: Normocephalic and atraumatic.     Right Ear: External ear normal.     Left Ear: External ear normal.     Nose: Nose normal. No congestion.     Mouth/Throat:     Mouth: Mucous membranes are dry.     Pharynx: No oropharyngeal exudate.  Eyes:     General:        Right eye: No discharge.        Left eye: No discharge.  Cardiovascular:     Rate and Rhythm: Normal rate and regular rhythm.     Heart sounds: Normal heart sounds. No murmur heard.    No gallop.  Pulmonary:     Effort: Pulmonary effort is normal. No respiratory distress.     Breath sounds: Normal breath sounds. No wheezing, rhonchi or rales.      Comments: CTA B/L however with any exertion (I.e exam), started wheezing immediately- dislikes albuterol Abdominal:     General: Bowel sounds are normal.     Comments: Wound VAC in place with good seal. Minimal, thin cherry colored output Incision is vertical to umbilicus with VAC in place- appears to have good granulating tissue underneath that can be seen Firm, rotund abd; NT; protuberant but not distended; normoactive BS  Genitourinary:    Comments: Male purewick in place Musculoskeletal:     Cervical back: Neck supple. No tenderness.     Comments: Ues 4th/5th digit of L hand flexed at MCP and PIP Ue's 5-/5 in arms except FA 4-/5 B/L   LE's HF 4-/5; KE 4+/5 B/L, DF 2-/5 and PF 2-/5 B/L B/L foot drop  Skin:    General: Skin is warm and dry.     Comments: Dry rough patch on R heel; trace bogginess of B/L heels- dressing on heels for prevention Needs PRAFO's.  Has mild MASD between legs, skin folds and groin  Neurological:     Mental Status: He is alert and oriented to person, place, and time.     Sensory: Sensory deficit present.     Motor: Weakness present.     Comments: Severe neuropathy in legs B/L Has absent sensation to light touch to knees B/L- with foot atrophy B/L However still very decreased sensation to light touch from L1 to L2 B/L- absent L3-S2 B/L   Psychiatric:        Mood and Affect: Mood normal.        Behavior: Behavior normal.        Lab Results Last 48 Hours  Results for orders placed or performed during the hospital encounter of 12/28/22 (from the past 48 hour(s))  Glucose, capillary     Status: None    Collection Time: 01/06/23 11:30 AM  Result Value Ref Range    Glucose-Capillary 95 70 - 99 mg/dL      Comment: Glucose reference range applies only to samples taken after fasting for at least 8 hours.  Glucose, capillary     Status: Abnormal    Collection Time: 01/06/23  4:38 PM  Result Value Ref Range    Glucose-Capillary 117 (H) 70 - 99  mg/dL      Comment: Glucose reference range applies only to samples taken after fasting for at least 8 hours.  Glucose, capillary     Status: Abnormal    Collection Time: 01/06/23  7:46 PM  Result Value Ref Range    Glucose-Capillary 133 (H) 70 - 99 mg/dL      Comment: Glucose reference range applies only to samples taken after fasting for at least 8 hours.  Glucose, capillary     Status: None    Collection Time: 01/07/23 12:00 AM  Result Value Ref Range    Glucose-Capillary 97 70 - 99 mg/dL      Comment: Glucose reference range applies only to samples taken after fasting for at least 8 hours.    Comment 1 Notify RN      Comment 2 Document in Chart    CBC     Status: Abnormal    Collection Time: 01/07/23 12:28 AM  Result Value Ref Range    WBC 14.8 (H) 4.0 - 10.5 K/uL    RBC 5.52 4.22 - 5.81 MIL/uL    Hemoglobin 14.2 13.0 - 17.0 g/dL    HCT 45.3 39.0 - 52.0 %    MCV 82.1 80.0 - 100.0 fL    MCH 25.7 (L) 26.0 - 34.0 pg    MCHC 31.3 30.0 - 36.0 g/dL    RDW 19.0 (H) 11.5 - 15.5 %    Platelets 229 150 - 400 K/uL    nRBC 0.0 0.0 - 0.2 %      Comment: Performed at [facility], [place] [address] [place], [place] [id number]  Basic metabolic panel     Status: Abnormal    Collection Time: 01/07/23 12:28 AM  Result Value Ref Range    Sodium 134 (L) 135 - 145 mmol/L    Potassium 3.0 (L) 3.5 - 5.1 mmol/L    Chloride 95 (L) 98 - 111 mmol/L    CO2 29 22 - 32 mmol/L    Glucose, Bld 97 70 - 99 mg/dL      Comment: Glucose reference range applies only to samples taken after fasting for at least 8 hours.    BUN 23 8 - 23 mg/dL    Creatinine, Ser 1.19 0.61 - 1.24 mg/dL    Calcium 8.5 (L) 8.9 - 10.3 mg/dL    GFR, Estimated >60 >60 mL/min      Comment: (NOTE) Calculated using the CKD-EPI Creatinine Equation (2021)      Anion gap 10 5 - 15      Comment: Performed at [facility] [address], [place], [place] [address]  Glucose, capillary     Status: Abnormal    Collection Time:  01/07/23  4:13 AM  Result Value Ref Range    Glucose-Capillary 114 (H) 70 - 99 mg/dL      Comment: Glucose reference range applies  only to samples taken after fasting for at least 8 hours.    Comment 1 Notify RN      Comment 2 Document in Chart    Glucose, capillary     Status: None    Collection Time: 01/07/23  8:30 AM  Result Value Ref Range    Glucose-Capillary 81 70 - 99 mg/dL      Comment: Glucose reference range applies only to samples taken after fasting for at least 8 hours.  Comprehensive metabolic panel     Status: Abnormal    Collection Time: 01/08/23 12:54 AM  Result Value Ref Range    Sodium 133 (L) 135 - 145 mmol/L    Potassium 4.2 3.5 - 5.1 mmol/L    Chloride 99 98 - 111 mmol/L    CO2 26 22 - 32 mmol/L    Glucose, Bld 110 (H) 70 - 99 mg/dL      Comment: Glucose reference range applies only to samples taken after fasting for at least 8 hours.    BUN 21 8 - 23 mg/dL    Creatinine, Ser 1.19 0.61 - 1.24 mg/dL    Calcium 8.7 (L) 8.9 - 10.3 mg/dL    Total Protein 5.5 (L) 6.5 - 8.1 g/dL    Albumin 2.7 (L) 3.5 - 5.0 g/dL    AST 28 15 - 41 U/L    ALT 25 0 - 44 U/L    Alkaline Phosphatase 48 38 - 126 U/L    Total Bilirubin 0.8 0.3 - 1.2 mg/dL    GFR, Estimated >60 >60 mL/min      Comment: (NOTE) Calculated using the CKD-EPI Creatinine Equation (2021)      Anion gap 8 5 - 15      Comment: Performed at [facility], [place] [address] [place], [place] [address]  CBC     Status: Abnormal    Collection Time: 01/08/23 12:54 AM  Result Value Ref Range    WBC 15.3 (H) 4.0 - 10.5 K/uL    RBC 5.38 4.22 - 5.81 MIL/uL    Hemoglobin 14.1 13.0 - 17.0 g/dL    HCT 43.8 39.0 - 52.0 %    MCV 81.4 80.0 - 100.0 fL    MCH 26.2 26.0 - 34.0 pg    MCHC 32.2 30.0 - 36.0 g/dL    RDW 18.8 (H) 11.5 - 15.5 %    Platelets 243 150 - 400 K/uL    nRBC 0.0 0.0 - 0.2 %      Comment: Performed at [facility], [place] [address], [place], [place] [address]  Magnesium     Status: None     Collection Time: 01/08/23 12:54 AM  Result Value Ref Range    Magnesium 1.7 1.7 - 2.4 mg/dL      Comment: Performed at [facility] [address], [place], [place] [address]      Imaging Results (Last 48 hours)  No results found.         Blood pressure 134/78, pulse 76, temperature 98 F (36.7 C), temperature source Oral, resp. rate (!) 25, height 5\' 8"  (1.727 m), weight (!) 167.7 kg, SpO2 95 %.   Medical Problem List and Plan: 1. Functional deficits secondary to debility from CHF exacerbation             -patient may not shower until wound VAC removed             -ELOS/Goals: 12-16 days -supervision  to min A 2.  Antithrombotics: -DVT/anticoagulation:  Pharmaceutical: Heparin             -antiplatelet therapy: none   3. Pain Management: Tylenol, oxycodone 60 mg TID as needed- is home dose             -continue pregablin 100 mg daily- just restarted -see # 13   4. Mood/Behavior/Sleep: LCSW to evaluate and provide emotional support             -continue Cymbalta 60 mg BID             -antipsychotic agents: n/a   5. Neuropsych/cognition: This patient is capable of making decisions on his own behalf.   6. Skin/Wound Care: Routine skin care checks             -maintain wound VAC to midline incision; change M/W/F             -consult [place] RN   7. Fluids/Electrolytes/Nutrition: Strict Is and Os and follow-up chemistries             -continue Prosource, Boost, iron supplements   8: HFmidrangeEF:             -daily weight             -weight up>>resuming Lasix 80 mg daily on 1/16             -losartan 12.5 mg daily resumed 1/16   9: OSA on CPAP- uses home CPAP                10: Atrial fibrillation: persistent but alternating SR; s/p DCCV             -Eliquis held; general surgery to approve restarting- they said would determine 1/17?             -continue Pacerone 400 mg daily, metoprolol 100 mg daily   11: Class 3 obesity: BMI 56.21 1/16   12: Hypertension:  monitor TID and prn             -continue Lasix 80 mg daily             -continue losartan 12.5 mg daily   13: Peripheral neuropathy/lumbar spinal stenosis/chronic pain             -neurologist Dr. Metta Clines -oxycodone 60 mg TID, lorazepam 0.5 mg, pregabalin 100 mg at home per Dr. Gar Ponto   14: GERD/dyspepsia: Protonix 40 mg q HS             -give Tums before meals   15: BPH: increase Flomax to home dose>> 0.8 mg daily   16: COPD (previous tobacco use): continue Yupelri nebs daily- wheezed at rest with exam exertion- pt refuses Albuterol per chart.    17: Leukocytosis: afebrile; follow-up CBC   18: Loose stool: start probiotics   19. B/L Foot drop due to severe peripheral neuropathy- has AFO's but needs PRAFOs at bedtime- will order.    20. MASD- at groin and skin folds/between legs- con't Nystatin   I have personally performed a face to face diagnostic evaluation of this patient and formulated the key components of the plan.  Additionally, I have personally reviewed laboratory data, imaging studies, as well as relevant notes and concur with the physician assistant's documentation above.   The patient's status has not changed from the original H&P.  Any changes in documentation from the acute care chart have been noted above.  Barbie Banner, PA-C 01/08/2023

## 2023-01-08 NOTE — TOC Transition Note (Signed)
Transition of Care [facility]) - CM/SW Discharge Note   Patient Details  Name: Timothy Macias MRN: [id number] Date of Birth: 07/12/1961  [facility] CM/SW Contact:  Zenon Mayo, RN Phone Number: 01/08/2023, 12:18 PM   Clinical Narrative:    Patient is for dc to CIR , he will need a wound vac, NCM faxed order to [facility] with [employer].  She will work on getting this approved. Patient will be in CIR for at least 10 to 14 days and will go home with the wound vac at dc.  The CIR coordinators will set [place] up for patient prior to dc.    Final next level of care: IP Rehab Facility Barriers to Discharge: Continued Medical Work up   Patient Goals and CMS Choice CMS Medicare.gov Compare Post Acute Care list provided to:: Patient Represenative (must comment) (Wife) Choice offered to / list presented to : Spouse  Discharge Placement                         Discharge Plan and Services Additional resources added to the After Visit Summary for     Discharge Planning Services: CM Consult Post Acute Care Choice: Home Health          DME Arranged: NIV DME Agency: [employer] Date DME Agency Contacted: 01/08/23 Time DME Agency Contacted: 1218 Representative spoke with at DME Agency: Olivia Mackie            Social Determinants of Health ([place]) Interventions SDOH Screenings   Tobacco Use: Medium Risk (01/04/2023)     Readmission Risk Interventions     No data to display

## 2023-01-09 ENCOUNTER — Inpatient Hospital Stay (HOSPITAL_COMMUNITY): Payer: BC Managed Care – PPO

## 2023-01-09 DIAGNOSIS — R5381 Other malaise: Secondary | ICD-10-CM | POA: Diagnosis not present

## 2023-01-09 LAB — CBC WITH DIFFERENTIAL/PLATELET
Abs Immature Granulocytes: 0.7 10*3/uL — ABNORMAL HIGH (ref 0.00–0.07)
Basophils Absolute: 0 10*3/uL (ref 0.0–0.1)
Basophils Relative: 0 %
Eosinophils Absolute: 0.9 10*3/uL — ABNORMAL HIGH (ref 0.0–0.5)
Eosinophils Relative: 5 %
HCT: 47.1 % (ref 39.0–52.0)
Hemoglobin: 15.2 g/dL (ref 13.0–17.0)
Lymphocytes Relative: 4 %
Lymphs Abs: 0.7 10*3/uL (ref 0.7–4.0)
MCH: 26.5 pg (ref 26.0–34.0)
MCHC: 32.3 g/dL (ref 30.0–36.0)
MCV: 82.2 fL (ref 80.0–100.0)
Monocytes Absolute: 0.9 10*3/uL (ref 0.1–1.0)
Monocytes Relative: 5 %
Myelocytes: 4 %
Neutro Abs: 14.9 10*3/uL — ABNORMAL HIGH (ref 1.7–7.7)
Neutrophils Relative %: 82 %
Platelets: 332 10*3/uL (ref 150–400)
RBC: 5.73 MIL/uL (ref 4.22–5.81)
RDW: 19.5 % — ABNORMAL HIGH (ref 11.5–15.5)
WBC: 18.2 10*3/uL — ABNORMAL HIGH (ref 4.0–10.5)
nRBC: 0 % (ref 0.0–0.2)
nRBC: 0 /100 WBC

## 2023-01-09 LAB — COMPREHENSIVE METABOLIC PANEL
ALT: 36 U/L (ref 0–44)
AST: 36 U/L (ref 15–41)
Albumin: 3.1 g/dL — ABNORMAL LOW (ref 3.5–5.0)
Alkaline Phosphatase: 63 U/L (ref 38–126)
Anion gap: 10 (ref 5–15)
BUN: 21 mg/dL (ref 8–23)
CO2: 26 mmol/L (ref 22–32)
Calcium: 8.6 mg/dL — ABNORMAL LOW (ref 8.9–10.3)
Chloride: 96 mmol/L — ABNORMAL LOW (ref 98–111)
Creatinine, Ser: 1.26 mg/dL — ABNORMAL HIGH (ref 0.61–1.24)
GFR, Estimated: >60 mL/min (ref >60)
Glucose, Bld: 104 mg/dL — ABNORMAL HIGH (ref 70–99)
Potassium: 3.9 mmol/L (ref 3.5–5.1)
Sodium: 132 mmol/L — ABNORMAL LOW (ref 135–145)
Total Bilirubin: 1 mg/dL (ref 0.3–1.2)
Total Protein: 6.5 g/dL (ref 6.5–8.1)

## 2023-01-09 LAB — MAGNESIUM: Magnesium: 1.7 mg/dL (ref 1.7–2.4)

## 2023-01-09 LAB — VITAMIN D 25 HYDROXY (VIT D DEFICIENCY, FRACTURES): Vit D, 25-Hydroxy: 24.73 ng/mL — ABNORMAL LOW (ref 30–100)

## 2023-01-09 MED ORDER — IOHEXOL 350 MG/ML SOLN
75.0000 mL | Freq: Once | INTRAVENOUS | Status: AC | PRN
  Administered 2023-01-09: 75 mL via INTRAVENOUS

## 2023-01-09 MED ORDER — GERHARDT'S BUTT CREAM
TOPICAL_CREAM | Freq: Every day | CUTANEOUS | Status: DC
  Administered 2023-01-10: 1 via TOPICAL
  Filled 2023-01-09: qty 1

## 2023-01-09 NOTE — Patient Care Conference (Signed)
Inpatient RehabilitationTeam Conference and Plan of Care Update Date: 01/09/2023   Time: 11:39 AM    Patient Name: Timothy Macias      Medical Record Number: [id number]  Date of Birth: 1961-08-06 Sex: Male         Room/Bed: 4W21C/4W21C-01 Payor Info: Payor: [employer] / Plan: [employer] COMM PPO / Product Type: *No Product type* /    Admit Date/Time:  01/08/2023  1:09 PM  Primary Diagnosis:  [facility] Problems: Principal Problem:   Debility Active Problems:   Small bowel obstruction ([place])   Acute on chronic congestive heart failure [facility])    Expected Discharge Date: Expected Discharge Date: 01/23/23  Team Members Present: Physician leading conference: Dr. Leeroy Cha Social Worker Present: Erlene Quan, BSW Nurse Present: Dorien Chihuahua, RN PT Present: Becky Sax, PT OT Present: Jennefer Bravo, OT SLP Present: Weston Anna, SLP     Current Status/Progress Goal Weekly Team Focus  Bowel/Bladder   pt is continent of b/b. uses external cath due to not being able to "see" to use urinal.   remain continent, educate on proper usage of urinal independently or with assistance.   Assist with tolieting qshift and prn    Swallow/Nutrition/ Hydration               ADL's   Min A UB, Max A LB, Max A toileting   Supervision, min A   pain management, modifications/AE with ADLs, ADL retraining, general strengthening    Mobility   bed mobility mod A, transfers with bari RW min A   supervision  pain management, functional mobility/transfers, generalized strengthening and endurance, dynamic standing balance/coordination, and gait training.    Communication                Safety/Cognition/ Behavioral Observations               Pain   pt c/o chronic back and joint pain and stomach pain from reflux. Pain score >5, prn meds given   <3 pain scale   Assess pain qshift and prn    Skin   Pt has MASD to the bottom, groin, and breast area.  Healed/scabbed over place on R heel. mid incision on abdomen, wound vac in place,125 continuous, clean, dry, intact.   Promote healing and maintain skin from breakdown  Assess qshift and prn      Discharge Planning:  Assesment pending   Team Discussion: Patient with abdominal pain, loose stool post exp.lap for adhesions with wound vac. Abd. CT ordered per MD.  WOC following for wound care.  Off oxygen; tolerating RA. MASD to buttocks; Gerehardts cream ordered per MD.    Patient on target to meet rehab goals: yes, currently completes sit -stand with mod assist and transfers with min assist using a BA - RW. Needs mi assist for upper body care and max assist for lower body and toileting. Goals for discharge set for supervision overall.   *See Care Plan and progress notes for long and short-term goals.   Revisions to Treatment Plan:  N/a  Teaching Needs: Safety, medications, wound care/dressing changes, skin care, dietary modifications, etc.  Current Barriers to Discharge: Decreased caregiver support, Home enviroment access/layout, and Wound care  Possible Resolutions to Barriers: Family education     Medical Summary Current Status: uncontrolled pain, increasing luekocytosis, hyponatremia, morbid obesity, AKI, hypokalemia  Barriers to Discharge: Uncontrolled Pain;Medical stability;Infection/IV Antibiotics;Morbid Obesity;Renal Insufficiency/Failure;Electrolyte abnormality;Complicated Wound  Barriers to Discharge Comments: uncontrolled pain, increasing luekocytosis,  hyponatremia, morbid obesity, AKI, hypokalemia Possible Resolutions to [employer]: continue cymbalta and oxycodone, monitor CBC, wound vac replaced today, contiunue to monitor Na/WBC/K+/Cr, encourage drinking 6-8 glasses of water per day, CT abdomen today   Continued Need for Acute Rehabilitation Level of Care: The patient requires daily medical management by a physician with specialized training in physical  medicine and rehabilitation for the following reasons: Direction of a multidisciplinary physical rehabilitation program to maximize functional independence : Yes Medical management of patient stability for increased activity during participation in an intensive rehabilitation regime.: Yes Analysis of laboratory values and/or radiology reports with any subsequent need for medication adjustment and/or medical intervention. : Yes   I attest that I was present, lead the team conference, and concur with the assessment and plan of the team.   Dorien Chihuahua B 01/09/2023, 1:30 PM

## 2023-01-09 NOTE — Progress Notes (Signed)
Inpatient Rehabilitation  Patient information reviewed and entered into eRehab system by Kiran Lapine M. Dayvin Aber, M.A., CCC/SLP, PPS Coordinator.  Information including medical coding, functional ability and quality indicators will be reviewed and updated through discharge.    

## 2023-01-09 NOTE — Consult Note (Signed)
WOC Nurse Follow-up Consult Note: Reason for Consult: Change NPWT dressing to midline surgical wound Barkley Boards, PA-C with [facility] present at todays dressing change.   Wound type: surgical  Pressure Injury POA: NA Measurement: 19cm x 8cm x 9cm  Wound bed: 90% red moist, clean, 10% yellow fibrin Drainage (amount, consistency, odor) serosanguinous  Periwound: intact  Dressing procedure/placement/frequency: Filled wound with  __2_ piece of black foam Sealed NPWT dressing at 170mm HG  Patient received pain medication per bedside nurse prior to dressing change Patient tolerated procedure well.  [place] nurse will continue to provide NPWT dressing changed due to the complexity of the dressing change.   Thank you,    Jiovani Mccammon MSN, RN-BC, [employer]

## 2023-01-09 NOTE — Plan of Care (Signed)
  Problem: RH Balance Goal: LTG Patient will maintain dynamic standing with ADLs (OT) Description: LTG:  Patient will maintain dynamic standing balance with assist during activities of daily living (OT)  Flowsheets (Taken 01/09/2023 1231) LTG: Pt will maintain dynamic standing balance during ADLs with: Supervision/Verbal cueing   Problem: Sit to Stand Goal: LTG:  Patient will perform sit to stand in prep for activites of daily living with assistance level (OT) Description: LTG:  Patient will perform sit to stand in prep for activites of daily living with assistance level (OT) Flowsheets (Taken 01/09/2023 1231) LTG: PT will perform sit to stand in prep for activites of daily living with assistance level: Supervision/Verbal cueing   Problem: RH Bathing Goal: LTG Patient will bathe all body parts with assist levels (OT) Description: LTG: Patient will bathe all body parts with assist levels (OT) Flowsheets (Taken 01/09/2023 1231) LTG: Pt will perform bathing with assistance level/cueing: Supervision/Verbal cueing   Problem: RH Dressing Goal: LTG Patient will perform upper body dressing (OT) Description: LTG Patient will perform upper body dressing with assist, with/without cues (OT). Flowsheets (Taken 01/09/2023 1231) LTG: Pt will perform upper body dressing with assistance level of: Independent with assistive device Goal: LTG Patient will perform lower body dressing w/assist (OT) Description: LTG: Patient will perform lower body dressing with assist, with/without cues in positioning using equipment (OT) Flowsheets (Taken 01/09/2023 1231) LTG: Pt will perform lower body dressing with assistance level of: Supervision/Verbal cueing   Problem: RH Toileting Goal: LTG Patient will perform toileting task (3/3 steps) with assistance level (OT) Description: LTG: Patient will perform toileting task (3/3 steps) with assistance level (OT)  Flowsheets (Taken 01/09/2023 1231) LTG: Pt will perform toileting  task (3/3 steps) with assistance level: Supervision/Verbal cueing   Problem: RH Functional Use of Upper Extremity Goal: LTG Patient will use RT/LT upper extremity as a (OT) Description: LTG: Patient will use right/left upper extremity as a stabilizer/gross assist/diminished/nondominant/dominant level with assist, with/without cues during functional activity (OT) Flowsheets (Taken 01/09/2023 1231) LTG: Use of upper extremity in functional activities: LUE as nondominant level LTG: Pt will use upper extremity in functional activity with assistance level of: Supervision/Verbal cueing   Problem: RH Simple Meal Prep Goal: LTG Patient will perform simple meal prep w/assist (OT) Description: LTG: Patient will perform simple meal prep with assistance, with/without cues (OT). Flowsheets (Taken 01/09/2023 1231) LTG: Pt will perform simple meal prep with assistance level of: Independent with assistive device LTG: Pt will perform simple meal prep w/level of: Wheelchair level   Problem: RH Toilet Transfers Goal: LTG Patient will perform toilet transfers w/assist (OT) Description: LTG: Patient will perform toilet transfers with assist, with/without cues using equipment (OT) Flowsheets (Taken 01/09/2023 1231) LTG: Pt will perform toilet transfers with assistance level of: Supervision/Verbal cueing

## 2023-01-09 NOTE — Progress Notes (Addendum)
Patient alert oriented, transferred via stretcher to MRI, No c/ol pain   2010 Pin returned from MRI, no c/o

## 2023-01-09 NOTE — Progress Notes (Signed)
Patient ID: Timothy Macias, male   DOB: August 16, 1961, 62 y.o.   MRN: [id number]  Team Conference Report to Patient/Family  Team Conference discussion was reviewed with the patient and caregiver, including goals, any changes in plan of care and target discharge date.  Patient and caregiver express understanding and are in agreement.  The patient has a target discharge date of 01/23/23.  Sw met with patient, introduced self and explained role. Sw provided team conference updates. Patients spouse will be present today. Plan is for patient to d/c home on wound Vac. No additional questions or concerns currently. Sw will FU to complete assessment.   [name] 01/09/2023, 2:12 PM

## 2023-01-09 NOTE — Evaluation (Signed)
Occupational Therapy Assessment and Plan  Patient Details  Name: Timothy Macias MRN: [id number] Date of Birth: 31-Dec-1960  OT Diagnosis: acute pain, muscle weakness (generalized), swelling of limb, and impaired sensation of bilateral hands and LEs Rehab Potential: Rehab Potential (ACUTE ONLY): Good ELOS: 2 weeks   Today's Date: 01/09/2023 OT Individual Time: 1005-1100 & 1300-1345 OT Individual Time Calculation (min): 55 min  & 45 min OT missed time: 30 min Missed time reason: fatigue   Hospital Problem: Principal Problem:   Debility Active Problems:   Small bowel obstruction ([place])   Acute on chronic congestive heart failure ([place])   Past Medical History:  Past Medical History:  Diagnosis Date   Atrial fibrillation ([place])    CHF (congestive heart failure) ([place])    Fatty liver    Hypertension    Sleep apnea    wears CPAP   Past Surgical History:  Past Surgical History:  Procedure Laterality Date   APPLICATION OF WOUND VAC N/A 01/02/2023   Procedure: APPLICATION OF WOUND VAC;  Surgeon: Rolm Bookbinder, MD;  Location: [place];  Service: General;  Laterality: N/A;   CARDIOVERSION N/A 01/01/2023   Procedure: CARDIOVERSION;  Surgeon: Skeet Latch, MD;  Location: [place];  Service: Cardiovascular;  Laterality: N/A;   HIP ARTHROPLASTY Right    KNEE ARTHROPLASTY Right    LAPAROTOMY N/A 01/02/2023   Procedure: EXPLORATORY LAPAROTOMY WITH LYSIS OF ADHESIONS WITH SMALL BOWEL RESECTION;  Surgeon: Rolm Bookbinder, MD;  Location: [place];  Service: General;  Laterality: N/A;   TONSILLECTOMY     WISDOM TOOTH EXTRACTION      Assessment & Plan Clinical Impression:  Timothy Macias is a 56 year R handed old male who presented to his PCP on 12/29/2023 complaining of SOB and rapid weight gain with history of HFrEF and atrial fibrillation on Eliquis. Transported via EMS to [facility] ED. Found to be in rapid atrial fibrillation admitted to hospitalist service. Mild hypoxia  supplemented with 3L O2 via [place]. Amiodarone infusion started. Labs consistent with AKI, elevated hemoglobin. Episode of emesis with nausea and lack of appetite. Transferred to [facility]. Lethargic and RR called on 1/06. A fib given dig, Lasix and BB. Cardiology consulted. HFrEF 45% secondary to NICM. Notes indicated alcohol intake is 6 beers daily for ~45 years.   Noted abdominal distention and wife stated the patient had undergone incarcerated open umbilical hernia repair at [facility] in 2023. KUB with dilated SB loops and general surgery consulted. NGT place with 3 liters of output. Eliquis held and CT abdomen and pelvis obtained. RR called 1/07 due to decreased LOC. Transferred to ICU and PCCM consulted. Eliquis held and heparin infusion started. Required cardioversion 1/09. No improvement in SBO with small bowel protocol and underwent exploratory laparotomy with lysis of adhesions and small bowel resection by Dr. Donne Hazel on 1/10. Fascia was closed and wound VAC placed. Remained on vent post-op. Skin edge bleeding vessel required suturing at bedside on 1/12. Heparin infusion stopped and heparin [place] started for DVT prophylaxis. NGT removed and given ice chips on 1/12. Diet advanced to soft. Restarted Lasix 80 mg and losartan. VAC dressing to be changed every M/W/F. To restart Eliquis when approved by surgical team. The patient requires inpatient physical medicine and rehabilitation evaluations and treatment secondary to dysfunction due to exploratory laparotomy, SBO, heart failure. Patient transferred to CIR on 01/08/2023 .    Patient currently requires max with basic self-care skills and IADL secondary to muscle weakness, decreased cardiorespiratoy endurance, decreased coordination, and  decreased standing balance.  Prior to hospitalization, patient could complete all self-care with Supervision to min A.  Patient will benefit from skilled intervention to decrease level of assist with basic self-care skills, increase  independence with basic self-care skills, and increase level of independence with iADL prior to discharge home with care partner.  Anticipate patient will require 24 hour supervision and minimal physical assistance and follow up home health.  OT - End of Session Activity Tolerance: Tolerates 10 - 20 min activity with multiple rests Endurance Deficit: Yes Endurance Deficit Description: required rest breaks in between transfers and toileting OT Assessment Rehab Potential (ACUTE ONLY): Good OT Barriers to Discharge: Incontinence;Wound Care;Lack of/limited family support;Weight OT Patient demonstrates impairments in the following area(s): Balance;Edema;Endurance;Motor;Pain;Sensory OT Basic ADL's Functional Problem(s): Bathing;Dressing;Toileting OT Advanced ADL's Functional Problem(s): Simple Meal Preparation OT Transfers Functional Problem(s): Toilet;Tub/Shower OT Additional Impairment(s): Fuctional Use of Upper Extremity (LUE) OT Plan OT Intensity: Minimum of 1-2 x/day, 45 to 90 minutes OT Frequency: 5 out of 7 days OT Duration/Estimated Length of Stay: 2 weeks OT Treatment/Interventions: Balance/vestibular training;Discharge planning;Pain management;Self Care/advanced ADL retraining;Therapeutic Activities;UE/LE Coordination activities;Disease mangement/prevention;Functional mobility training;Patient/family education;Skin care/wound managment;Therapeutic Exercise;DME/adaptive equipment instruction;Psychosocial support;Neuromuscular re-education;UE/LE Strength taining/ROM;Wheelchair propulsion/positioning OT Self Feeding Anticipated Outcome(s): Mod I OT Basic Self-Care Anticipated Outcome(s): Supervision, min A OT Toileting Anticipated Outcome(s): Supervision OT Bathroom Transfers Anticipated Outcome(s): Supervision OT Recommendation Recommendations for Other Services: Therapeutic Recreation consult;Neuropsych consult Therapeutic Recreation Interventions: Stress management Patient destination:  Home Follow Up Recommendations: Home health OT;24 hour supervision/assistance Equipment Recommended: To be determined   OT Evaluation Precautions/Restrictions  Precautions Precautions: Fall;Other (comment) Precaution Comments: abdominal wound, bilateral AFOs Restrictions Weight Bearing Restrictions: No Home Living/Prior Functioning Home Living Family/patient expects to be discharged to:: Private residence Living Arrangements: Spouse/significant other Available Help at Discharge: Family, Available 24 hours/day (wife works day shift full time but FIL is available PRN) Type of Home: House Home Access: Ramped entrance Home Layout: One level Bathroom Shower/Tub: Walk-in shower, Curtain (1 inch step to enter, has grab bars and TTB) Bathroom Toilet: Handicapped height (grab bars on both sides) Bathroom Accessibility: Yes Additional Comments: has hoyer lift, easy stand, bari RW, WC, bedside commode, bilateral AFO's that he has been wearing for 3-4 years due to neuropathy  Lives With: Spouse (wife is a Marine scientist) IADL History Homemaking Responsibilities: Yes Meal Prep Responsibility: Primary Current License: Yes Mode of Transportation: Car Occupation: On disability 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 Leisure and Hobbies: Reading, working on cars IADL Comments: did meal prep at [facility] level Prior Function Level of Independence: [place] for independence, Independent with homemaking with wheelchair, Needs assistance with ADLs Bath: Minimal Dressing: Minimal  Able to Take Stairs?: No Driving: Yes Vocation: On disability Vision Baseline Vision/History: 1 Wears glasses Ability to See in Adequate Light: 1 Impaired Patient Visual Report: Diplopia Vision Assessment?: Yes Eye Alignment: Within Functional Limits Ocular Range of Motion: Within Functional Limits Alignment/Gaze Preference: Within Defined Limits Tracking/Visual Pursuits: Decreased smoothness of  horizontal tracking Saccades: Within functional limits Convergence: Within functional limits Visual Fields: No apparent deficits Diplopia Assessment: Disappears with one eye closed;Objects split side to side Perception  Perception: Within Functional Limits Praxis Praxis: Intact Cognition Cognition Overall Cognitive Status: Within Functional Limits for tasks assessed Arousal/Alertness: Awake/alert Orientation Level: Person;Place;Situation Person: Oriented Place: Oriented Situation: Oriented Memory: Appears intact Awareness: Appears intact Problem Solving: Appears intact Safety/Judgment: Appears intact Brief Interview for Mental Status (BIMS) Repetition of Three Words (First Attempt): 3 Temporal Orientation:  Year: Correct Temporal Orientation: Month: Accurate within 5 days Temporal Orientation: Day: Correct Recall: "Sock": Yes, no cue required Recall: "Blue": Yes, no cue required Recall: "Bed": No, could not recall BIMS Summary Score: 13 Sensation Sensation Light Touch: Impaired Detail Proprioception: Impaired Detail Proprioception Impaired Details: Impaired RLE;Impaired LLE Additional Comments: decreased sensation along L L3 dermatome >R; then absent sensation from L3 dermatone and below bilaterally Coordination Gross Motor Movements are Fluid and Coordinated: No Fine Motor Movements are Fluid and Coordinated: No Finger Nose Finger Test: slow and mild dysmetria on LUE Heel Shin Test: unable to perform bilaterally Motor  Motor Motor: Other (comment) Motor - Skilled Clinical Observations: grossly uncoordinated due to neuropathy, decreased balance/coordination, generalized weakness, and poor endurance  Trunk/Postural Assessment  Cervical Assessment Cervical Assessment: Exceptions to [facility] (forward head) Thoracic Assessment Thoracic Assessment: Exceptions to [facility] (thoracic rounding) Lumbar Assessment Lumbar Assessment: Exceptions to [facility] (anterior pelvic tilt) Postural  Control Postural Control: Deficits on evaluation Protective Responses: altered due to neuropathy  Balance Balance Balance Assessed: Yes Static Sitting Balance Static Sitting - Balance Support: Feet supported;Bilateral upper extremity supported Static Sitting - Level of Assistance: 5: Stand by assistance (supervision) Dynamic Sitting Balance Dynamic Sitting - Balance Support: Feet supported;No upper extremity supported Dynamic Sitting - Level of Assistance: 5: Stand by assistance (supervision) Static Standing Balance Static Standing - Balance Support: Bilateral upper extremity supported;During functional activity (bari RW) Static Standing - Level of Assistance: 4: Min assist Dynamic Standing Balance Dynamic Standing - Balance Support: Bilateral upper extremity supported;During functional activity (bari RW) Dynamic Standing - Level of Assistance: 4: Min assist Dynamic Standing - Comments: with transfers Extremity/Trunk Assessment RUE Assessment RUE Assessment: Exceptions to [facility] Active Range of Motion (AROM) Comments: WFL General Strength Comments: 4-/5 shoulder flexion LUE Assessment LUE Assessment: Exceptions to [facility] Active Range of Motion (AROM) Comments: limited to 90 degrees shoulder flexion (reports this being a result of the surgery as this is different than baseline and right before surgery); [facility] distally General Strength Comments: 3-/5 shoulder flexion, [facility] distally  Care Tool Care Tool Self Care Eating   Eating Assist Level: Set up assist    Oral Care    Oral Care Assist Level: Set up assist    Bathing   Body parts bathed by patient: Right arm;Left arm;Chest;Abdomen;Face Body parts bathed by helper: Front perineal area;Buttocks;Right lower leg;Left lower leg   Assist Level: Maximal Assistance - Patient 24 - 49%    Upper Body Dressing(including orthotics)   What is the patient wearing?: Pull over shirt   Assist Level: Minimal Assistance - Patient > 75%    Lower  Body Dressing (excluding footwear)   What is the patient wearing?: Underwear/pull up Assist for lower body dressing: Maximal Assistance - Patient 25 - 49%    Putting on/Taking off footwear   What is the patient wearing?: Socks;Shoes;AFO Assist for footwear: Dependent - Patient 0%       Care Tool Toileting Toileting activity   Assist for toileting: Maximal Assistance - Patient 25 - 49%     Care Tool Bed Mobility Roll left and right activity   Roll left and right assist level: Moderate Assistance - Patient 50 - 74%    Sit to lying activity        Lying to sitting on side of bed activity   Lying to sitting on side of bed assist level: the ability to move from lying on the back to sitting on the side of the bed with no  back support.: Moderate Assistance - Patient 50 - 74%     Care Tool Transfers Sit to stand transfer   Sit to stand assist level: Moderate Assistance - Patient 50 - 74%    Chair/bed transfer   Chair/bed transfer assist level: Minimal Assistance - Patient > 75%     Toilet transfer   Assist Level: Minimal Assistance - Patient > 75% (stand pivot)     Care Tool Cognition  Expression of Ideas and Wants Expression of Ideas and Wants: 4. Without difficulty (complex and basic) - expresses complex messages without difficulty and with speech that is clear and easy to understand  Understanding Verbal and Non-Verbal Content Understanding Verbal and Non-Verbal Content: 4. Understands (complex and basic) - clear comprehension without cues or repetitions   Memory/Recall Ability Memory/Recall Ability : Current season;That he or she is in a hospital/hospital unit;Location of own room;Staff names and faces   Refer to Care Plan for Long Term Goals  SHORT TERM GOAL WEEK 1 OT Short Term Goal 1 (Week 1): Pt will complete sit > stand in prep for ADL with Min A using LRAD OT Short Term Goal 2 (Week 1): Pt will complete toileting with min A using LRAD OT Short Term Goal 3 (Week 1):  Pt will complete toilet transfer with CGA using LRAD  Recommendations for other services: Neuropsych and Therapeutic Recreation  Stress management   Skilled Therapeutic Intervention Session 1 Patient received seated in w/c upon therapy arrival and agreeable to participate in OT evaluation. Education provided on OT purpose, therapy schedule, goals for therapy, and safety policy while in rehab. Un-rated pain in abdomen; pre-medicated, with OT offering rest breaks and repositioning for pain reduction throughout. Patient demonstrates deficits with powering up in stance from low surfaces, general strengthening, balance, endurance and sensory (bilateral hands and BLE) deficits resulting in difficulty completing BADL tasks without increased physical assist. Pt will benefit from skilled OT services to focus on mentioned deficits. See below for ADL and functional transfer performance. MD notified of LUE ROM/strength impairment and associated pain with request for imaging to determine potential RTC injury from surgery as pt did not have this lack of motion/pain at baseline. Also notified nursing of NPO status on chart but no NPO sign on door and pt unaware of his status in prep for upcoming meal. Pt remained seated in w/c at conclusion of session with chair pad alarm on and all needs met at end of session.  Session 2 Skilled OT intervention completed with focus on toileting education/problem solving and bed level exercises. Pt received semi-supine in bed with family present visiting including pt's wife. Pt stated "I am not getting OOB, I am tired and I am not putting on all that stuff and getting up if they're coming to get me for the CT and I've had enough today anyway." Pt declined sitting EOB but was agreeable to bed level exercises therefore missed a total of 30 mins of OT intervention due to fatigue/refusal; will attempt to make up missed time as able. No formal pain reported during session.  Completed the  following on RUE only for strengthening needed for powering up into stance and for shoulder integrity with RW use for transfers: (With red theraband anchored on bed rail) x10 reps  Horizontal abduction/adduction Shoulder flexion Bicep flexion Chest presses Shoulder external/internal rotation Shoulder extension -Did not complete on LUE due to IV nurse present for attempt at placing IV and due to LUE pain and pending MRI  Nursing  in room to notify pt of pure wick removal per rehab plans and policies. Pt got very upset with this news, with OT providing extensive education on purpose of OOB toileting and toileting without pure wick in prep for home however pt remained frustrated stating "I'm not gonna argue with you. It's stupid, point blank." OT offered to consult MD about leaving the pure wick order for night time only to allow pt time to transition and gain strength/endurance prior to removal at night. 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 Nursing made aware of toileting options discussed and requirement to have bilateral AFOs prior to standing if doing stance toileting.   Pt remained semi-supine, with bed alarm on, wife and father present and with all needs in reach at end of session.  ADL ADL Eating: Set up Where Assessed-Eating: Wheelchair Grooming: Setup Where Assessed-Grooming:  Wheelchair Upper Body Bathing: Supervision/safety (simulated) Where Assessed-Upper Body Bathing: Wheelchair Lower Body Bathing: Maximal assistance (simulated) Where Assessed-Lower Body Bathing: Sitting at sink;Standing at sink Upper Body Dressing: Minimal assistance Where Assessed-Upper Body Dressing: Wheelchair Lower Body Dressing: Maximal assistance Where Assessed-Lower Body Dressing: Sitting at sink;Standing at sink Toileting: Maximal assistance Where Assessed-Toileting: Bedside Commode Toilet Transfer: Minimal assistance Toilet Transfer Method: Stand pivot Acupuncturist: Extra wide bedside commode Tub/Shower Transfer: Unable to assess Tub/Shower Transfer Method: Unable to assess Film/video editor: Unable to assess Film/video editor Method: Unable to assess Mobility  Bed Mobility Bed Mobility: Rolling Right;Supine to Sit Rolling Right: Moderate Assistance - Patient 50-74% Supine to Sit: Moderate Assistance - Patient 50-74% Transfers Sit to Stand: Moderate Assistance - Patient 50-74% Stand to Sit: Contact Guard/Touching assist   Discharge Criteria: Patient will be discharged from OT if patient refuses treatment 3 consecutive times without medical reason, if treatment goals not met, if there is a change in medical status, if patient makes no progress towards goals or if patient is discharged from hospital.  The above assessment, treatment plan, treatment alternatives and goals were discussed and mutually agreed upon: by patient  Melvyn Novas, MS, OTR/L  01/09/2023, 3:26 PM

## 2023-01-09 NOTE — Progress Notes (Signed)
   Progress Note     Subjective: Pt reports he tolerated soft diet and having bowel function. He reported a little more pain overnight and some brief nausea but no vomiting. VAC replaced at bedside this AM with WOC RN.    Objective: Vital signs in last 24 hours: Temp:  [97.8 F (36.6 C)-98.6 F (37 C)] 98 F (36.7 C) (01/17 0438) Pulse Rate:  [76-79] 76 (01/17 0438) Resp:  [18-19] 19 (01/17 0438) BP: (98-124)/(65-72) 98/65 (01/17 0438) SpO2:  [92 %-98 %] 96 % (01/17 0438) Weight:  [160.4 kg-165.3 kg] 165.3 kg (01/17 0438) Last BM Date : 01/08/23  Intake/Output from previous day: 01/16 0701 - 01/17 0700 In: 240 [P.O.:240] Out: 600 [Urine:600] Intake/Output this shift: No intake/output data recorded.  PE: General: pleasant, WD, obese male who is laying in bed in NAD Heart: regular, rate, and rhythm.  Lungs: Respiratory effort nonlabored Abd: soft, appropriately ttp, midline wound without dehiscence and no signs of wound infection or significant bleeding  Psych: A&Ox3 with an appropriate affect.    Lab Results:  Recent Labs    01/08/23 0054 01/09/23 0846  WBC 15.3* 18.2*  HGB 14.1 15.2  HCT 43.8 47.1  PLT 243 332    BMET Recent Labs    01/08/23 0054 01/09/23 0846  NA 133* 132*  K 4.2 3.9  CL 99 96*  CO2 26 26  GLUCOSE 110* 104*  BUN 21 21  CREATININE 1.19 1.26*  CALCIUM 8.7* 8.6*    PT/INR No results for input(s): "LABPROT", "INR" in the last 72 hours. CMP     Component Value Date/Time   NA 132 (L) 01/09/2023 0846   K 3.9 01/09/2023 0846   CL 96 (L) 01/09/2023 0846   CO2 26 01/09/2023 0846   GLUCOSE 104 (H) 01/09/2023 0846   BUN 21 01/09/2023 0846   CREATININE 1.26 (H) 01/09/2023 0846   CALCIUM 8.6 (L) 01/09/2023 0846   PROT 6.5 01/09/2023 0846   ALBUMIN 3.1 (L) 01/09/2023 0846   AST 36 01/09/2023 0846   ALT 36 01/09/2023 0846   ALKPHOS 63 01/09/2023 0846   BILITOT 1.0 01/09/2023 0846   GFRNONAA >60 01/09/2023 0846   Lipase  No results  found for: "LIPASE"     Studies/Results: No results found.  Anti-infectives: Anti-infectives (From admission, onward)    None        Assessment/Plan  POD7, s/p ex lap with LOA and SBR for SBO, Dr. Donne Hazel 1/10 - tolerating soft diet  - continue VAC and change M/W/F - mobilize with nursing and PT - IS, pulm toilet - continue chronic home pain meds  - WBC up to 18 this AM, afebrile and clinically looks well - recommend CT AP today    FEN - NPO  VTE - SQH, hold eliquis until we get scan and can eval ID - completed 24 hrs post op abx   - below per TRH -  Afib T. CHF HTN OSA NASH Acute on CKD  Morbid obesity   LOS: 1 day    Norm Parcel, [facility] 01/09/2023, 10:35 AM Please see Amion for pager number during day hours 7:00am-4:30pm

## 2023-01-09 NOTE — Progress Notes (Signed)
Pt placed on home CPAP, resting comfortably at this time. 

## 2023-01-09 NOTE — Progress Notes (Signed)
PROGRESS NOTE   Subjective/Complaints: Surgery is replacing wound vac this morning Had more pain overnight Has some nausea CBC elevated, CT abdomen ordered  ROS: +abdominal pain  Objective:   No results found. Recent Labs    01/08/23 0054 01/09/23 0846  WBC 15.3* 18.2*  HGB 14.1 15.2  HCT 43.8 47.1  PLT 243 332   Recent Labs    01/08/23 0054 01/09/23 0846  NA 133* 132*  K 4.2 3.9  CL 99 96*  CO2 26 26  GLUCOSE 110* 104*  BUN 21 21  CREATININE 1.19 1.26*  CALCIUM 8.7* 8.6*    Intake/Output Summary (Last 24 hours) at 01/09/2023 1133 Last data filed at 01/08/2023 2354 Gross per 24 hour  Intake 240 ml  Output 600 ml  Net -360 ml        Physical Exam: Vital Signs Blood pressure 98/65, pulse 84, temperature 98 F (36.7 C), temperature source Oral, resp. rate 18, height 5\' 8"  (1.727 m), weight (!) 165.3 kg, SpO2 93 %. Gen: no distress, normal appearing HEENT: oral mucosa pink and moist, NCAT Cardio: Reg rate  Show:Clear all [x] Written[] Templated[x] Copied  Added by: [x] Lovorn, Megan, MD  [] Hover for details      Physical Medicine and Rehabilitation Admission H&P     CC: Debility secondary to SBO s/p laparotomy, HFrEF   HPI: Timothy Macias is a 32 year R handed old male who presented to his PCP on 12/29/2023 complaining of SOB and rapid weight gain with history of HFrEF and atrial fibrillation on Eliquis. Transported via EMS to [facility] ED. Found to be in rapid atrial fibrillation admitted to hospitalist service. Mild hypoxia supplemented with 3L O2 via [place]. Amiodarone infusion started. Labs consistent with AKI, elevated hemoglobin. Episode of emesis with nausea and lack of appetite. Transferred to [facility]. Lethargic and RR called on 1/06. A fib given dig, Lasix and BB. Cardiology consulted. HFrEF 45% secondary to NICM. Notes indicated alcohol intake is 6 beers daily for ~45 years.   Noted abdominal  distention and wife stated the patient had undergone incarcerated open umbilical hernia repair at [facility] in 2023. KUB with dilated SB loops and general surgery consulted. NGT place with 3 liters of output. Eliquis held and CT abdomen and pelvis obtained. RR called 1/07 due to decreased LOC. Transferred to ICU and PCCM consulted. Eliquis held and heparin infusion started. Required cardioversion 1/09. No improvement in SBO with small bowel protocol and underwent exploratory laparotomy with lysis of adhesions and small bowel resection by [facility]. [facility] on 1/10. Fascia was closed and wound VAC placed. Remained on vent post-op. Skin edge bleeding vessel required suturing at bedside on 1/12. Heparin infusion stopped and heparin [place] started for DVT prophylaxis. NGT removed and given ice chips on 1/12. Diet advanced to soft. Restarted Lasix 80 mg and losartan. VAC dressing to be changed every M/W/F. To restart Eliquis when approved by surgical team. The patient requires inpatient physical medicine and rehabilitation evaluations and treatment secondary to dysfunction due to exploratory laparotomy, SBO, heart failure.   Primary complaint is dyspepsia. Takes Protonix every evening.  Loose stools and requesting probiotics. Dose of Flomax at home is 0.8 mg daily.  Does not use any maintenance inhalers at home. Albuterol and Breztri cause unpleasant feeling.      PMH includes bilateral nephrolithiasis and bilateral renal cysts, depression, reflux esophagitis, peripheral neuropathy, lumbar spinal stenosis, OSA, arthritis, hypertension. Diagnosed with atrial fibrillation in 2016 and underwent ablation at [facility] in 2020. Diagnosed with HF in 2020 and follows with Dr. Jodelle Gross Assar. On chronic opioids, benzodiazepines per PCP, Dr. Gar Ponto.   Pt reports pain adequately controlled;  Pt reports has been on Oxycodone 60 mg TID for 2-3 years, but taking Oxy 30 mg tabs for 2-3 months- for chronic back and joint pain.  Has his CPAP  from home to use.  Has B/L carbon AFOs from home- chronic for B/L severe peripheral neuropathy causing B/L Foot drop.    Notes hasn't been able to "find his penis for 3+ years" and pees standing over a bucket at home prior- so doesn't know how can use Urinal here.      Review of Systems  Constitutional:  Negative for chills and fever.  HENT:  Negative for congestion and sore throat.   Eyes: Negative.  Negative for photophobia.  Respiratory:  Negative for cough and shortness of breath.   Cardiovascular:  Negative for chest pain and palpitations.  Gastrointestinal:  Positive for heartburn. Negative for constipation, nausea and vomiting.       Loose stools  Genitourinary:  Negative for dysuria, hematuria and urgency.  Musculoskeletal:  Positive for joint pain and myalgias. Negative for falls.  Skin: Negative.   Neurological:  Negative for dizziness and headaches.  Endo/Heme/Allergies: Negative.   Psychiatric/Behavioral:  Negative for depression, hallucinations and suicidal ideas. The patient has insomnia.   All other systems reviewed and are negative.          Past Medical History:  Diagnosis Date   Atrial fibrillation ([place])     CHF (congestive heart failure) ([place])     Fatty liver     Hypertension     Sleep apnea      wears CPAP             Past Surgical History:  Procedure Laterality Date   APPLICATION OF WOUND VAC N/A 01/02/2023    Procedure: APPLICATION OF WOUND VAC;  Surgeon: Rolm Bookbinder, MD;  Location: [place];  Service: General;  Laterality: N/A;   CARDIOVERSION N/A 01/01/2023    Procedure: CARDIOVERSION;  Surgeon: Skeet Latch, MD;  Location: [place];  Service: Cardiovascular;  Laterality: N/A;   HIP ARTHROPLASTY Right     KNEE ARTHROPLASTY Right     LAPAROTOMY N/A 01/02/2023    Procedure: EXPLORATORY LAPAROTOMY WITH LYSIS OF ADHESIONS WITH SMALL BOWEL RESECTION;  Surgeon: Rolm Bookbinder, MD;  Location: [place];  Service: General;  Laterality: N/A;    TONSILLECTOMY       WISDOM TOOTH EXTRACTION                 Family History  Problem Relation Age of Onset   Bone cancer Mother     Other Father          MVA    Social History:  reports that he has quit smoking. His smoking use included cigarettes. He smoked an average of 1 pack per day. He has never used smokeless tobacco. He reports that he does not currently use alcohol. He reports that he does not use drugs. Allergies:         Allergies  Allergen Reactions   Albuterol Swelling   Amiodarone Rash  Penicillins Hives               Medications Prior to Admission  Medication Sig Dispense Refill   acetaminophen (TYLENOL) 500 MG tablet Take 1,000 mg by mouth every 8 (eight) hours as needed for mild pain.       amiodarone (PACERONE) 200 MG tablet Take 400 mg by mouth daily.       apixaban (ELIQUIS) 5 MG TABS tablet Take 5 mg by mouth daily.       cyclobenzaprine (FLEXERIL) 10 MG tablet Take 10 mg by mouth 3 (three) times daily as needed for muscle spasms.       diphenhydramine-acetaminophen (TYLENOL PM) 25-500 MG TABS tablet Take 2 tablets by mouth at bedtime as needed.       DULoxetine (CYMBALTA) 60 MG capsule Take 60 mg by mouth 2 (two) times daily.       ferrous sulfate 325 (65 FE) MG EC tablet Take 325 mg by mouth daily with breakfast.       folic acid (FOLVITE) 1 MG tablet Take 1 mg by mouth daily.       furosemide (LASIX) 80 MG tablet Take 80 mg by mouth daily.       LORazepam (ATIVAN) 0.5 MG tablet Take 0.5 mg by mouth every 8 (eight) hours as needed for anxiety.       losartan (COZAAR) 25 MG tablet Take 12.5 mg by mouth daily.       Magnesium 400 MG TABS Take 400 mg by mouth daily.       melatonin 5 MG TABS Take 5-10 mg by mouth at bedtime as needed (sleep).       metoprolol succinate (TOPROL-XL) 25 MG 24 hr tablet Take 75 mg by mouth daily.       Multiple Vitamins-Minerals (YOUR LIFE MULTI ADULT GUMMIES) CHEW Chew by mouth.       oxycodone (ROXICODONE) 30 MG immediate release  tablet Take 60 mg by mouth 3 (three) times daily as needed (pain).       pantoprazole (PROTONIX) 40 MG tablet Take 1 tablet by mouth 2 (two) times daily.       potassium chloride SA (K-DUR) 20 MEQ tablet Take 40 mEq by mouth daily.       pregabalin (LYRICA) 100 MG capsule Take 100 mg by mouth at bedtime.       promethazine (PHENERGAN) 25 MG tablet Take 25 mg by mouth every 6 (six) hours as needed for nausea or vomiting.       SUMAtriptan (IMITREX) 100 MG tablet Take 100 mg by mouth every 2 (two) hours as needed for migraine.       tamsulosin (FLOMAX) 0.4 MG CAPS capsule Take 0.8 mg by mouth daily.       testosterone cypionate (DEPOTESTOSTERONE CYPIONATE) 200 MG/ML injection Inject 200 mg into the muscle once a week.              Home: Home Living Family/patient expects to be discharged to:: Private residence Living Arrangements: Spouse/significant other Available Help at Discharge: Family, Available PRN/intermittently Type of Home: House Home Access: Ramped entrance Home Layout: One level Bathroom Shower/Tub: Health visitor: Handicapped height Home Equipment: Information systems manager - built in, [employer] - tub/shower, Other (comment), Wheelchair - manual, Cane - single point, BSC/3in1, Agricultural consultant (2 wheels), Rollator (4 wheels)   Functional History: Prior Function Prior Level of Function : Needs assist Mobility Comments: walks with bari RW, wife assists with bed mobility, uses  WC outside of house ADLs Comments: wife assists with bathing and LB dressing   Functional Status:  Mobility: Bed Mobility Overal bed mobility: Needs Assistance Bed Mobility: Sit to Sidelying, Rolling Rolling: Supervision Sidelying to sit: +2 for physical assistance, +2 for safety/equipment, Mod assist Supine to sit: Mod assist, +2 for physical assistance Sit to sidelying: +2 for physical assistance, Max assist General bed mobility comments: cues for technique, assist to guide shoulders and for LEs  back into bed Transfers Overall transfer level: Needs assistance Equipment used: Rolling walker (2 wheels) Transfers: Sit to/from Stand, Bed to chair/wheelchair/BSC Sit to Stand: +2 physical assistance, Mod assist Bed to/from chair/wheelchair/BSC transfer type:: Step pivot Step pivot transfers: +2 physical assistance, Min assist Transfer via Lift Equipment: [place] General transfer comment: assist to rise and stedy, 3rd person to stabilize walker Ambulation/Gait General Gait Details: unable to take steps more than to transition to chair Gait velocity: reduced Gait velocity interpretation: <1.31 ft/sec, indicative of household ambulator Pre-gait activities: standing balance and posture cues   ADL: ADL Overall ADL's : Needs assistance/impaired Eating/Feeding: Independent, Sitting Eating/Feeding Details (indicate cue type and reason): pt now on soft diet Grooming: Oral care, Sitting, Set up Upper Body Bathing: Minimal assistance, Standing Lower Body Bathing: Maximal assistance, Sit to/from stand Upper Body Dressing : Minimal assistance, Bed level Lower Body Dressing: Total assistance, Bed level Lower Body Dressing Details (indicate cue type and reason): socks, shoes and AFOs Toilet Transfer: Minimal assistance, +2 for safety/equipment, +2 for physical assistance, BSC/3in1, Stand-pivot Toileting- Clothing Manipulation and Hygiene: Maximal assistance, Sit to/from stand Functional mobility during ADLs: Moderate assistance, +2 for physical assistance, +2 for safety/equipment General ADL Comments: assist due to body habitus, elevated HR< poor activity tolerance, NG and Bedias tubing   Cognition: Cognition Overall Cognitive Status: Within Functional Limits for tasks assessed Orientation Level: Oriented X4 Cognition Arousal/Alertness: Awake/alert Behavior During Therapy: WFL for tasks assessed/performed Overall Cognitive Status: Within Functional Limits for tasks assessed   Physical  Exam: Blood pressure 134/78, pulse 76, temperature 98 F (36.7 C), temperature source Oral, resp. rate (!) 25, height 5\' 8"  (1.727 m), weight (!) 167.7 kg, SpO2 95 %. Physical Exam Vitals and nursing note reviewed. Exam conducted with a chaperone present.  Constitutional:      General: He is not in acute distress.    Appearance: He is obese. He is not ill-appearing.     Comments: Pt sitting up in bed; wife and father in law at bedside; pt has BMI of 53.77 as of today; alert, awake, appropriate, NAD  HENT:     Head: Normocephalic and atraumatic.     Right Ear: External ear normal.     Left Ear: External ear normal.     Nose: Nose normal. No congestion.     Mouth/Throat:     Mouth: Mucous membranes are dry.     Pharynx: No oropharyngeal exudate.  Eyes:     General:        Right eye: No discharge.        Left eye: No discharge.  Cardiovascular:     Rate and Rhythm: Normal rate and regular rhythm.     Heart sounds: Normal heart sounds. No murmur heard.    No gallop.  Pulmonary:     Effort: Pulmonary effort is normal. No respiratory distress.     Breath sounds: Normal breath sounds. No wheezing, rhonchi or rales.     Comments: CTA B/L however with any exertion (I.e exam), started wheezing immediately-  dislikes albuterol Abdominal:     General: Bowel sounds are normal.     Comments: Wound VAC in place with good seal. Minimal, thin cherry colored output Incision is vertical to umbilicus with VAC in place- appears to have good granulating tissue underneath that can be seen Firm, rotund abd; NT; protuberant but not distended; normoactive BS  Genitourinary:    Comments: Male purewick in place Musculoskeletal:     Cervical back: Neck supple. No tenderness.     Comments: Ues 4th/5th digit of L hand flexed at MCP and PIP Ue's 5-/5 in arms except FA 4-/5 B/L   LE's HF 4-/5; KE 4+/5 B/L, DF 2-/5 and PF 2-/5 B/L B/L foot drop  Skin:    General: Skin is warm and dry.     Comments: trace  bogginess of B/L heels- dressing on heels for prevention Needs PRAFO's. Healed stage 2 on right heel Has mild MASD between legs, skin folds and groin  Neurological:     Mental Status: He is alert and oriented to person, place, and time.     Sensory: Sensory deficit present.     Motor: Weakness present.     Comments: Severe neuropathy in legs B/L Has absent sensation to light touch to knees B/L- with foot atrophy B/L However still very decreased sensation to light touch from L1 to L2 B/L- absent L3-S2 B/L   Psychiatric:        Mood and Affect: Mood normal.        Behavior: Behavior normal.         Assessment/Plan: 1. Functional deficits which require 3+ hours per day of interdisciplinary therapy in a comprehensive inpatient rehab setting. Physiatrist is providing close team supervision and 24 hour management of active medical problems listed below. Physiatrist and rehab team continue to assess barriers to discharge/monitor patient progress toward functional and medical goals  Care Tool:  Bathing              Bathing assist       Upper Body Dressing/Undressing Upper body dressing        Upper body assist      Lower Body Dressing/Undressing Lower body dressing            Lower body assist       Toileting Toileting    Toileting assist       Transfers Chair/bed transfer  Transfers assist     Chair/bed transfer assist level: Minimal Assistance - Patient > 75%     Locomotion Ambulation   Ambulation assist   Ambulation activity did not occur: Safety/medical concerns (fatigue, dizziness, weakness, decreased balance)          Walk 10 feet activity   Assist  Walk 10 feet activity did not occur: Safety/medical concerns (fatigue, dizziness, weakness, decreased balance)        Walk 50 feet activity   Assist Walk 50 feet with 2 turns activity did not occur: Safety/medical concerns (fatigue, dizziness, weakness, decreased balance)          Walk 150 feet activity   Assist Walk 150 feet activity did not occur: Safety/medical concerns (fatigue, dizziness, weakness, decreased balance)         Walk 10 feet on uneven surface  activity   Assist Walk 10 feet on uneven surfaces activity did not occur: Safety/medical concerns (fatigue, dizziness, weakness, decreased balance)         Wheelchair     Assist Is the patient using a wheelchair?:  Yes Type of Wheelchair: Manual Wheelchair activity did not occur: Safety/medical concerns (fatigue, weakness)         Wheelchair 50 feet with 2 turns activity    Assist    Wheelchair 50 feet with 2 turns activity did not occur: Safety/medical concerns (fatigue, weakness)       Wheelchair 150 feet activity     Assist  Wheelchair 150 feet activity did not occur: Safety/medical concerns (fatigue, weakness)       Blood pressure 98/65, pulse 84, temperature 98 F (36.7 C), temperature source Oral, resp. rate 18, height 5\' 8"  (1.727 m), weight (!) 165.3 kg, SpO2 93 %.  Medical Problem List and Plan: 1. Functional deficits secondary to debility from CHF exacerbation             -patient may not shower until wound VAC removed             -ELOS/Goals: 12-16 days -supervision to min A 2.  Antithrombotics: -DVT/anticoagulation:  Pharmaceutical: Heparin             -antiplatelet therapy: none   3. Pain Management: Tylenol, oxycodone 60 mg TID as needed- is home dose             -continue pregablin 100 mg daily- just restarted -see # 13   4. Mood/Behavior/Sleep: LCSW to evaluate and provide emotional support             -continue Cymbalta 60 mg BID             -antipsychotic agents: n/a   5. Neuropsych/cognition: This patient is capable of making decisions on his own behalf.   6. Skin/Wound Care: Routine skin care checks             -maintain wound VAC to midline incision; change M/W/F             -consult WOC RN   7. Fluids/Electrolytes/Nutrition: Strict  Is and Os and follow-up chemistries             -continue Prosource, Boost, iron supplements   8: HFmidrangeEF:             -daily weight             -weight up>>resuming Lasix 80 mg daily on 1/16             -losartan 12.5 mg daily resumed 1/16   9: OSA on CPAP- uses home CPAP                10: Atrial fibrillation: persistent but alternating SR; s/p DCCV             -Eliquis held; general surgery to approve restarting- they said would determine 1/17?             -continue Pacerone 400 mg daily, metoprolol 100 mg daily   11: Class 3 obesity: BMI 56.21 1/16   12: Hypertension: monitor TID and prn             -continue Lasix 80 mg daily             -continue losartan 12.5 mg daily   13: Peripheral neuropathy/lumbar spinal stenosis/chronic pain             -neurologist Dr. 2/16 -oxycodone 60 mg TID, lorazepam 0.5 mg, pregabalin 100 mg at home per Dr. Shon Millet   14: GERD/dyspepsia: Protonix 40 mg q HS             -  give Tums before meals   15: BPH: increase Flomax to home dose>> 0.8 mg daily   16: COPD (previous tobacco use): continue Yupelri nebs daily- wheezed at rest with exam exertion- pt refuses Albuterol per chart.    17: Leukocytosis: CT abdomen/pelvis ordered   18: Loose stool: continue probiotics   19. B/L Foot drop due to severe peripheral neuropathy- has AFO's but needs PRAFOs at bedtime- will order.    20. MASD- at groin and skin folds/between legs- continue Nystatin, add Gerrhard's butt cream  LOS: 1 days A FACE TO FACE EVALUATION WAS PERFORMED  Timothy Macias P Timothy Macias 01/09/2023, 11:33 AM

## 2023-01-09 NOTE — Evaluation (Signed)
Physical Therapy Assessment and Plan  Patient Details  Name: Timothy Macias MRN: [id number] Date of Birth: 01-21-1961  PT Diagnosis: Abnormal posture, Abnormality of gait, Difficulty walking, Dizziness and giddiness, Edema, Impaired sensation, Low back pain, Muscle weakness, and Pain in abdomen and "all over body" Rehab Potential: Good ELOS: 2 weeks   Today's Date: 01/09/2023 PT Individual Time: 2426-8341 PT Individual Time Calculation (min): 73 min    Hospital Problem: Principal Problem:   Debility Active Problems:   Small bowel obstruction (HCC)   Acute on chronic congestive heart failure (HCC)   Past Medical History:  Past Medical History:  Diagnosis Date   Atrial fibrillation (HCC)    CHF (congestive heart failure) (HCC)    Fatty liver    Hypertension    Sleep apnea    wears CPAP   Past Surgical History:  Past Surgical History:  Procedure Laterality Date   APPLICATION OF WOUND VAC N/A 01/02/2023   Procedure: APPLICATION OF WOUND VAC;  Surgeon: Timothy Loron, MD;  Location: [facility] OR;  Service: General;  Laterality: N/A;   CARDIOVERSION N/A 01/01/2023   Procedure: CARDIOVERSION;  Surgeon: Timothy Si, MD;  Location: [facility] ENDOSCOPY;  Service: Cardiovascular;  Laterality: N/A;   HIP ARTHROPLASTY Right    KNEE ARTHROPLASTY Right    LAPAROTOMY N/A 01/02/2023   Procedure: EXPLORATORY LAPAROTOMY WITH LYSIS OF ADHESIONS WITH SMALL BOWEL RESECTION;  Surgeon: Timothy Loron, MD;  Location: [facility] OR;  Service: General;  Laterality: N/A;   TONSILLECTOMY     WISDOM TOOTH EXTRACTION      Assessment & Plan Clinical Impression: Patient is a 62 y.o. year old male who presented to his PCP on 12/29/2023 complaining of SOB and rapid weight gain with history of HFrEF and atrial fibrillation on Eliquis. Transported via EMS to [facility] ED. Found to be in rapid atrial fibrillation admitted to hospitalist service. Mild hypoxia supplemented with 3L O2 via [place]. Amiodarone infusion started.  Labs consistent with AKI, elevated hemoglobin. Episode of emesis with nausea and lack of appetite. Transferred to [facility]. Lethargic and RR called on 1/06. A fib given dig, Lasix and BB. Cardiology consulted. HFrEF 45% secondary to NICM. Notes indicated alcohol intake is 6 beers daily for ~45 years.   Noted abdominal distention and wife stated the patient had undergone incarcerated open umbilical hernia repair at [facility] in 2023. KUB with dilated SB loops and general surgery consulted. NGT place with 3 liters of output. Eliquis held and CT abdomen and pelvis obtained. RR called 1/07 due to decreased LOC. Transferred to ICU and PCCM consulted. Eliquis held and heparin infusion started. Required cardioversion 1/09. No improvement in SBO with small bowel protocol and underwent exploratory laparotomy with lysis of adhesions and small bowel resection by Dr. Dwain Macias on 1/10. Fascia was closed and wound VAC placed. Remained on vent post-op. Skin edge bleeding vessel required suturing at bedside on 1/12. Heparin infusion stopped and heparin [place] started for DVT prophylaxis. NGT removed and given ice chips on 1/12. Diet advanced to soft. Restarted Lasix 80 mg and losartan. VAC dressing to be changed every M/W/F. To restart Eliquis when approved by surgical team. The patient requires inpatient physical medicine and rehabilitation evaluations and treatment secondary to dysfunction due to exploratory laparotomy, SBO, heart failure.  Patient currently requires mod with mobility secondary to muscle weakness, muscle joint tightness, and muscle paralysis, decreased cardiorespiratoy endurance, abnormal tone and unbalanced muscle activation, and decreased standing balance, decreased postural control, and decreased balance strategies.  Prior to hospitalization,  patient was modified independent  with mobility and lived with Spouse (wife is a Marine scientist) in a House home.  Home access is  Ramped entrance.  Patient will benefit from skilled PT  intervention to maximize safe functional mobility, minimize fall risk, and decrease caregiver burden for planned discharge home with 24 hour supervision.  Anticipate patient will benefit from follow up [place] at discharge.  PT - End of Session Activity Tolerance: Tolerates 30+ min activity with multiple rests Endurance Deficit: Yes Endurance Deficit Description: required rest break once sitting EOB PT Assessment Rehab Potential (ACUTE/IP ONLY): Good PT Barriers to Discharge: Decreased caregiver support;Incontinence;Wound Care;Weight;Other (comments) PT Barriers to Discharge Comments: neuropathy requiring bilateral AFO's, body habitus, wound care PT Patient demonstrates impairments in the following area(s): Balance;Edema;Endurance;Motor;Nutrition;Pain;Perception;Sensory;Skin Integrity PT Transfers Functional Problem(s): Bed Mobility;Bed to Chair;Car;Furniture PT Locomotion Functional Problem(s): Ambulation;Wheelchair Mobility;Stairs PT Plan PT Intensity: Minimum of 1-2 x/day ,45 to 90 minutes PT Frequency: 5 out of 7 days PT Duration Estimated Length of Stay: 2 weeks PT Treatment/Interventions: Ambulation/gait training;Community reintegration;DME/adaptive equipment instruction;Neuromuscular re-education;Psychosocial support;UE/LE Strength taining/ROM;Stair training;Wheelchair propulsion/positioning;Balance/vestibular training;Discharge planning;Functional electrical stimulation;Pain management;Skin care/wound management;Therapeutic Activities;UE/LE Coordination activities;Cognitive remediation/compensation;Disease management/prevention;Functional mobility training;Patient/family education;Therapeutic Exercise;Visual/perceptual remediation/compensation PT Transfers Anticipated Outcome(s): supervision with LRAD PT Locomotion Anticipated Outcome(s): supervision with LRAD PT Recommendation Recommendations for Other Services: Neuropsych consult;Therapeutic Recreation consult Therapeutic Recreation  Interventions: Stress management Follow Up Recommendations: Home health PT Patient destination: Home Equipment Recommended: To be determined   PT Evaluation Precautions/Restrictions Precautions Precautions: Fall;Other (comment) Precaution Comments: abdominal wound, bilateral AFOs Restrictions Weight Bearing Restrictions: No Pain Interference Pain Interference Pain Effect on Sleep: 3. Frequently Pain Interference with Therapy Activities: 0. Does not apply - I have not received rehabilitationtherapy in the past 5 days Pain Interference with Day-to-Day Activities: 1. Rarely or not at all Home Living/Prior [place] Available Help at Discharge: Family;Available 24 hours/day (wife works day shift full time but FIL is available PRN) Type of Home: House Home Access: Ramped entrance [place]: One level Bathroom Shower/Tub: Walk-in shower;Curtain (1 inch step to enter, has grab bars and TTB) Bathroom Toilet: Handicapped height (grab bars on both sides) Bathroom Accessibility: Yes Additional Comments: has hoyer lift, easy stand, bari RW, WC, bedside commode, bilateral AFO's that he has been wearing for 3-4 years due to neuropathy  Lives With: Spouse (wife is a Marine scientist) Prior Function Level of Independence: Requires assistive device for independence;Independent with homemaking with wheelchair;Needs assistance with ADLs Bath: Minimal Dressing: Minimal  Able to Take Stairs?: No Driving: No Vocation: On disability Vision/Perception  Vision - History Ability to See in Adequate Light: 1 Impaired Vision - Assessment Eye Alignment: Within Functional Limits Ocular Range of Motion: Within Functional Limits Alignment/Gaze Preference: Within Defined Limits Tracking/Visual Pursuits: Decreased smoothness of horizontal tracking Saccades: Within functional limits Convergence: Within functional limits Diplopia Assessment: Disappears with one eye closed;Objects split side to  side Perception Perception: Within Functional Limits Praxis Praxis: Intact  Cognition Overall Cognitive Status: Within Functional Limits for tasks assessed Arousal/Alertness: Awake/alert Orientation Level: Oriented X4 Memory: Appears intact Awareness: Appears intact Problem Solving: Appears intact Safety/Judgment: Appears intact Sensation Sensation Light Touch: Impaired Detail Proprioception: Impaired Detail Proprioception Impaired Details: Impaired RLE;Impaired LLE Additional Comments: decreased sensation along L L3 dermatome >R; then absent sensation from L3 dermatone and below bilaterally Coordination Gross Motor Movements are Fluid and Coordinated: No Fine Motor Movements are Fluid and Coordinated: No Coordination and Movement Description: grossly uncoordinated due to neuropathy, decreased balance/coordination, generalized weakness, and poor endurance Finger Nose  Finger Test: slow and mild dysmetria on LUE Heel Shin Test: unable to perform bilaterally Motor  Motor Motor: Other (comment) Motor - Skilled Clinical Observations: grossly uncoordinated due to neuropathy, decreased balance/coordination, generalized weakness, and poor endurance  Trunk/Postural Assessment  Cervical Assessment Cervical Assessment: Exceptions to [facility] (forward head) Thoracic Assessment Thoracic Assessment: Exceptions to [facility] (thoracic rounding) Lumbar Assessment Lumbar Assessment: Exceptions to [facility] (anterior pelvic tilt) Postural Control Postural Control: Deficits on evaluation Protective Responses: altered due to neuropathy  Balance Balance Balance Assessed: Yes Static Sitting Balance Static Sitting - Balance Support: Feet supported;Bilateral upper extremity supported Static Sitting - Level of Assistance: 5: Stand by assistance (supervision) Dynamic Sitting Balance Dynamic Sitting - Balance Support: Feet supported;No upper extremity supported Dynamic Sitting - Level of Assistance: 5: Stand by  assistance (supervision) Static Standing Balance Static Standing - Balance Support: Bilateral upper extremity supported;During functional activity (bari RW) Static Standing - Level of Assistance: 4: Min assist Dynamic Standing Balance Dynamic Standing - Balance Support: Bilateral upper extremity supported;During functional activity (bari RW) Dynamic Standing - Level of Assistance: 4: Min assist Dynamic Standing - Comments: with transfers Extremity Assessment  RLE Assessment RLE Assessment: Not tested General Strength Comments: grossly 3+/5 (except DF/PF 0/5) LLE Assessment LLE Assessment: Exceptions to [facility] General Strength Comments: grossly 3+/5 (except DF/PF 0/5)  Care Tool Care Tool Bed Mobility Roll left and right activity   Roll left and right assist level: Moderate Assistance - Patient 50 - 74%    Sit to lying activity        Lying to sitting on side of bed activity   Lying to sitting on side of bed assist level: the ability to move from lying on the back to sitting on the side of the bed with no back support.: Moderate Assistance - Patient 50 - 74%     Care Tool Transfers Sit to stand transfer   Sit to stand assist level: Moderate Assistance - Patient 50 - 74%    Chair/bed transfer   Chair/bed transfer assist level: Minimal Assistance - Patient > 75%     Toilet transfer   Assist Level: Minimal Assistance - Patient > 75% (stand pivot)    Licensed conveyancer transfer activity did not occur: Safety/medical concerns (fatigue, dizziness, weakness, decreased balance)        Care Tool Locomotion Ambulation Ambulation activity did not occur: Safety/medical concerns (fatigue, dizziness, weakness, decreased balance)        Walk 10 feet activity Walk 10 feet activity did not occur: Safety/medical concerns (fatigue, dizziness, weakness, decreased balance)       Walk 50 feet with 2 turns activity Walk 50 feet with 2 turns activity did not occur: Safety/medical concerns  (fatigue, dizziness, weakness, decreased balance)      Walk 150 feet activity Walk 150 feet activity did not occur: Safety/medical concerns (fatigue, dizziness, weakness, decreased balance)      Walk 10 feet on uneven surfaces activity Walk 10 feet on uneven surfaces activity did not occur: Safety/medical concerns (fatigue, dizziness, weakness, decreased balance)      Stairs Stair activity did not occur: Safety/medical concerns (fatigue, dizziness, weakness, decreased balance)        Walk up/down 1 step activity Walk up/down 1 step or curb (drop down) activity did not occur: Safety/medical concerns (fatigue, dizziness, weakness, decreased balance)      Walk up/down 4 steps activity Walk up/down 4 steps activity did not occur: Safety/medical concerns (fatigue, dizziness, weakness, decreased balance)  Walk up/down 12 steps activity Walk up/down 12 steps activity did not occur: Safety/medical concerns (fatigue, dizziness, weakness, decreased balance)      Pick up small objects from floor Pick up small object from the floor (from standing position) activity did not occur: Safety/medical concerns (fatigue, dizziness, weakness, decreased balance)      Wheelchair Is the patient using a wheelchair?: Yes Type of Wheelchair: Manual Wheelchair activity did not occur: Safety/medical concerns (fatigue, weakness)      Wheel 50 feet with 2 turns activity Wheelchair 50 feet with 2 turns activity did not occur: Safety/medical concerns (fatigue, weakness)    Wheel 150 feet activity Wheelchair 150 feet activity did not occur: Safety/medical concerns (fatigue, weakness)      Refer to Care Plan for [place] 1 PT Short Term Goal 1 (Week 1): pt will perform bed mobility with min A PT Short Term Goal 2 (Week 1): pt will transfer sit<>stand with LRAD and min A PT Short Term Goal 3 (Week 1): pt will ambulate 78ft with LRAD and min A  Recommendations for other  services: Neuropsych and Therapeutic Recreation  Stress management  Skilled Therapeutic Intervention Evaluation completed (see details above and below) with education on PT POC and goals and individual treatment initiated with focus on functional mobility/transfers, dressing, generalized strengthening and endurance, and dynamic standing balance/coordination. Received pt semi-reclined in bed with RN present administering medications. Pt educated on PT evaluation, CIR policies, and therapy schedule and agreeable. Pt reported pain 9/10 "all over" (premedicated) and increased to 10/10 in abdomen by end of session.   Provided pt with 24x18 manual WC, elevating legrests, and bariatric RW. Pt on 2L O2 with SPO2 95% - weaned pt to RA and SPO2 93% with mobility and left on RA at end of session. Disconnected catheter and pt transferred semi-reclined<>sitting EOB with HOB elevated (to simulate bed at home) and use of bedrails with mod HHA and increased time. Donned socks and bilateral AFO's with total A. Doffed gown and donned pull over shirt with supervision. Pt donned shorts sitting EOB putting head through first. Stood from elevated EOB with bari RW and mod A and required total A to adjust shorts. Pt transferred bed<>WC stand<>pivot with bari RW and min A with total A to manage wound vac. Placed towels and coband at base of legrests to build up for comfort and PA arrived for brief assessment. Concluded session with pt sitting in WC, needs within reach, and seatbelt alarm on. Safety plan updated.  Mobility Bed Mobility Bed Mobility: Rolling Right;Supine to Sit Rolling Right: Moderate Assistance - Patient 50-74% Supine to Sit: Moderate Assistance - Patient 50-74% Transfers Transfers: Sit to Stand;Stand to Sit Sit to Stand: Moderate Assistance - Patient 50-74% Stand to Sit: Contact Guard/Touching assist Transfer (Assistive device): Rolling walker (bariatric) Locomotion  Gait Ambulation: No Gait Gait:  No Wheelchair Mobility Wheelchair Mobility: No   Discharge Criteria: Patient will be discharged from PT if patient refuses treatment 3 consecutive times without medical reason, if treatment goals not met, if there is a change in medical status, if patient makes no progress towards goals or if patient is discharged from hospital.  The above assessment, treatment plan, treatment alternatives and goals were discussed and mutually agreed upon: by patient  Alfonse Alpers PT, DPT  01/09/2023, 12:11 PM

## 2023-01-10 ENCOUNTER — Inpatient Hospital Stay (HOSPITAL_COMMUNITY): Payer: BC Managed Care – PPO

## 2023-01-10 DIAGNOSIS — R5381 Other malaise: Secondary | ICD-10-CM | POA: Diagnosis not present

## 2023-01-10 LAB — CBC
HCT: 42.1 % (ref 39.0–52.0)
Hemoglobin: 13.8 g/dL (ref 13.0–17.0)
MCH: 26.7 pg (ref 26.0–34.0)
MCHC: 32.8 g/dL (ref 30.0–36.0)
MCV: 81.4 fL (ref 80.0–100.0)
Platelets: 270 10*3/uL (ref 150–400)
RBC: 5.17 MIL/uL (ref 4.22–5.81)
RDW: 18.6 % — ABNORMAL HIGH (ref 11.5–15.5)
WBC: 14.2 10*3/uL — ABNORMAL HIGH (ref 4.0–10.5)
nRBC: 0 % (ref 0.0–0.2)

## 2023-01-10 LAB — URINALYSIS, ROUTINE W REFLEX MICROSCOPIC
Bilirubin Urine: NEGATIVE
Glucose, UA: NEGATIVE mg/dL
Ketones, ur: NEGATIVE mg/dL
Leukocytes,Ua: NEGATIVE
Nitrite: NEGATIVE
Protein, ur: NEGATIVE mg/dL
Specific Gravity, Urine: 1.019 (ref 1.005–1.030)
pH: 5 (ref 5.0–8.0)

## 2023-01-10 MED ORDER — MAGNESIUM SULFATE 2 GM/50ML IV SOLN
2.0000 g | Freq: Once | INTRAVENOUS | Status: AC
  Administered 2023-01-10: 2 g via INTRAVENOUS
  Filled 2023-01-10: qty 50

## 2023-01-10 MED ORDER — VITAMIN C 500 MG PO TABS
1000.0000 mg | ORAL_TABLET | Freq: Every day | ORAL | Status: DC
  Administered 2023-01-10 – 2023-01-19 (×10): 1000 mg via ORAL
  Filled 2023-01-10 (×10): qty 2

## 2023-01-10 MED ORDER — POLYVINYL ALCOHOL 1.4 % OP SOLN
1.0000 [drp] | OPHTHALMIC | Status: DC | PRN
  Administered 2023-01-10 – 2023-01-14 (×2): 1 [drp] via OPHTHALMIC
  Filled 2023-01-10: qty 15

## 2023-01-10 MED ORDER — HYDROCERIN EX CREA
TOPICAL_CREAM | Freq: Two times a day (BID) | CUTANEOUS | Status: DC
  Filled 2023-01-10: qty 113

## 2023-01-10 MED ORDER — VITAMIN D (ERGOCALCIFEROL) 1.25 MG (50000 UNIT) PO CAPS
50000.0000 [IU] | ORAL_CAPSULE | ORAL | Status: DC
  Administered 2023-01-10 – 2023-01-17 (×2): 50000 [IU] via ORAL
  Filled 2023-01-10 (×2): qty 1

## 2023-01-10 MED ORDER — APIXABAN 5 MG PO TABS
5.0000 mg | ORAL_TABLET | Freq: Two times a day (BID) | ORAL | Status: DC
  Administered 2023-01-10 – 2023-01-19 (×19): 5 mg via ORAL
  Filled 2023-01-10 (×19): qty 1

## 2023-01-10 NOTE — Progress Notes (Signed)
Physical Therapy Session Note  Patient Details  Name: Timothy Macias MRN: [id number] Date of Birth: [birth year]  Today's Date: 01/10/2023 PT Individual Time: 0845-1000 PT Individual Time Calculation (min): 75 min   Short Term Goals: Week 1:  PT Short Term Goal 1 (Week 1): pt will perform bed mobility with min A PT Short Term Goal 2 (Week 1): pt will transfer sit<>stand with LRAD and min A PT Short Term Goal 3 (Week 1): pt will ambulate 74ft with LRAD and min A  Skilled Therapeutic Interventions/Progress Updates:   Received pt semi-reclined in bed with RN present at bedside administering medications. Pt agreeable to PT treatment and reported pain 8.5/10 in abdomen (premedicated). Provided pt with 18x18 Roho cushion - unable to locate 24x18 Roho and discussed ordering one for home due to pressure sore on sacrum. Session with emphasis on functional mobility/transfers and generalized strengthening and endurance. Pt transferred semi-reclined<>sitting EOB with HOB elevated and use of bedrails with mod A. Donned bilateral AFO's with total A and MD arrived for morning rounds. Pt stood from elevated EOB with bari RW and mod A and transferred into WC via stand<>pivot with bari RW and min A. Sat in WC at sink and brushed teeth, washed face, and washed hair with set up assist and increased time - reported feeling "so much better" afterwards. Pt requesting lotion for dry skin - MD notified.   When looking in mirror, pt reported new onset drooping in L eye with reports of blurred vision and mild headache that started last night, but otherwise feeling fine and motivated to continue working in therapy. Noticed very mild L facial droop and LUE weaker than RUE (however, per OT this is baseline). RN and charge RN notified immediately and present for brief assessment - BP: 107/68. Notified MD and PA and MD ordered stat CT scan. Pt remained sitting in WC (per request) with all needs within reach and staff attending to  care.   Therapy Documentation Precautions:  Precautions Precautions: Fall, Other (comment) Precaution Comments: abdominal wound, bilateral AFOs Restrictions Weight Bearing Restrictions: No  Therapy/Group: Individual Therapy Alfonse Alpers PT, DPT  01/10/2023, 6:56 AM

## 2023-01-10 NOTE — Progress Notes (Addendum)
Patient ID: Timothy Macias, male   DOB: [birth year], 62 y.o.   MRN: [id number]  Family education arranged with patient spouse on 1/27. 10a-12p.   Patient Support Specialist at [facility] informed of pt's current d/c date.

## 2023-01-10 NOTE — Progress Notes (Signed)
Patient was brought down to MRI and was unable to fit for the shoulder exam that was ordered due to body habitus and shoulders are too broad. Ordering Doc notified by secure chat and exam will need to be cancelled.

## 2023-01-10 NOTE — Progress Notes (Signed)
Inpatient Rehabilitation Care Coordinator Assessment and Plan Patient Details  Name: Timothy Macias MRN: [id number] Date of Birth: 03/29/1961  Today's Date: 01/10/2023  Hospital Problems: Principal Problem:   Debility Active Problems:   Small bowel obstruction ([place])   Acute on chronic congestive heart failure ([place])  Past Medical History:  Past Medical History:  Diagnosis Date   Atrial fibrillation ([place])    CHF (congestive heart failure) ([place])    Fatty liver    Hypertension    Sleep apnea    wears CPAP   Past Surgical History:  Past Surgical History:  Procedure Laterality Date   APPLICATION OF WOUND VAC N/A 01/02/2023   Procedure: APPLICATION OF WOUND VAC;  Surgeon: Rolm Bookbinder, MD;  Location: [place];  Service: General;  Laterality: N/A;   CARDIOVERSION N/A 01/01/2023   Procedure: CARDIOVERSION;  Surgeon: Skeet Latch, MD;  Location: [place];  Service: Cardiovascular;  Laterality: N/A;   HIP ARTHROPLASTY Right    KNEE ARTHROPLASTY Right    LAPAROTOMY N/A 01/02/2023   Procedure: EXPLORATORY LAPAROTOMY WITH LYSIS OF ADHESIONS WITH SMALL BOWEL RESECTION;  Surgeon: Rolm Bookbinder, MD;  Location: [place];  Service: General;  Laterality: N/A;   TONSILLECTOMY     WISDOM TOOTH EXTRACTION     Social History:  reports that he has quit smoking. His smoking use included cigarettes. He smoked an average of 1 pack per day. He has never used smokeless tobacco. He reports that he does not currently use alcohol. He reports that he does not use drugs.  Family / Support Systems Patient Roles: Spouse Spouse/Significant Other: Serena Children: N/A Anticipated Caregiver: Armed forces training and education officer (Spouse) Ability/Limitations of Caregiver: Min A/Sup Caregiver Availability: 24/7 Family Dynamics: Support from spouse  Social History Preferred language: English Religion:  Education: Secretary/administrator 2 years Probation officer - How often do you need to have someone help you when you read instructions,  pamphlets, or other written material from your doctor or pharmacy?: Never Writes: Yes Employment Status: Retired Public relations account executive Issues: N/A Guardian/Conservator: N/A   Abuse/Neglect Abuse/Neglect Assessment Can Be Completed: Yes Physical Abuse: Denies Verbal Abuse: Denies Sexual Abuse: Denies Exploitation of patient/patient's resources: Denies Self-Neglect: Denies  Patient response to: Social Isolation - How often do you feel lonely or isolated from those around you?: Never  Emotional Status Recent Psychosocial Issues: coping Psychiatric History: N/A Substance Abuse History: N/A  Patient / Family Perceptions, Expectations & Goals Pt/Family understanding of illness & functional limitations: yes Premorbid pt/family roles/activities: Sup/Min A Anticipated changes in roles/activities/participation: Patient independent previously.  Spouse a CM feel comfortable caring for patient at home. Pt/family expectations/goals: [employer]: None Premorbid Home Care/DME Agencies: Other (Comment) (Hoyer lift, easy stand, RW, WC) Transportation available at discharge: Spouse able to transport Is the patient able to respond to transportation needs?: Yes In the past 12 months, has lack of transportation kept you from medical appointments or from getting medications?: No In the past 12 months, has lack of transportation kept you from meetings, work, or from getting things needed for daily living?: No Resource referrals recommended: Neuropsychology  Discharge Planning Living Arrangements: Spouse/significant other Support Systems: Spouse/significant other Type of Residence: Private residence Insurance Resources: Multimedia programmer (specify) Financial Resources: Family Support Financial Screen Referred: No Living Expenses: Education officer, community Management: Patient, Spouse Does the patient have any problems obtaining your medications?: No Home Management:  Indpendent Patient/Family Preliminary Plans: [place] to manage, if unable spouse able to assist. Care Coordinator Anticipated Follow Up Needs: HH/OP Expected length  of stay: 12-14 Days  Clinical Impression Sw met with patient on 1/17 introduced self and explained role. Sw provided team conference updates. Patients spouse will be present today. Plan is for patient to d/c home on wound Vac. Spouse coming in for family education next week. Spouse is primary caregiver. Patient interested in discussing a electric wheelchair. Doorways 22 inches. No additional questions or concerns.  [name] 01/10/2023, 12:18 PM

## 2023-01-10 NOTE — Progress Notes (Signed)
PROGRESS NOTE   Subjective/Complaints: No new complaints this morning Using purewick at night Had BM last night +pain  ROS: +abdominal pain, denies nausea  Objective:   CT HEAD WO CONTRAST (5MM)  Result Date: 01/10/2023 CLINICAL DATA:  Provided history: Neuro deficit, acute, stroke suspected. Facial paralysis/weakness (cranial nerve 7). Facial droop. EXAM: CT HEAD WITHOUT CONTRAST TECHNIQUE: Contiguous axial images were obtained from the base of the skull through the vertex without intravenous contrast. RADIATION DOSE REDUCTION: This exam was performed according to the departmental dose-optimization program which includes automated exposure control, adjustment of the mA and/or kV according to patient size and/or use of iterative reconstruction technique. COMPARISON:  Head CT 12/30/2022. Brain MRI 07/02/2019. FINDINGS: Brain: No age advanced or lobar predominant parenchymal atrophy. Nonspecific chronic cerebral white matter disease, better appreciated on the prior brain MRI of 07/02/2019. Partially empty sella turcica. There is no acute intracranial hemorrhage. No demarcated cortical infarct. No extra-axial fluid collection. No evidence of an intracranial mass. No midline shift. Vascular: No hyperdense vessel. Atherosclerotic calcifications. Skull: No fracture or aggressive osseous lesion. Sinuses/Orbits: No mass or acute finding within the imaged orbits. No significant paranasal sinus disease at the imaged levels. IMPRESSION: 1. No evidence of acute intracranial abnormality. 2. Nonspecific chronic cerebral white matter disease, better appreciated on the prior brain MRI of 07/02/2019. 3. Redemonstrated partially empty sella turcica. This finding can reflect incidental anatomic variation, or alternatively, can be associated with idiopathic intracranial hypertension (pseudotumor cerebri). Electronically Signed   By: Kellie Simmering D.O.   On:  01/10/2023 10:42   CT ABDOMEN PELVIS W CONTRAST  Result Date: 01/09/2023 CLINICAL DATA:  Postoperative abdominal pain, recent exploratory laparotomy EXAM: CT ABDOMEN AND PELVIS WITH CONTRAST TECHNIQUE: Multidetector CT imaging of the abdomen and pelvis was performed using the standard protocol following bolus administration of intravenous contrast. RADIATION DOSE REDUCTION: This exam was performed according to the departmental dose-optimization program which includes automated exposure control, adjustment of the mA and/or kV according to patient size and/or use of iterative reconstruction technique. CONTRAST:  36mL OMNIPAQUE IOHEXOL 350 MG/ML SOLN COMPARISON:  12/30/2022, 01/02/2023 FINDINGS: Lower chest: Hypoventilatory changes at the lung bases, left greater than right. Hepatobiliary: No focal liver abnormality is seen. No gallstones, gallbladder wall thickening, or biliary dilatation. Pancreas: Unremarkable. No pancreatic ductal dilatation or surrounding inflammatory changes. Spleen: Normal in size without focal abnormality. Adrenals/Urinary Tract: Stable 2.2 cm right adrenal nodule containing macroscopic fat consistent with myelolipoma. The left adrenal is unremarkable. There are numerous bilateral renal cortical cysts which do not require follow-up. Bilateral nephrolithiasis again noted. There has been repositioning of a left lower pole renal calyceal calculus into the left renal pelvis since prior study, measuring 7 mm. No hydronephrosis. No ureteral or bladder calculi. Evaluation of the bladder is limited by streak artifact from right hip arthroplasty. Stomach/Bowel: Interval removal of the enteric catheter, with marked gastric distension. Postsurgical changes from small bowel resection with reanastomosis in the central abdomen. No evidence of high-grade bowel obstruction. Segmental dilation of loops of jejunum within the left mid abdomen measuring up to 5.1 cm with associated gas fluid levels may reflect  segmental ileus. Gas and stool are seen throughout  the bowel to the level of the distal colon. No bowel wall thickening or inflammatory change. Vascular/Lymphatic: No significant vascular findings are present. No enlarged abdominal or pelvic lymph nodes. Reproductive: Prostate is unremarkable. Other: There is no free fluid or free intraperitoneal gas. Postsurgical changes are seen from midline laparotomy, with wound VAC device in place. Musculoskeletal: Right hip arthroplasty. No acute or destructive bony lesions. IMPRESSION: 1. Postsurgical changes from midline laparotomy and small-bowel resection and reanastomosis. 2. Mild distension of proximal loops of jejunum with associated gas fluid levels, favor segmental ileus. There is no evidence of high-grade obstruction, with bowel gas and stool seen to the level of the distal colon. 3. Marked gastric distension. Interval removal of the enteric catheter. 4. Bilateral nonobstructing renal calculi as above. A lower pole left renal calculus has shifted position, and is now located within the left renal pelvis near the UPJ. No evidence of hydronephrosis. Please correlate with site of patient's pain. 5. Bibasilar hypoventilatory changes. 6. 2.2 cm right adrenal myelolipoma. Electronically Signed   By: Sharlet Salina M.D.   On: 01/09/2023 16:50   Recent Labs    01/09/23 0846 01/10/23 0607  WBC 18.2* 14.2*  HGB 15.2 13.8  HCT 47.1 42.1  PLT 332 270   Recent Labs    01/08/23 0054 01/09/23 0846  NA 133* 132*  K 4.2 3.9  CL 99 96*  CO2 26 26  GLUCOSE 110* 104*  BUN 21 21  CREATININE 1.19 1.26*  CALCIUM 8.7* 8.6*    Intake/Output Summary (Last 24 hours) at 01/10/2023 1156 Last data filed at 01/10/2023 0700 Gross per 24 hour  Intake 716 ml  Output 2200 ml  Net -1484 ml        Physical Exam: Vital Signs Blood pressure 102/68, pulse 72, temperature 97.6 F (36.4 C), resp. rate 16, height 5\' 8"  (1.727 m), weight (!) 161.2 kg, SpO2 95 %.  Blood  pressure 134/78, pulse 76, temperature 98 F (36.7 C), temperature source Oral, resp. rate (!) 25, height 5\' 8"  (1.727 m), weight (!) 167.7 kg, SpO2 95 %. Physical Exam Vitals and nursing note reviewed. Exam conducted with a chaperone present.  Constitutional:      General: He is not in acute distress.    Appearance: He is obese. He is not ill-appearing.     Comments: Pt sitting up in bed; wife and father in law at bedside; pt has BMI of 53.77 as of today; alert, awake, appropriate, NAD  HENT:     Head: Normocephalic and atraumatic.     Right Ear: External ear normal.     Left Ear: External ear normal.     Nose: Nose normal. No congestion.     Mouth/Throat:     Mouth: Mucous membranes are dry.     Pharynx: No oropharyngeal exudate.  Eyes:     General:        Right eye: No discharge.        Left eye: No discharge.  Cardiovascular:     Rate and Rhythm: Normal rate and regular rhythm.     Heart sounds: Normal heart sounds. No murmur heard.    No gallop.  Pulmonary:     Effort: Pulmonary effort is normal. No respiratory distress.     Breath sounds: Normal breath sounds. No wheezing, rhonchi or rales.     Comments: CTA B/L however with any exertion (I.e exam), started wheezing immediately- dislikes albuterol Abdominal:     General: Bowel sounds are normal.  Comments: Wound VAC in place with good seal. Minimal, thin cherry colored output Incision is vertical to umbilicus with VAC in place- appears to have good granulating tissue underneath that can be seen Firm, rotund abd; NT; protuberant but not distended; normoactive BS  Genitourinary:    Comments: Male purewick in place Musculoskeletal:     Cervical back: Neck supple. No tenderness.     Comments: Ues 4th/5th digit of L hand flexed at MCP and PIP Ue's 5-/5 in arms except FA 4-/5 B/L   LE's HF 4-/5; KE 4+/5 B/L, DF 2-/5 and PF 2-/5 B/L B/L foot drop  Skin:    General: Skin is warm and dry.     Comments: trace bogginess of  B/L heels- dressing on heels for prevention Needs PRAFO's. Healed stage 2 on right heel Has mild MASD between legs, skin folds and groin  Neurological:     Mental Status: He is alert and oriented to person, place, and time.     Sensory: Sensory deficit present.     Motor: Weakness present.     Comments: Severe neuropathy in legs B/L Has absent sensation to light touch to knees B/L- with foot atrophy B/L However still very decreased sensation to light touch from L1 to L2 B/L- absent L3-S2 B/L   Psychiatric:        Mood and Affect: Mood normal.        Behavior: Behavior normal. Bright and motivated        Assessment/Plan: 1. Functional deficits which require 3+ hours per day of interdisciplinary therapy in a comprehensive inpatient rehab setting. Physiatrist is providing close team supervision and 24 hour management of active medical problems listed below. Physiatrist and rehab team continue to assess barriers to discharge/monitor patient progress toward functional and medical goals  Care Tool:  Bathing    Body parts bathed by patient: Right arm, Left arm, Chest, Abdomen, Face   Body parts bathed by helper: Front perineal area, Buttocks, Right lower leg, Left lower leg     Bathing assist Assist Level: Maximal Assistance - Patient 24 - 49%     Upper Body Dressing/Undressing Upper body dressing   What is the patient wearing?: Pull over shirt    Upper body assist Assist Level: Minimal Assistance - Patient > 75%    Lower Body Dressing/Undressing Lower body dressing      What is the patient wearing?: Underwear/pull up     Lower body assist Assist for lower body dressing: Maximal Assistance - Patient 25 - 49%     Toileting Toileting    Toileting assist Assist for toileting: Maximal Assistance - Patient 25 - 49%     Transfers Chair/bed transfer  Transfers assist     Chair/bed transfer assist level: Minimal Assistance - Patient > 75%      Locomotion Ambulation   Ambulation assist   Ambulation activity did not occur: Safety/medical concerns (fatigue, dizziness, weakness, decreased balance)          Walk 10 feet activity   Assist  Walk 10 feet activity did not occur: Safety/medical concerns (fatigue, dizziness, weakness, decreased balance)        Walk 50 feet activity   Assist Walk 50 feet with 2 turns activity did not occur: Safety/medical concerns (fatigue, dizziness, weakness, decreased balance)         Walk 150 feet activity   Assist Walk 150 feet activity did not occur: Safety/medical concerns (fatigue, dizziness, weakness, decreased balance)  Walk 10 feet on uneven surface  activity   Assist Walk 10 feet on uneven surfaces activity did not occur: Safety/medical concerns (fatigue, dizziness, weakness, decreased balance)         Wheelchair     Assist Is the patient using a wheelchair?: Yes Type of Wheelchair: Manual Wheelchair activity did not occur: Safety/medical concerns (fatigue, weakness)         Wheelchair 50 feet with 2 turns activity    Assist    Wheelchair 50 feet with 2 turns activity did not occur: Safety/medical concerns (fatigue, weakness)       Wheelchair 150 feet activity     Assist  Wheelchair 150 feet activity did not occur: Safety/medical concerns (fatigue, weakness)       Blood pressure 102/68, pulse 72, temperature 97.6 F (36.4 C), resp. rate 16, height 5\' 8"  (1.727 m), weight (!) 161.2 kg, SpO2 95 %.  Medical Problem List and Plan: 1. Functional deficits secondary to debility from CHF exacerbation             -patient may not shower until wound VAC removed             -ELOS/Goals: 12-16 days -supervision to min A 2.  Antithrombotics: -DVT/anticoagulation:  Pharmaceutical: Heparin             -antiplatelet therapy: none   3. Pain Management: Tylenol, oxycodone 60 mg TID as needed- is home dose             -continue pregablin  100 mg daily- just restarted -see # 13   4. Mood/Behavior/Sleep: LCSW to evaluate and provide emotional support             -continue Cymbalta 60 mg BID             -antipsychotic agents: n/a   5. Neuropsych/cognition: This patient is capable of making decisions on his own behalf.   6. Skin/Wound Care: Routine skin care checks             -maintain wound VAC to midline incision; change M/W/F             -consult Wayne RN   7. Fluids/Electrolytes/Nutrition: Strict Is and Os and follow-up chemistries             -continue Prosource, Boost, iron supplements   8: HFmidrangeEF:             -daily weight             -weight up>>resuming Lasix 80 mg daily on 1/16             -losartan 12.5 mg daily resumed 1/16   9: OSA on CPAP- uses home CPAP                10: Atrial fibrillation: persistent but alternating SR; s/p DCCV             -Eliquis held; general surgery to approve restarting- they said would determine 1/17?             -continue Pacerone 400 mg daily, metoprolol 100 mg daily   11: Class 3 obesity: BMI 56.21 1/16   12: Hypertension: monitor TID and prn             -continue Lasix 80 mg daily             -continue losartan 12.5 mg daily   13: Peripheral neuropathy/lumbar spinal stenosis/chronic pain             -  neurologist Dr. Metta Clines -oxycodone 60 mg TID, lorazepam 0.5 mg, pregabalin 100 mg at home per Dr. Gar Ponto   14: GERD/dyspepsia: Protonix 40 mg q HS             -give Tums before meals   15: BPH: increase Flomax to home dose>> 0.8 mg daily   16: COPD (previous tobacco use): continue Yupelri nebs daily- wheezed at rest with exam exertion- pt refuses Albuterol per chart.    17: Leukocytosis: CT abdomen/pelvis ordered   18: Loose stool: continue probiotics   19. B/L Foot drop due to severe peripheral neuropathy- has AFO's but needs PRAFOs at bedtime- will order.    20. MASD- at groin and skin folds/between legs- continue Nystatin, add Gerrhard's butt  cream  21. Left shoulder pain: XR ordered.   22. Dry skin: Eucerin ordered  23. Urinary urgency: purewick ordered for night  LOS: 2 days A FACE TO FACE EVALUATION WAS PERFORMED  Martha Clan P Kasidi Shanker 01/10/2023, 11:56 AM

## 2023-01-10 NOTE — Progress Notes (Signed)
Physical Therapy Session Note  Patient Details  Name: Timothy Macias MRN: [id number] Date of Birth: 23-May-1961  Today's Date: 01/10/2023 PT Individual Time: 1115-1208 PT Individual Time Calculation (min): 53 min   Short Term Goals: Week 1:  PT Short Term Goal 1 (Week 1): pt will perform bed mobility with min A PT Short Term Goal 2 (Week 1): pt will transfer sit<>stand with LRAD and min A PT Short Term Goal 3 (Week 1): pt will ambulate 11ft with LRAD and min A  Skilled Therapeutic Interventions/Progress Updates:    Pt received supine in bed awake and eager to participate in therapy session. Pt denies any symptoms at this time. Vitals supine in bed to start session: BP 98/67 (MAP 75), HR 79bpm (utilized L forearm for BP assessment) Supine>sitting R EOB, HOB elevated and using bedrail, with HHA to reach bedrail but then able to perform with supervision and increased time/effort. Cuing for breathing during movements as pt tends to hold his breath. Pt already wearing his personal B LE GRAFOs and tennis shoes. Vitals sitting EOB: BP 102/80 (MAP 87), HR 84-121bpm  Sit>stand EOB>weighted bari-RW with CGA for steadying/safety but pt able to come to standing without assist for lifting. Standing vitals: BP 116/88 (MAP 97), HR 91bpm with pt denying symptoms outside of his "norm" L stand pivot EOB>w/c using bari-RW with CGA for steadying/safety and cuing for improved AD management - pt picks LEs up and places them on floor to make an audible sound to ensure he is putting them down fully since he is not able to feel his feet.  Gait training ~90ft in room using bari-RW with CGA for safety and +2 providing w/c follow for safety - pt relies heavily on B UE support on RW and does well clearing feet and continues to land with auditory feedback - slow gait speed with decreased step lengths and slight forward trunk flexion. Vitals after gait: BP 139/94 (MAP 105), HR 87bpm - increased with activity, recommending  continuing to monitor  Pt states his main concern is improving his hand/wrist strength - notified primary OT.  Therapist inflated pt's Roho cushion for improved pressure relief and pt reports no longer feeling pain/pressure on sacrum with this adjustment. No other more well fiting wheelchair cushion available at this time.  Pt left seated in w/c with needs in reach, lines intact, wound vac plugged in, and seat belt alarm on.   Therapy Documentation Precautions:  Precautions Precautions: Fall, Other (comment) Precaution Comments: abdominal wound, bilateral AFOs Restrictions Weight Bearing Restrictions: No   Pain:  Reports he has pain at wound vac site but no intervention needed at this time, premedicated.   Therapy/Group: Individual Therapy  Tawana Scale , PT, DPT, NCS, CSRS 01/10/2023, 8:01 AM

## 2023-01-10 NOTE — Progress Notes (Signed)
Left eye ptosis since admission to [facility]. Has had mild left eye irritating symptoms since initial admission. Lacrilube nt very effective. No other neurlogic symptoms.  OOB to WC eating lunch. No other complaints. A O times 4. BUE mobility and strength WNL. Speech and cognition normal. VSS.  CT head: Narrative & Impression  CLINICAL DATA:  Provided history: Neuro deficit, acute, stroke suspected. Facial paralysis/weakness (cranial nerve 7). Facial droop.   EXAM: CT HEAD WITHOUT CONTRAST   TECHNIQUE: Contiguous axial images were obtained from the base of the skull through the vertex without intravenous contrast.   RADIATION DOSE REDUCTION: This exam was performed according to the departmental dose-optimization program which includes automated exposure control, adjustment of the mA and/or kV according to patient size and/or use of iterative reconstruction technique.   COMPARISON:  Head CT 12/30/2022. Brain MRI 07/02/2019.   FINDINGS: Brain:   No age advanced or lobar predominant parenchymal atrophy.   Nonspecific chronic cerebral white matter disease, better appreciated on the prior brain MRI of 07/02/2019.   Partially empty sella turcica.   There is no acute intracranial hemorrhage.   No demarcated cortical infarct.   No extra-axial fluid collection.   No evidence of an intracranial mass.   No midline shift.   Vascular: No hyperdense vessel. Atherosclerotic calcifications.   Skull: No fracture or aggressive osseous lesion.   Sinuses/Orbits: No mass or acute finding within the imaged orbits. No significant paranasal sinus disease at the imaged levels.   IMPRESSION: 1. No evidence of acute intracranial abnormality. 2. Nonspecific chronic cerebral white matter disease, better appreciated on the prior brain MRI of 07/02/2019. 3. Redemonstrated partially empty sella turcica. This finding can reflect incidental anatomic variation, or alternatively, can be associated  with idiopathic intracranial hypertension (pseudotumor cerebri).     Electronically Signed   By: Kellie Simmering D.O.   On: 01/10/2023 10:42   Will order artificial tears.

## 2023-01-10 NOTE — Progress Notes (Signed)
Occupational Therapy Session Note  Patient Details  Name: Timothy Macias MRN: [id number] Date of Birth: 10/18/1961  Today's Date: 01/10/2023 OT Individual Time: 1300-1415 OT Individual Time Calculation (min): 75 min    Short Term Goals: Week 1:  OT Short Term Goal 1 (Week 1): Pt will complete sit > stand in prep for ADL with Min A using LRAD OT Short Term Goal 2 (Week 1): Pt will complete toileting with min A using LRAD OT Short Term Goal 3 (Week 1): Pt will complete toilet transfer with CGA using LRAD  Skilled Therapeutic Interventions/Progress Updates:  Skilled OT intervention completed with focus on fine/gross motor coordination and strength, AE education. Pt received seated in w/c, agreeable to session. 8/10 pain reported in abdomen, without meds, however pt requesting to hold off on meds. OT offered repositioning, rest breaks and distraction for pain reduction throughout.  Pt declined self care needs at this time, but requested to work on his hands and wrists due to weakness associated with neuropathy and difficulty opening containers. Transported dependently in w/c for time <> gym.  9 hole peg test - Norms for healthy males 61-65 R 20.87 L 21.60 -Pt results  R hand- 51.90 sec  L hand- 48.99 sec  Discussed modifications such as adaptive jar openers or dycem to make opening twist/jar containers easier with a progressive condition however also discussed ways to strengthen and address these needed skills for ADL management.  Seated at table top, pt completed the following: -placing small pegs into small pegboard (R then L hand), with tweezers used to remove pegs from geo board (R then L hand) to address sustained pinch and strength. Increased difficulty and significant increase in time needed to complete with L hand however pt is R dominant. Biggest complaint is impaired sensation that limits his ability to sustain grasp -Issued pt theraputty (yellow) for various pinches/squeezes  including lateral, pincer and gross grasping and pt completed during session -Issued pt a piece of dycem to allow for modified gripping and opening of containers  Pt remained seated in w/c, with belt alarm on/activated, and with all needs in reach at end of session.   Therapy Documentation Precautions:  Precautions Precautions: Fall, Other (comment) Precaution Comments: abdominal wound, bilateral AFOs Restrictions Weight Bearing Restrictions: No    Therapy/Group: Individual Therapy  [name], MS, OTR/L  01/10/2023, 2:21 PM

## 2023-01-10 NOTE — Progress Notes (Signed)
Progress Note     Subjective: Overall feeling well this AM. Denies nausea. Passing flatus and having BMs. CT yesterday without abscess or leak. Pt does report some dysuria to me today.   Objective: Vital signs in last 24 hours: Temp:  [97.6 F (36.4 C)-98 F (36.7 C)] 97.6 F (36.4 C) (01/18 0555) Pulse Rate:  [72-84] 72 (01/18 0555) Resp:  [15-18] 16 (01/18 0555) BP: (100-120)/(63-68) 102/68 (01/18 0555) SpO2:  [92 %-95 %] 95 % (01/18 0555) Weight:  [161.2 kg] 161.2 kg (01/18 0555) Last BM Date : 01/08/23  Intake/Output from previous day: 01/17 0701 - 01/18 0700 In: 716 [P.O.:716] Out: 2200 [Urine:1700; Drains:500] Intake/Output this shift: No intake/output data recorded.  PE: General: pleasant, WD, obese male who is laying in bed in NAD Heart: regular, rate, and rhythm.  Lungs: Respiratory effort nonlabored Abd: soft, appropriately ttp, midline wound with VAC present and SS fluid in canister  Psych: A&Ox3 with an appropriate affect.    Lab Results:  Recent Labs    01/09/23 0846 01/10/23 0607  WBC 18.2* 14.2*  HGB 15.2 13.8  HCT 47.1 42.1  PLT 332 270    BMET Recent Labs    01/08/23 0054 01/09/23 0846  NA 133* 132*  K 4.2 3.9  CL 99 96*  CO2 26 26  GLUCOSE 110* 104*  BUN 21 21  CREATININE 1.19 1.26*  CALCIUM 8.7* 8.6*    PT/INR No results for input(s): "LABPROT", "INR" in the last 72 hours. CMP     Component Value Date/Time   NA 132 (L) 01/09/2023 0846   K 3.9 01/09/2023 0846   CL 96 (L) 01/09/2023 0846   CO2 26 01/09/2023 0846   GLUCOSE 104 (H) 01/09/2023 0846   BUN 21 01/09/2023 0846   CREATININE 1.26 (H) 01/09/2023 0846   CALCIUM 8.6 (L) 01/09/2023 0846   PROT 6.5 01/09/2023 0846   ALBUMIN 3.1 (L) 01/09/2023 0846   AST 36 01/09/2023 0846   ALT 36 01/09/2023 0846   ALKPHOS 63 01/09/2023 0846   BILITOT 1.0 01/09/2023 0846   GFRNONAA >60 01/09/2023 0846   Lipase  No results found for: "LIPASE"     Studies/Results: CT ABDOMEN  PELVIS W CONTRAST  Result Date: 01/09/2023 CLINICAL DATA:  Postoperative abdominal pain, recent exploratory laparotomy EXAM: CT ABDOMEN AND PELVIS WITH CONTRAST TECHNIQUE: Multidetector CT imaging of the abdomen and pelvis was performed using the standard protocol following bolus administration of intravenous contrast. RADIATION DOSE REDUCTION: This exam was performed according to the departmental dose-optimization program which includes automated exposure control, adjustment of the mA and/or kV according to patient size and/or use of iterative reconstruction technique. CONTRAST:  37mL OMNIPAQUE IOHEXOL 350 MG/ML SOLN COMPARISON:  12/30/2022, 01/02/2023 FINDINGS: Lower chest: Hypoventilatory changes at the lung bases, left greater than right. Hepatobiliary: No focal liver abnormality is seen. No gallstones, gallbladder wall thickening, or biliary dilatation. Pancreas: Unremarkable. No pancreatic ductal dilatation or surrounding inflammatory changes. Spleen: Normal in size without focal abnormality. Adrenals/Urinary Tract: Stable 2.2 cm right adrenal nodule containing macroscopic fat consistent with myelolipoma. The left adrenal is unremarkable. There are numerous bilateral renal cortical cysts which do not require follow-up. Bilateral nephrolithiasis again noted. There has been repositioning of a left lower pole renal calyceal calculus into the left renal pelvis since prior study, measuring 7 mm. No hydronephrosis. No ureteral or bladder calculi. Evaluation of the bladder is limited by streak artifact from right hip arthroplasty. Stomach/Bowel: Interval removal of the enteric catheter, with  marked gastric distension. Postsurgical changes from small bowel resection with reanastomosis in the central abdomen. No evidence of high-grade bowel obstruction. Segmental dilation of loops of jejunum within the left mid abdomen measuring up to 5.1 cm with associated gas fluid levels may reflect segmental ileus. Gas and stool  are seen throughout the bowel to the level of the distal colon. No bowel wall thickening or inflammatory change. Vascular/Lymphatic: No significant vascular findings are present. No enlarged abdominal or pelvic lymph nodes. Reproductive: Prostate is unremarkable. Other: There is no free fluid or free intraperitoneal gas. Postsurgical changes are seen from midline laparotomy, with wound VAC device in place. Musculoskeletal: Right hip arthroplasty. No acute or destructive bony lesions. IMPRESSION: 1. Postsurgical changes from midline laparotomy and small-bowel resection and reanastomosis. 2. Mild distension of proximal loops of jejunum with associated gas fluid levels, favor segmental ileus. There is no evidence of high-grade obstruction, with bowel gas and stool seen to the level of the distal colon. 3. Marked gastric distension. Interval removal of the enteric catheter. 4. Bilateral nonobstructing renal calculi as above. A lower pole left renal calculus has shifted position, and is now located within the left renal pelvis near the UPJ. No evidence of hydronephrosis. Please correlate with site of patient's pain. 5. Bibasilar hypoventilatory changes. 6. 2.2 cm right adrenal myelolipoma. Electronically Signed   By: Randa Ngo M.D.   On: 01/09/2023 16:50    Anti-infectives: Anti-infectives (From admission, onward)    None        Assessment/Plan  POD8, s/p ex lap with LOA and SBR for SBO, Dr. Donne Hazel 1/10 - advance back to soft diet - continue VAC and change M/W/F - will see tomorrow  - mobilize with nursing and PT - IS, pulm toilet - continue chronic home pain meds  - CT yesterday without significant post-op complication and WBC back down to 14 today - check UA and Cx since patient reported dysuria this AM   FEN - soft diet  VTE - SQH, ok to resume eliquis today  ID - completed 24 hrs post op abx   - below per TRH -  Afib T. CHF HTN OSA NASH Acute on CKD  Morbid obesity   LOS: 2  days    Norm Parcel, [facility] Surgery 01/10/2023, 9:10 AM Please see Amion for pager number during day hours 7:00am-4:30pm

## 2023-01-10 NOTE — Progress Notes (Signed)
RN notified by physical therapist of patient c/o of left eye droop and blurred vision during session. RN assessed patient and slight left sided facial drop noted c LUE weaker than RUE. MD notified and STAT CT ordered. Patient taken down to CT within 54mins

## 2023-01-11 DIAGNOSIS — R5381 Other malaise: Secondary | ICD-10-CM | POA: Diagnosis not present

## 2023-01-11 LAB — IRON AND TIBC
Iron: 50 ug/dL (ref 45–182)
Saturation Ratios: 17 % — ABNORMAL LOW (ref 17.9–39.5)
TIBC: 300 ug/dL (ref 250–450)
UIBC: 250 ug/dL

## 2023-01-11 LAB — URINE CULTURE

## 2023-01-11 LAB — VITAMIN B12: Vitamin B-12: 734 pg/mL (ref 180–914)

## 2023-01-11 LAB — MAGNESIUM: Magnesium: 1.7 mg/dL (ref 1.7–2.4)

## 2023-01-11 MED ORDER — DOCUSATE SODIUM 100 MG PO CAPS
100.0000 mg | ORAL_CAPSULE | Freq: Two times a day (BID) | ORAL | Status: DC
  Administered 2023-01-11: 100 mg via ORAL
  Filled 2023-01-11: qty 1

## 2023-01-11 MED ORDER — DOCUSATE SODIUM 100 MG PO CAPS
100.0000 mg | ORAL_CAPSULE | Freq: Three times a day (TID) | ORAL | Status: DC
  Administered 2023-01-11 – 2023-01-17 (×17): 100 mg via ORAL
  Filled 2023-01-11 (×17): qty 1

## 2023-01-11 MED ORDER — POLYETHYLENE GLYCOL 3350 17 G PO PACK
17.0000 g | PACK | Freq: Every day | ORAL | Status: DC
  Administered 2023-01-11 – 2023-01-19 (×6): 17 g via ORAL
  Filled 2023-01-11 (×8): qty 1

## 2023-01-11 NOTE — Progress Notes (Signed)
Patient ID: Timothy Macias, male   DOB: 08/05/1961, 62 y.o.   MRN: [id number] Met with the patient to review current situation, rehab process team conference and plan of care. Reviewed medications, dietary modifications and HF zone tool/daily weights. Patient noted feeling "hot" on top of head/forehead and requested diet modification so he could order a plain cheeseburger. Nothing tastes good. Forwarded concerns to MD/PA. Reported wearing PRAFO boots, heel healing. WOC changing abd wound dressing at present. LBM 1/17 with reports of flatus no BM since 01/09/23 but refusing laxatives; encouraged medications to aide with constipation.  Continue to follow along to discharge to address educational needs. Given a packet with information on secondary risk management; reports wife is a Marine scientist and will assist his with this.  Margarito Liner

## 2023-01-11 NOTE — Evaluation (Signed)
Recreational Therapy Assessment and Plan  Patient Details  Name: Timothy Macias MRN: [id number] Date of Birth: 01-21-1961 Today's Date: 01/11/2023  Rehab Potential:  Good ELOS:   d/c 1/31  Assessment Hospital Problem: Principal Problem:   Debility Active Problems:   Small bowel obstruction ([place])   Acute on chronic congestive heart failure ([place])     Past Medical History:      Past Medical History:  Diagnosis Date   Atrial fibrillation ([place])     CHF (congestive heart failure) ([place])     Fatty liver     Hypertension     Sleep apnea      wears CPAP    Past Surgical History:       Past Surgical History:  Procedure Laterality Date   APPLICATION OF WOUND VAC N/A 01/02/2023    Procedure: APPLICATION OF WOUND VAC;  Surgeon: Rolm Bookbinder, MD;  Location: [place];  Service: General;  Laterality: N/A;   CARDIOVERSION N/A 01/01/2023    Procedure: CARDIOVERSION;  Surgeon: Skeet Latch, MD;  Location: [place];  Service: Cardiovascular;  Laterality: N/A;   HIP ARTHROPLASTY Right     KNEE ARTHROPLASTY Right     LAPAROTOMY N/A 01/02/2023    Procedure: EXPLORATORY LAPAROTOMY WITH LYSIS OF ADHESIONS WITH SMALL BOWEL RESECTION;  Surgeon: Rolm Bookbinder, MD;  Location: [place];  Service: General;  Laterality: N/A;   TONSILLECTOMY       WISDOM TOOTH EXTRACTION          Assessment & Plan Clinical Impression: Patient is a 62 y.o. year old male who presented to his PCP on 12/29/2023 complaining of SOB and rapid weight gain with history of HFrEF and atrial fibrillation on Eliquis. Transported via EMS to [facility] ED. Found to be in rapid atrial fibrillation admitted to hospitalist service. Mild hypoxia supplemented with 3L O2 via [place]. Amiodarone infusion started. Labs consistent with AKI, elevated hemoglobin. Episode of emesis with nausea and lack of appetite. Transferred to [facility]. Lethargic and RR called on 1/06. A fib given dig, Lasix and BB. Cardiology consulted. HFrEF 45% secondary to  NICM. Notes indicated alcohol intake is 6 beers daily for ~45 years.   Noted abdominal distention and wife stated the patient had undergone incarcerated open umbilical hernia repair at [facility] in 2023. KUB with dilated SB loops and general surgery consulted. NGT place with 3 liters of output. Eliquis held and CT abdomen and pelvis obtained. RR called 1/07 due to decreased LOC. Transferred to ICU and PCCM consulted. Eliquis held and heparin infusion started. Required cardioversion 1/09. No improvement in SBO with small bowel protocol and underwent exploratory laparotomy with lysis of adhesions and small bowel resection by Dr. Donne Hazel on 1/10. Fascia was closed and wound VAC placed. Remained on vent post-op. Skin edge bleeding vessel required suturing at bedside on 1/12. Heparin infusion stopped and heparin [place] started for DVT prophylaxis. NGT removed and given ice chips on 1/12. Diet advanced to soft. Restarted Lasix 80 mg and losartan. VAC dressing to be changed every M/W/F. To restart Eliquis when approved by surgical team. The patient requires inpatient physical medicine and rehabilitation evaluations and treatment secondary to dysfunction due to exploratory laparotomy, SBO, heart failure.  Pt presents with decreased activity tolerance, decreased functional mobility, decreased balance, decreased coordination, feelings of stress/anxiety Limiting pt's independence with leisure/community pursuits.  Met with pt today to discuss TR services including leisure education, activity analysis/modifications and stress management.  Also discussed the importance of social, emotional, spiritual health in  addition to physical health and their effects on overall health and wellness.  Pt stated understanding.  Plan  Min 1 TR session per week during LOS  Recommendations for other services: Neuropsych  Discharge Criteria: Patient will be discharged from TR if patient refuses treatment 3 consecutive times without medical  reason.  If treatment goals not met, if there is a change in medical status, if patient makes no progress towards goals or if patient is discharged from hospital.  The above assessment, treatment plan, treatment alternatives and goals were discussed and mutually agreed upon: by patient  [place] 01/11/2023, 11:09 AM

## 2023-01-11 NOTE — Progress Notes (Signed)
Occupational Therapy Session Note  Patient Details  Name: Kazim Corrales MRN: [id number] Date of Birth: 1961-06-08  Today's Date: 01/11/2023 OT Individual Time: 4481-8563 OT Individual Time Calculation (min): 47 min    Short Term Goals: Week 1:  OT Short Term Goal 1 (Week 1): Pt will complete sit > stand in prep for ADL with Min A using LRAD OT Short Term Goal 2 (Week 1): Pt will complete toileting with min A using LRAD OT Short Term Goal 3 (Week 1): Pt will complete toilet transfer with CGA using LRAD  Skilled Therapeutic Interventions/Progress Updates: Patient received resting in bed. Agreeable to OT treatment working on self care. Unable to shower due to wounds, but sat EOB for bathing, grooming and dressing tasks. Continue with skilled OT POC to improve self care skills and transfer safety.     Therapy Documentation Precautions:  Precautions Precautions: Fall, Other (comment) Precaution Comments: abdominal wound, bilateral AFOs Restrictions Weight Bearing Restrictions: No General: General OT Amount of Missed Time: 13 Minutes Vital Signs:  Pain: Pain Assessment Pain Score: 8  ADL: ADL Eating: Set up Where Assessed-Eating: Wheelchair Grooming: Setup Where Assessed-Grooming: EOB Upper Body Bathing: Supervision/ setup Where Assessed-Upper Body Bathing: EOB Lower Body Bathing: Maximal assistance  Where Assessed-Lower Body Bathing: EOB Upper Body Dressing: Set-up Where Assessed-Upper Body Dressing: EOB Lower Body Dressing: Maximal assistance Where Assessed-Lower Body Dressing: EOB Toileting: Maximal assistance Where Assessed-Toileting: Bedside Commode Toilet Transfer: Minimal assistance Toilet Transfer Method: Stand pivot Science writer: Extra wide bedside commode Tub/Shower Transfer: Unable to assess Tub/Shower Transfer Method: Unable to assess Social research officer, government: Unable to assess Social research officer, government Method: Unable to assess Vision-  glasses    Balance- Good dynamic sitting balance.   Other Treatments:  Patient seen for EOB tasks due to wound care immediately following OT treatment.    Therapy/Group: Individual Therapy  Hermina Barters 01/11/2023, 12:12 PM

## 2023-01-11 NOTE — Progress Notes (Signed)
PROGRESS NOTE   Subjective/Complaints: Denies abdominal pain. Asks to be advanced to regular diet. Last BM was 1/17. Passed gas today. Discussed increasing colace to TID  ROS: +abdominal pain, denies nausea, +constipation  Objective:   DG Shoulder Left  Result Date: 01/10/2023 CLINICAL DATA:  Left shoulder pain. EXAM: LEFT SHOULDER - 2+ VIEW COMPARISON:  None Available. FINDINGS: No evidence for an acute fracture. No subluxation or dislocation. No shoulder separation. Advanced degenerative changes are noted in the glenohumeral joint. IMPRESSION: Advanced degenerative changes in the glenohumeral joint. No acute bony abnormality. Electronically Signed   By: Kennith Center M.D.   On: 01/10/2023 15:57   CT HEAD WO CONTRAST ( )  Result Date: 01/10/2023 CLINICAL DATA:  Provided history: Neuro deficit, acute, stroke suspected. Facial paralysis/weakness (cranial nerve 7). Facial droop. EXAM: CT HEAD WITHOUT CONTRAST TECHNIQUE: Contiguous axial images were obtained from the base of the skull through the vertex without intravenous contrast. RADIATION DOSE REDUCTION: This exam was performed according to the departmental dose-optimization program which includes automated exposure control, adjustment of the mA and/or kV according to patient size and/or use of iterative reconstruction technique. COMPARISON:  Head CT 12/30/2022. Brain MRI 07/02/2019. FINDINGS: Brain: No age advanced or lobar predominant parenchymal atrophy. Nonspecific chronic cerebral white matter disease, better appreciated on the prior brain MRI of 07/02/2019. Partially empty sella turcica. There is no acute intracranial hemorrhage. No demarcated cortical infarct. No extra-axial fluid collection. No evidence of an intracranial mass. No midline shift. Vascular: No hyperdense vessel. Atherosclerotic calcifications. Skull: No fracture or aggressive osseous lesion. Sinuses/Orbits: No mass or  acute finding within the imaged orbits. No significant paranasal sinus disease at the imaged levels. IMPRESSION: 1. No evidence of acute intracranial abnormality. 2. Nonspecific chronic cerebral white matter disease, better appreciated on the prior brain MRI of 07/02/2019. 3. Redemonstrated partially empty sella turcica. This finding can reflect incidental anatomic variation, or alternatively, can be associated with idiopathic intracranial hypertension (pseudotumor cerebri). Electronically Signed   By: Jackey Loge D.O.   On: 01/10/2023 10:42   CT ABDOMEN PELVIS W CONTRAST  Result Date: 01/09/2023 CLINICAL DATA:  Postoperative abdominal pain, recent exploratory laparotomy EXAM: CT ABDOMEN AND PELVIS WITH CONTRAST TECHNIQUE: Multidetector CT imaging of the abdomen and pelvis was performed using the standard protocol following bolus administration of intravenous contrast. RADIATION DOSE REDUCTION: This exam was performed according to the departmental dose-optimization program which includes automated exposure control, adjustment of the mA and/or kV according to patient size and/or use of iterative reconstruction technique. CONTRAST:  49mL OMNIPAQUE IOHEXOL 350 MG/ML SOLN COMPARISON:  12/30/2022, 01/02/2023 FINDINGS: Lower chest: Hypoventilatory changes at the lung bases, left greater than right. Hepatobiliary: No focal liver abnormality is seen. No gallstones, gallbladder wall thickening, or biliary dilatation. Pancreas: Unremarkable. No pancreatic ductal dilatation or surrounding inflammatory changes. Spleen: Normal in size without focal abnormality. Adrenals/Urinary Tract: Stable 2.2 cm right adrenal nodule containing macroscopic fat consistent with myelolipoma. The left adrenal is unremarkable. There are numerous bilateral renal cortical cysts which do not require follow-up. Bilateral nephrolithiasis again noted. There has been repositioning of a left lower pole renal calyceal calculus into the left renal  pelvis since prior study,  measuring 7 mm. No hydronephrosis. No ureteral or bladder calculi. Evaluation of the bladder is limited by streak artifact from right hip arthroplasty. Stomach/Bowel: Interval removal of the enteric catheter, with marked gastric distension. Postsurgical changes from small bowel resection with reanastomosis in the central abdomen. No evidence of high-grade bowel obstruction. Segmental dilation of loops of jejunum within the left mid abdomen measuring up to 5.1 cm with associated gas fluid levels may reflect segmental ileus. Gas and stool are seen throughout the bowel to the level of the distal colon. No bowel wall thickening or inflammatory change. Vascular/Lymphatic: No significant vascular findings are present. No enlarged abdominal or pelvic lymph nodes. Reproductive: Prostate is unremarkable. Other: There is no free fluid or free intraperitoneal gas. Postsurgical changes are seen from midline laparotomy, with wound VAC device in place. Musculoskeletal: Right hip arthroplasty. No acute or destructive bony lesions. IMPRESSION: 1. Postsurgical changes from midline laparotomy and small-bowel resection and reanastomosis. 2. Mild distension of proximal loops of jejunum with associated gas fluid levels, favor segmental ileus. There is no evidence of high-grade obstruction, with bowel gas and stool seen to the level of the distal colon. 3. Marked gastric distension. Interval removal of the enteric catheter. 4. Bilateral nonobstructing renal calculi as above. A lower pole left renal calculus has shifted position, and is now located within the left renal pelvis near the UPJ. No evidence of hydronephrosis. Please correlate with site of patient's pain. 5. Bibasilar hypoventilatory changes. 6. 2.2 cm right adrenal myelolipoma. Electronically Signed   By: Randa Ngo M.D.   On: 01/09/2023 16:50   Recent Labs    01/09/23 0846 01/10/23 0607  WBC 18.2* 14.2*  HGB 15.2 13.8  HCT 47.1 42.1  PLT  332 270   Recent Labs    01/09/23 0846  NA 132*  K 3.9  CL 96*  CO2 26  GLUCOSE 104*  BUN 21  CREATININE 1.26*  CALCIUM 8.6*    Intake/Output Summary (Last 24 hours) at 01/11/2023 1419 Last data filed at 01/11/2023 0700 Gross per 24 hour  Intake 360 ml  Output 1525 ml  Net -1165 ml        Physical Exam: Vital Signs Blood pressure (!) 107/58, pulse 73, temperature 98.3 F (36.8 C), resp. rate 16, height 5\' 8"  (1.727 m), weight (!) 159.7 kg, SpO2 94 %.  Blood pressure 134/78, pulse 76, temperature 98 F (36.7 C), temperature source Oral, resp. rate (!) 25, height 5\' 8"  (1.727 m), weight (!) 167.7 kg, SpO2 95 %. Physical Exam Vitals and nursing note reviewed. Exam conducted with a chaperone present.  Constitutional:      General: He is not in acute distress.    Appearance: He is obese. He is not ill-appearing.     Comments: Pt sitting up in bed; BMI of 53.53 as of today; alert, awake, appropriate, NAD  HENT:     Head: Normocephalic and atraumatic.     Right Ear: External ear normal.     Left Ear: External ear normal.     Nose: Nose normal. No congestion.     Mouth/Throat:     Mouth: Mucous membranes are dry.     Pharynx: No oropharyngeal exudate.  Eyes:     General:        Right eye: No discharge.        Left eye: No discharge.  Cardiovascular:     Rate and Rhythm: Decreased activity tolerance. Normal rate and regular rhythm.     Heart  sounds: Normal heart sounds. No murmur heard.    No gallop.  Pulmonary:     Effort: Pulmonary effort is normal. No respiratory distress.     Breath sounds: Normal breath sounds. No wheezing, rhonchi or rales.     Comments: CTA B/L however with any exertion (I.e exam), started wheezing immediately- dislikes albuterol Abdominal:     General: Bowel sounds are normal.     Comments: Wound VAC in place with good seal. Minimal, thin cherry colored output Incision is vertical to umbilicus with VAC in place- appears to have good  granulating tissue underneath that can be seen Firm, rotund abd; NT; protuberant but not distended; normoactive BS  Genitourinary:    Comments: Male purewick in place Musculoskeletal:     Cervical back: Neck supple. No tenderness.     Comments: Ues 4th/5th digit of L hand flexed at MCP and PIP Ue's 5-/5 in arms except FA 4-/5 B/L   LE's HF 4-/5; KE 4+/5 B/L, DF 2-/5 and PF 2-/5 B/L B/L foot drop  Skin:    General: Skin is warm and dry.     Comments: trace bogginess of B/L heels- dressing on heels for prevention Needs PRAFO's. Healed stage 2 on right heel Has mild MASD between legs, skin folds and groin  Neurological:     Mental Status: He is alert and oriented to person, place, and time.     Sensory: Sensory deficit present.     Motor: Weakness present.     Comments: Severe neuropathy in legs B/L Has absent sensation to light touch to knees B/L- with foot atrophy B/L However still very decreased sensation to light touch from L1 to L2 B/L- absent L3-S2 B/L   Psychiatric:        Mood and Affect: Mood normal.        Behavior: Behavior normal. Bright and motivated        Assessment/Plan: 1. Functional deficits which require 3+ hours per day of interdisciplinary therapy in a comprehensive inpatient rehab setting. Physiatrist is providing close team supervision and 24 hour management of active medical problems listed below. Physiatrist and rehab team continue to assess barriers to discharge/monitor patient progress toward functional and medical goals  Care Tool:  Bathing    Body parts bathed by patient: Right arm, Left arm, Chest, Abdomen, Face   Body parts bathed by helper: Front perineal area, Buttocks, Right lower leg, Left lower leg     Bathing assist Assist Level: Maximal Assistance - Patient 24 - 49%     Upper Body Dressing/Undressing Upper body dressing   What is the patient wearing?: Pull over shirt    Upper body assist Assist Level: Minimal Assistance - Patient  > 75%    Lower Body Dressing/Undressing Lower body dressing      What is the patient wearing?: Underwear/pull up     Lower body assist Assist for lower body dressing: Maximal Assistance - Patient 25 - 49%     Toileting Toileting    Toileting assist Assist for toileting: Maximal Assistance - Patient 25 - 49%     Transfers Chair/bed transfer  Transfers assist     Chair/bed transfer assist level: Contact Guard/Touching assist     Locomotion Ambulation   Ambulation assist   Ambulation activity did not occur: Safety/medical concerns (fatigue, dizziness, weakness, decreased balance)  Assist level: 2 helpers Assistive device: Walker-rolling Max distance: 58fr   Walk 10 feet activity   Assist  Walk 10 feet activity did not  occur: Safety/medical concerns (fatigue, dizziness, weakness, decreased balance)  Assist level: 2 helpers Assistive device: Walker-rolling   Walk 50 feet activity   Assist Walk 50 feet with 2 turns activity did not occur: Safety/medical concerns (fatigue, dizziness, weakness, decreased balance)         Walk 150 feet activity   Assist Walk 150 feet activity did not occur: Safety/medical concerns (fatigue, dizziness, weakness, decreased balance)         Walk 10 feet on uneven surface  activity   Assist Walk 10 feet on uneven surfaces activity did not occur: Safety/medical concerns (fatigue, dizziness, weakness, decreased balance)         Wheelchair     Assist Is the patient using a wheelchair?: Yes Type of Wheelchair: Manual Wheelchair activity did not occur: Safety/medical concerns (fatigue, weakness)         Wheelchair 50 feet with 2 turns activity    Assist    Wheelchair 50 feet with 2 turns activity did not occur: Safety/medical concerns (fatigue, weakness)       Wheelchair 150 feet activity     Assist  Wheelchair 150 feet activity did not occur: Safety/medical concerns (fatigue, weakness)        Blood pressure (!) 107/58, pulse 73, temperature 98.3 F (36.8 C), resp. rate 16, height 5\' 8"  (1.727 m), weight (!) 159.7 kg, SpO2 94 %.  Medical Problem List and Plan: 1. Functional deficits secondary to debility from CHF exacerbation             -patient may not shower until wound VAC removed             -ELOS/Goals: 12-16 days -supervision to min A  Grounds pass ordered 2.  Antithrombotics: -DVT/anticoagulation:  Pharmaceutical: Heparin             -antiplatelet therapy: none   3. Pain Management: Tylenol, oxycodone 60 mg TID as needed- is home dose             -continue pregablin 100 mg daily- just restarted -see # 13   4. Mood/Behavior/Sleep: LCSW to evaluate and provide emotional support             -continue Cymbalta 60 mg BID             -antipsychotic agents: n/a   5. Neuropsych/cognition: This patient is capable of making decisions on his own behalf.   6. Skin/Wound Care: Routine skin care checks             -maintain wound VAC to midline incision; change M/W/F             -consult WOC RN   7. Fluids/Electrolytes/Nutrition: Strict Is and Os and follow-up chemistries             -continue Prosource, Boost, iron supplements   8: HFmidrangeEF:             -daily weight             -weight up>>resuming Lasix 80 mg daily on 1/16             -losartan 12.5 mg daily resumed 1/16   9: OSA on CPAP- uses home CPAP                10: Atrial fibrillation: persistent but alternating SR; s/p DCCV             -Eliquis held; general surgery to approve restarting- they said would determine 1/17?             -  continue Pacerone 400 mg daily, metoprolol 100 mg daily   11: Class 3 obesity: BMI 56.21 1/16   12: Hypertension: monitor TID and prn             -continue Lasix 80 mg daily             -continue losartan 12.5 mg daily   13: Peripheral neuropathy/lumbar spinal stenosis/chronic pain             -neurologist Dr. Shon Millet -oxycodone 60 mg TID, lorazepam 0.5 mg,  pregabalin 100 mg at home per Dr. Donzetta Sprung   14: GERD/dyspepsia: Protonix 40 mg q HS             -give Tums before meals   15: BPH: increase Flomax to home dose>> 0.8 mg daily   16: COPD (previous tobacco use): continue Yupelri nebs daily- wheezed at rest with exam exertion- pt refuses Albuterol per chart.    17: Leukocytosis: CT abdomen/pelvis ordered   18: Loose stool: continue probiotics   19. B/L Foot drop due to severe peripheral neuropathy- has AFO's but needs PRAFOs at bedtime- will order.    20. MASD- at groin and skin folds/between legs- continue Nystatin, add Gerrhard's butt cream  21. Left shoulder pain: XR ordered and shows severe glenohumeral arthritis. Kpad ordered.   22. Dry skin: Eucerin ordered  23. Urinary urgency: purewick ordered for night, discussed with patient that he can stop using this when he feels ready.   24. Mild ileus: nausea resolved, advance to regular diet  LOS: 3 days A FACE TO FACE EVALUATION WAS PERFORMED  Timothy Macias 01/11/2023, 2:19 PM

## 2023-01-11 NOTE — Consult Note (Signed)
[facility] [facility] Nurse wound follow up Wound type: surgical  Seen with PA Barkley Boards today, she is monitoring the deepest portion and the proximal aspect for formation of a false bottom and to monitor the wound base at the fascial level. Measurement: see WEDs note Wound bed: clean, visible sutures at the proximal aspect, moist subcutaneous tissue  Drainage (amount, consistency, odor) serosanguinous; pooling in distal aspect  Periwound: intact Dressing procedure/placement/frequency: old NPWT dressing removed by surgical PA, WOC not in the room Filled wound with  _3__ piece of black foam  Sealed NPWT dressing at 117mm HG Patient received PO pain medication per bedside nurse prior to dressing change Patient tolerated procedure well  [facility] nurse will continue to provide NPWT dressing changed due to the complexity of the dressing change.   CCS PA would like to see patient's wound Monday 1/22 with [facility] nursing team to reevaluate base of wound.  [place], [place], [place]

## 2023-01-11 NOTE — Progress Notes (Addendum)
Progress Note     Subjective: Overall feeling well this AM. Has some reflux but this is improved with positional changes. Passing flatus but no BM yesterday. Denies n/v. VAC changed at bedside with WOC RN.  He reports some concerns about chronic heel wounds - hx of osteo in R heel.   Objective: Vital signs in last 24 hours: Temp:  [97.7 F (36.5 C)-98.3 F (36.8 C)] 98.3 F (36.8 C) (01/19 0458) Pulse Rate:  [73-85] 73 (01/19 0458) Resp:  [15-18] 16 (01/19 0458) BP: (102-114)/(58-77) 107/58 (01/19 0458) SpO2:  [94 %-95 %] 94 % (01/19 0458) Weight:  [159.7 kg] 159.7 kg (01/19 0700) Last BM Date : 01/10/23  Intake/Output from previous day: 01/18 0701 - 01/19 0700 In: 600 [P.O.:600] Out: 1850 [Urine:1525; Drains:325] Intake/Output this shift: No intake/output data recorded.  PE: General: pleasant, WD, obese male who is laying in bed in NAD Heart: regular, rate, and rhythm.  Lungs: Respiratory effort nonlabored Abd: soft, appropriately ttp, midline wound with VAC present and SS fluid in canister  Psych: A&Ox3 with an appropriate affect.    Lab Results:  Recent Labs    01/09/23 0846 01/10/23 0607  WBC 18.2* 14.2*  HGB 15.2 13.8  HCT 47.1 42.1  PLT 332 270    BMET Recent Labs    01/09/23 0846  NA 132*  K 3.9  CL 96*  CO2 26  GLUCOSE 104*  BUN 21  CREATININE 1.26*  CALCIUM 8.6*    PT/INR No results for input(s): "LABPROT", "INR" in the last 72 hours. CMP     Component Value Date/Time   NA 132 (L) 01/09/2023 0846   K 3.9 01/09/2023 0846   CL 96 (L) 01/09/2023 0846   CO2 26 01/09/2023 0846   GLUCOSE 104 (H) 01/09/2023 0846   BUN 21 01/09/2023 0846   CREATININE 1.26 (H) 01/09/2023 0846   CALCIUM 8.6 (L) 01/09/2023 0846   PROT 6.5 01/09/2023 0846   ALBUMIN 3.1 (L) 01/09/2023 0846   AST 36 01/09/2023 0846   ALT 36 01/09/2023 0846   ALKPHOS 63 01/09/2023 0846   BILITOT 1.0 01/09/2023 0846   GFRNONAA >60 01/09/2023 0846   Lipase  No results  found for: "LIPASE"     Studies/Results: DG Shoulder Left  Result Date: 01/10/2023 CLINICAL DATA:  Left shoulder pain. EXAM: LEFT SHOULDER - 2+ VIEW COMPARISON:  None Available. FINDINGS: No evidence for an acute fracture. No subluxation or dislocation. No shoulder separation. Advanced degenerative changes are noted in the glenohumeral joint. IMPRESSION: Advanced degenerative changes in the glenohumeral joint. No acute bony abnormality. Electronically Signed   By: Kennith Center M.D.   On: 01/10/2023 15:57   CT HEAD WO CONTRAST ( )  Result Date: 01/10/2023 CLINICAL DATA:  Provided history: Neuro deficit, acute, stroke suspected. Facial paralysis/weakness (cranial nerve 7). Facial droop. EXAM: CT HEAD WITHOUT CONTRAST TECHNIQUE: Contiguous axial images were obtained from the base of the skull through the vertex without intravenous contrast. RADIATION DOSE REDUCTION: This exam was performed according to the departmental dose-optimization program which includes automated exposure control, adjustment of the mA and/or kV according to patient size and/or use of iterative reconstruction technique. COMPARISON:  Head CT 12/30/2022. Brain MRI 07/02/2019. FINDINGS: Brain: No age advanced or lobar predominant parenchymal atrophy. Nonspecific chronic cerebral white matter disease, better appreciated on the prior brain MRI of 07/02/2019. Partially empty sella turcica. There is no acute intracranial hemorrhage. No demarcated cortical infarct. No extra-axial fluid collection. No evidence of an intracranial  mass. No midline shift. Vascular: No hyperdense vessel. Atherosclerotic calcifications. Skull: No fracture or aggressive osseous lesion. Sinuses/Orbits: No mass or acute finding within the imaged orbits. No significant paranasal sinus disease at the imaged levels. IMPRESSION: 1. No evidence of acute intracranial abnormality. 2. Nonspecific chronic cerebral white matter disease, better appreciated on the prior brain  MRI of 07/02/2019. 3. Redemonstrated partially empty sella turcica. This finding can reflect incidental anatomic variation, or alternatively, can be associated with idiopathic intracranial hypertension (pseudotumor cerebri). Electronically Signed   By: Kellie Simmering D.O.   On: 01/10/2023 10:42   CT ABDOMEN PELVIS W CONTRAST  Result Date: 01/09/2023 CLINICAL DATA:  Postoperative abdominal pain, recent exploratory laparotomy EXAM: CT ABDOMEN AND PELVIS WITH CONTRAST TECHNIQUE: Multidetector CT imaging of the abdomen and pelvis was performed using the standard protocol following bolus administration of intravenous contrast. RADIATION DOSE REDUCTION: This exam was performed according to the departmental dose-optimization program which includes automated exposure control, adjustment of the mA and/or kV according to patient size and/or use of iterative reconstruction technique. CONTRAST:  42mL OMNIPAQUE IOHEXOL 350 MG/ML SOLN COMPARISON:  12/30/2022, 01/02/2023 FINDINGS: Lower chest: Hypoventilatory changes at the lung bases, left greater than right. Hepatobiliary: No focal liver abnormality is seen. No gallstones, gallbladder wall thickening, or biliary dilatation. Pancreas: Unremarkable. No pancreatic ductal dilatation or surrounding inflammatory changes. Spleen: Normal in size without focal abnormality. Adrenals/Urinary Tract: Stable 2.2 cm right adrenal nodule containing macroscopic fat consistent with myelolipoma. The left adrenal is unremarkable. There are numerous bilateral renal cortical cysts which do not require follow-up. Bilateral nephrolithiasis again noted. There has been repositioning of a left lower pole renal calyceal calculus into the left renal pelvis since prior study, measuring 7 mm. No hydronephrosis. No ureteral or bladder calculi. Evaluation of the bladder is limited by streak artifact from right hip arthroplasty. Stomach/Bowel: Interval removal of the enteric catheter, with marked gastric  distension. Postsurgical changes from small bowel resection with reanastomosis in the central abdomen. No evidence of high-grade bowel obstruction. Segmental dilation of loops of jejunum within the left mid abdomen measuring up to 5.1 cm with associated gas fluid levels may reflect segmental ileus. Gas and stool are seen throughout the bowel to the level of the distal colon. No bowel wall thickening or inflammatory change. Vascular/Lymphatic: No significant vascular findings are present. No enlarged abdominal or pelvic lymph nodes. Reproductive: Prostate is unremarkable. Other: There is no free fluid or free intraperitoneal gas. Postsurgical changes are seen from midline laparotomy, with wound VAC device in place. Musculoskeletal: Right hip arthroplasty. No acute or destructive bony lesions. IMPRESSION: 1. Postsurgical changes from midline laparotomy and small-bowel resection and reanastomosis. 2. Mild distension of proximal loops of jejunum with associated gas fluid levels, favor segmental ileus. There is no evidence of high-grade obstruction, with bowel gas and stool seen to the level of the distal colon. 3. Marked gastric distension. Interval removal of the enteric catheter. 4. Bilateral nonobstructing renal calculi as above. A lower pole left renal calculus has shifted position, and is now located within the left renal pelvis near the UPJ. No evidence of hydronephrosis. Please correlate with site of patient's pain. 5. Bibasilar hypoventilatory changes. 6. 2.2 cm right adrenal myelolipoma. Electronically Signed   By: Randa Ngo M.D.   On: 01/09/2023 16:50    Anti-infectives: Anti-infectives (From admission, onward)    None        Assessment/Plan  POD9, s/p ex lap with LOA and SBR for SBO, Dr. Donne Hazel 1/10 -  advance back to soft diet - continue VAC and change M/W/F - will see Monday   - mobilize with nursing and PT - IS, pulm toilet - continue chronic home pain meds  - CT 1/17 without  significant post-op complication  - bowel regimen    FEN - soft diet  VTE - SQH, eliquis ID - completed 24 hrs post op abx   - below per rehab MD -  Afib T. CHF HTN OSA NASH Acute on CKD  Morbid obesity  Dysuria - UCx pending  Heel wounds   LOS: 3 days    Norm Parcel, [facility] 01/11/2023, 9:37 AM Please see Amion for pager number during day hours 7:00am-4:30pm

## 2023-01-11 NOTE — Progress Notes (Signed)
Physical Therapy Session Note  Patient Details  Name: Ezrah Panning MRN: [id number] Date of Birth: [birth year]  Today's Date: 01/11/2023 PT Individual Time: 5397-6734 PT Individual Time Calculation (min): 73 min   Short Term Goals: Week 1:  PT Short Term Goal 1 (Week 1): pt will perform bed mobility with min A PT Short Term Goal 2 (Week 1): pt will transfer sit<>stand with LRAD and min A PT Short Term Goal 3 (Week 1): pt will ambulate 64ft with LRAD and min A  Skilled Therapeutic Interventions/Progress Updates:   Received pt semi-reclined in bed, pt agreeable to PT treatment, and reported pain 9/10 in abdomen (premedicated). Recreational therapy present during session. Session with emphasis on functional mobility/transfers, generalized strengthening and endurance, dynamic standing balance/coordination, and gait training. Pt transferred semi-reclined<>sitting R EOB with HOB elevated and use of bedrails with min A and donned bilateral AFO's with total A. Pt transferred sit<>stand from elevated EOB with bari RW and light min A and transferred into WC via stand<>pivot with CGA. Pt transported to/from room in [facility] dependently for time management purposes.   Stood with bari RW and CGA (pt prefers to pull up on RW) and pt ambulated 29ft x 1 and 59ft x 1 with bari RW and CGA with +2 for WC follow and equipment management. Pt stopped due to reports of dizziness/lightheadedness. Pt then stood x 2 additional trials with bari RW and CGA and worked on Tour manager with RUE and CGA for balance - pt able to throw 4 horseshoes prior to needing to sit due to increased dizziness - see vitals below  BP sitting: 105/78 O2 94% and HR 84bpm - pt symptomatic  BP sitting: 82/70 O2 95% and HR 92bpm - pt symptomatic (pt unable to remain standing long enough to obtain standing BP) BP sitting after 5 minutes: 80/56 - unsure of accuracy of cuff, therefore tried larger cuff but still not  accurate.   Pt required multiple extended rest breaks due to fatigue and dizziness/lightheadedness and pt becoming slightly pale - improved with prolonged rest. Also noted spontaneous tremors in LUE while holding drink - pt reports this is baseline. Returned to room and concluded session with pt sitting in WC, needs within reach, and seatbelt alarm on.   Therapy Documentation Precautions:  Precautions Precautions: Fall, Other (comment) Precaution Comments: abdominal wound, bilateral AFOs Restrictions Weight Bearing Restrictions: No  Therapy/Group: Individual Therapy Alfonse Alpers PT, DPT  01/11/2023, 7:11 AM

## 2023-01-11 NOTE — IPOC Note (Signed)
Overall Plan of Care [facility]) Patient Details Name: Timothy Macias MRN: [id number] DOB: 04/26/1961  Admitting Diagnosis: [facility] Problems: Principal Problem:   Debility Active Problems:   Small bowel obstruction (HCC)   Acute on chronic congestive heart failure (HCC)     Functional Problem List: Nursing Safety, Bowel, Bladder, Pain, Edema, Endurance, Medication Management, Skin Integrity  PT Balance, Edema, Endurance, Motor, Nutrition, Pain, Perception, Sensory, Skin Integrity  OT Balance, Edema, Endurance, Motor, Pain, Sensory  SLP    TR         Basic ADL's: OT Bathing, Dressing, Toileting     Advanced  ADL's: OT Simple Meal Preparation     Transfers: PT Bed Mobility, Bed to Chair, Car, Manufacturing systems engineer, Metallurgist: PT Ambulation, Emergency planning/management officer, Stairs     Additional Impairments: OT Fuctional Use of Upper Extremity (LUE)  SLP        TR      Anticipated Outcomes Item Anticipated Outcome  Self Feeding Mod I  Swallowing      Basic self-care  Supervision, min A  Toileting  Supervision   Bathroom Transfers Supervision  Bowel/Bladder  manage bowel w mod I and bladder w mod I assist/toileting  Transfers  supervision with LRAD  Locomotion  supervision with LRAD  Communication     Cognition     Pain  < 4 with scheduled and prn meds  Safety/Judgment  manage w cues   Therapy Plan: PT Intensity: Minimum of 1-2 x/day ,45 to 90 minutes PT Frequency: 5 out of 7 days PT Duration Estimated Length of Stay: 2 weeks OT Intensity: Minimum of 1-2 x/day, 45 to 90 minutes OT Frequency: 5 out of 7 days OT Duration/Estimated Length of Stay: 2 weeks     Team Interventions: Nursing Interventions Medication Management, Bowel Management, Skin Care/Wound Management, Pain Management, Patient/Family Education, Discharge Planning, Bladder Management, Disease Management/Prevention  PT interventions Ambulation/gait training, Community  reintegration, DME/adaptive equipment instruction, Neuromuscular re-education, Psychosocial support, UE/LE Strength taining/ROM, Stair training, Wheelchair propulsion/positioning, Training and development officer, Discharge planning, Functional electrical stimulation, Pain management, Skin care/wound management, Therapeutic Activities, UE/LE Coordination activities, Cognitive remediation/compensation, Disease management/prevention, Functional mobility training, Patient/family education, Therapeutic Exercise, Visual/perceptual remediation/compensation  OT Interventions Balance/vestibular training, Discharge planning, Pain management, Self Care/advanced ADL retraining, Therapeutic Activities, UE/LE Coordination activities, Disease mangement/prevention, Functional mobility training, Patient/family education, Skin care/wound managment, Therapeutic Exercise, DME/adaptive equipment instruction, Psychosocial support, Neuromuscular re-education, UE/LE Strength taining/ROM, Wheelchair propulsion/positioning  SLP Interventions    TR Interventions    SW/CM Interventions Discharge Planning, Psychosocial Support, Patient/Family Education, Disease Management/Prevention   Barriers to Discharge MD  Medical stability  Nursing Wound Care, Decreased caregiver support 1 level ramped entry w spouse;walks with bari RW, wife assists with bed mobility, uses WC outside of house  ADLs Comments: wife assists with bathing and LB dressing, has RW, SS, Rollator, SPC and W/C  PT Decreased caregiver support, Incontinence, Wound Care, Weight, Other (comments) neuropathy requiring bilateral AFO's, body habitus, wound care  OT Incontinence, Wound Care, Lack of/limited family support, Weight    SLP      SW       Team Discharge Planning: Destination: PT-Home ,OT- Home , SLP-  Projected Follow-up: PT-Home health PT, OT-  Home health OT, 24 hour supervision/assistance, SLP-  Projected Equipment Needs: PT-To be determined, OT- To be  determined, SLP-  Equipment Details: PT- , OT-  Patient/family involved in discharge planning: PT- Patient,  OT-Patient, SLP-   MD ELOS: 15  days Medical Rehab Prognosis:  Excellent Assessment: The patient has been admitted for CIR therapies with the diagnosis of debility. The team will be addressing functional mobility, strength, stamina, balance, safety, adaptive techniques and equipment, self-care, bowel and bladder mgt, patient and caregiver education. Goals have been set at [place]. Anticipated discharge destination is home.        See Team Conference Notes for weekly updates to the plan of care

## 2023-01-11 NOTE — Progress Notes (Signed)
Occupational Therapy Session Note  Patient Details  Name: Timothy Macias MRN: [id number] Date of Birth: August 22, 1961  Today's Date: 01/11/2023 OT Individual Time: 0932-6712 OT Individual Time Calculation (min): 71 min    Short Term Goals: Week 1:  OT Short Term Goal 1 (Week 1): Pt will complete sit > stand in prep for ADL with Min A using LRAD OT Short Term Goal 2 (Week 1): Pt will complete toileting with min A using LRAD OT Short Term Goal 3 (Week 1): Pt will complete toilet transfer with CGA using LRAD  Skilled Therapeutic Interventions/Progress Updates:  Skilled OT intervention completed with focus on fine motor strength, . Pt received seated in w/c, agreeable to session. 9/10 pain reported in abdomen; pre-medicated. OT offered rest breaks, distraction and repositioning for pain reduction throughout.  Pt reported fatigue from earlier session but agreeable to start with hand/arm exercises. Transported dependently in w/c <> gym for time and energy conservation.  Seated at table top, pt attempted to participate in gripper tongs with resistance with large peg board activity however pt unable to tolerate lowest setting therefore transitioned to resistive clips for pinch strength vs hand/grasp. Pt only able to achieve the red clip setting due to weakness, but utilized it for large wooden pegs to be transferred table <> peg board (R then L hand). Greater difficulty with L hand demonstrated and a few instances on both of inability to sustain the pinch during transfer.   Transitioned to AAROM/light PROM to improve LUE functional use and ROM within a pain free zone. Per x-ray, only degenerative changes noted vs injury. Completed the following with PVC pipe (3x10): shoulder flexion, shoulder external rotation, shoulder abduction.  With 3 pound dowel (2x12)- chest presses, bicep flexion   Back in room, pt remained seated in w/c, with chair pad alarm on/activated, and with all needs in reach at end of  session.   Therapy Documentation Precautions:  Precautions Precautions: Fall, Other (comment) Precaution Comments: abdominal wound, bilateral AFOs Restrictions Weight Bearing Restrictions: No    Therapy/Group: Individual Therapy  [name], MS, OTR/L  01/11/2023, 2:56 PM

## 2023-01-12 DIAGNOSIS — R5381 Other malaise: Secondary | ICD-10-CM | POA: Diagnosis not present

## 2023-01-12 LAB — HOMOCYSTEINE: Homocysteine: 21.9 umol/L — ABNORMAL HIGH (ref 0.0–17.2)

## 2023-01-12 MED ORDER — OXYCODONE HCL 5 MG PO TABS
60.0000 mg | ORAL_TABLET | Freq: Three times a day (TID) | ORAL | Status: DC
  Administered 2023-01-12 – 2023-01-13 (×5): 60 mg via ORAL
  Administered 2023-01-14 (×2): 30 mg via ORAL
  Administered 2023-01-14 – 2023-01-19 (×15): 60 mg via ORAL
  Filled 2023-01-12 (×22): qty 12

## 2023-01-12 MED ORDER — SORBITOL 70 % SOLN
30.0000 mL | Freq: Once | Status: AC
  Administered 2023-01-12: 30 mL via ORAL
  Filled 2023-01-12: qty 30

## 2023-01-12 NOTE — Progress Notes (Signed)
PROGRESS NOTE   Subjective/Complaints:  Pt reports doing so-so-  Main issue is prn pain meds- abd pain is resolved- however still no BM since 1/17  Timothy Macias has to ask for pain meds, and is usually missing 1 dose/day as a result and making him feel like going into withdrawal every time misses one.   They tell him he didn't ask, even though he said he thinks he does.   ROS:   Pt denies SOB, abd pain, CP, N/V/C/D, and vision changes Except for HPI Objective:   DG Shoulder Left  Result Date: 01/10/2023 CLINICAL DATA:  Left shoulder pain. EXAM: LEFT SHOULDER - 2+ VIEW COMPARISON:  None Available. FINDINGS: No evidence for an acute fracture. No subluxation or dislocation. No shoulder separation. Advanced degenerative changes are noted in the glenohumeral joint. IMPRESSION: Advanced degenerative changes in the glenohumeral joint. No acute bony abnormality. Electronically Signed   By: Misty Stanley M.D.   On: 01/10/2023 15:57   Recent Labs    01/10/23 0607  WBC 14.2*  HGB 13.8  HCT 42.1  PLT 270   No results for input(s): "NA", "K", "CL", "CO2", "GLUCOSE", "BUN", "CREATININE", "CALCIUM" in the last 72 hours.   Intake/Output Summary (Last 24 hours) at 01/12/2023 1510 Last data filed at 01/12/2023 0900 Gross per 24 hour  Intake 480 ml  Output 700 ml  Net -220 ml        Physical Exam: Vital Signs Blood pressure (!) 95/57, pulse 77, temperature 97.8 F (36.6 C), temperature source Oral, resp. rate 18, height 5\' 8"  (1.727 m), weight (!) 160.3 kg, SpO2 96 %.  Blood pressure 134/78, pulse 76, temperature 98 F (36.7 C), temperature source Oral, resp. rate (!) 25, height 5\' 8"  (1.727 m), weight (!) 167.7 kg, SpO2 95 %. Physical Exam   General: awake, alert, appropriate, sitting up in bed; NAD HENT: conjugate gaze; oropharynx moist CV: regular rate; no JVD Pulmonary: CTA B/L; no W/R/R- good air movement GI: soft, NT,  protuberant at baseline; hypoactive BS Psychiatric: appropriate; interactive  Neurological: Ox3 Skin: wound VAC on abd- vertical - looks stable Musculoskeletal:     Cervical back: Neck supple. No tenderness.     Comments: Ues 4th/5th digit of L hand flexed at MCP and PIP Ue's 5-/5 in arms except FA 4-/5 B/L   LE's HF 4-/5; KE 4+/5 B/L, DF 2-/5 and PF 2-/5 B/L B/L foot drop  Skin:    General: Skin is warm and dry.     Comments: trace bogginess of B/L heels- dressing on heels for prevention Needs PRAFO's. Healed stage 2 on right heel Has mild MASD between legs, skin folds and groin  Neurological:     Mental Status: He is alert and oriented to person, place, and time.     Sensory: Sensory deficit present.     Motor: Weakness present.     Comments: Severe neuropathy in legs B/L Has absent sensation to light touch to knees B/L- with foot atrophy B/L However still very decreased sensation to light touch from L1 to L2 B/L- absent L3-S2 B/L   Psychiatric:        Mood and Affect: Mood normal.  Behavior: Behavior normal. Bright and motivated        Assessment/Plan: 1. Functional deficits which require 3+ hours per day of interdisciplinary therapy in a comprehensive inpatient rehab setting. Physiatrist is providing close team supervision and 24 hour management of active medical problems listed below. Physiatrist and rehab team continue to assess barriers to discharge/monitor patient progress toward functional and medical goals  Care Tool:  Bathing    Body parts bathed by patient: Right arm, Left arm, Chest, Abdomen, Face   Body parts bathed by helper: Front perineal area, Buttocks, Right lower leg, Left lower leg     Bathing assist Assist Level: Maximal Assistance - Patient 24 - 49%     Upper Body Dressing/Undressing Upper body dressing   What is the patient wearing?: Pull over shirt    Upper body assist Assist Level: Minimal Assistance - Patient > 75%    Lower Body  Dressing/Undressing Lower body dressing      What is the patient wearing?: Underwear/pull up     Lower body assist Assist for lower body dressing: Maximal Assistance - Patient 25 - 49%     Toileting Toileting    Toileting assist Assist for toileting: Maximal Assistance - Patient 25 - 49%     Transfers Chair/bed transfer  Transfers assist     Chair/bed transfer assist level: Contact Guard/Touching assist     Locomotion Ambulation   Ambulation assist   Ambulation activity did not occur: Safety/medical concerns (fatigue, dizziness, weakness, decreased balance)  Assist level: 2 helpers Assistive device: Walker-rolling Max distance: 67fr   Walk 10 feet activity   Assist  Walk 10 feet activity did not occur: Safety/medical concerns (fatigue, dizziness, weakness, decreased balance)  Assist level: 2 helpers Assistive device: Walker-rolling   Walk 50 feet activity   Assist Walk 50 feet with 2 turns activity did not occur: Safety/medical concerns (fatigue, dizziness, weakness, decreased balance)         Walk 150 feet activity   Assist Walk 150 feet activity did not occur: Safety/medical concerns (fatigue, dizziness, weakness, decreased balance)         Walk 10 feet on uneven surface  activity   Assist Walk 10 feet on uneven surfaces activity did not occur: Safety/medical concerns (fatigue, dizziness, weakness, decreased balance)         Wheelchair     Assist Is the patient using a wheelchair?: Yes Type of Wheelchair: Manual Wheelchair activity did not occur: Safety/medical concerns (fatigue, weakness)         Wheelchair 50 feet with 2 turns activity    Assist    Wheelchair 50 feet with 2 turns activity did not occur: Safety/medical concerns (fatigue, weakness)       Wheelchair 150 feet activity     Assist  Wheelchair 150 feet activity did not occur: Safety/medical concerns (fatigue, weakness)       Blood pressure (!)  95/57, pulse 77, temperature 97.8 F (36.6 C), temperature source Oral, resp. rate 18, height 5\' 8"  (1.727 m), weight (!) 160.3 kg, SpO2 96 %.  Medical Problem List and Plan: 1. Functional deficits secondary to debility from CHF exacerbation             -patient may not shower until wound VAC removed             -ELOS/Goals: 12-16 days -supervision to min A  Grounds pass ordered  Con't CIR- PT and OT 2.  Antithrombotics: -DVT/anticoagulation:  Pharmaceutical: Heparin             -  antiplatelet therapy: none   3. Pain Management: Tylenol, oxycodone 60 mg TID as needed- is home dose             -continue pregablin 100 mg daily- just restarted -1/20- will change Oxycodone to 60 mg TID- since is home regimen-  4. Mood/Behavior/Sleep: LCSW to evaluate and provide emotional support             -continue Cymbalta 60 mg BID             -antipsychotic agents: n/a   5. Neuropsych/cognition: This patient is capable of making decisions on his own behalf.   6. Skin/Wound Care: Routine skin care checks             -maintain wound VAC to midline incision; change M/W/F             -consult WOC RN   7. Fluids/Electrolytes/Nutrition: Strict Is and Os and follow-up chemistries             -continue Prosource, Boost, iron supplements   8: HFmidrangeEF:             -daily weight             -weight up>>resuming Lasix 80 mg daily on 1/16             -losartan 12.5 mg daily resumed 1/16   1/20- weight stable- con't to monitor trend 9: OSA on CPAP- uses home CPAP                10: Atrial fibrillation: persistent but alternating SR; s/p DCCV             -Eliquis held; general surgery to approve restarting- they said would determine 1/17?             -continue Pacerone 400 mg daily, metoprolol 100 mg daily   11: Class 3 obesity: BMI 56.21 1/16   12: Hypertension: monitor TID and prn             -continue Lasix 80 mg daily             -continue losartan 12.5 mg daily   1/20- BP a little on low  side- /soft- doesn't feel orthostatic- con't to monitor- might need to decrease BP meds.  13: Peripheral neuropathy/lumbar spinal stenosis/chronic pain             -neurologist Dr. Shon Millet -oxycodone 60 mg TID, lorazepam 0.5 mg, pregabalin 100 mg at home per Dr. Donzetta Sprung   14: GERD/dyspepsia: Protonix 40 mg q HS             -give Tums before meals   15: BPH: increase Flomax to home dose>> 0.8 mg daily   16: COPD (previous tobacco use): continue Yupelri nebs daily- wheezed at rest with exam exertion- pt refuses Albuterol per chart.    17: Leukocytosis: CT abdomen/pelvis ordered   18: Loose stool: continue probiotics   1/20- will give Sorbitol- it's been 3 days since Last BM.    19. B/L Foot drop due to severe peripheral neuropathy- has AFO's but needs PRAFOs at bedtime- will order.    20. MASD- at groin and skin folds/between legs- continue Nystatin, add Gerrhard's butt cream  21. Left shoulder pain: XR ordered and shows severe glenohumeral arthritis. Kpad ordered.   22. Dry skin: Eucerin ordered  23. Urinary urgency: purewick ordered for night, discussed with patient that he can stop using this when he feels ready.  24. Mild ileus: nausea resolved, advance to regular diet  1/20- no BM in 3 days- will give Sorbitol.    I spent a total of  36  minutes on total care today- >50% coordination of care- due to  Due to d/w nursing about bowel meds and pain meds- also with pt.   LOS: 4 days A FACE TO FACE EVALUATION WAS PERFORMED  Timothy Macias 01/12/2023, 3:10 PM

## 2023-01-13 DIAGNOSIS — R5381 Other malaise: Secondary | ICD-10-CM | POA: Diagnosis not present

## 2023-01-13 NOTE — Progress Notes (Signed)
Patient has home unit CPAP at bedside, water chamber refilled and patient states will place on self when ready.

## 2023-01-13 NOTE — Progress Notes (Signed)
Occupational Therapy Session Note  Patient Details  Name: Timothy Macias MRN: [id number] Date of Birth: 1961/07/21  Today's Date: 01/13/2023 OT Individual Time: 9379-0240 session 1 OT Individual Time Calculation (min): 51 min  Session 2: 1103-1200   Short Term Goals: Week 1:  OT Short Term Goal 1 (Week 1): Pt will complete sit > stand in prep for ADL with Min A using LRAD OT Short Term Goal 2 (Week 1): Pt will complete toileting with min A using LRAD OT Short Term Goal 3 (Week 1): Pt will complete toilet transfer with CGA using LRAD  Skilled Therapeutic Interventions/Progress Updates:  Session 1: Pt greeted seated in w/c, pt agreeable to OT intervention. Session focus on BADL reeducation, functional mobility, dynamic standing balance and decreasing overall caregiver burden.             Pt reports need to void b/b, total A transport into bathroom where pt completed stand pivot to bari [facility] over toilet with RW and CGA. Pt needed total A to set up urinal for bladder void d/t body habitus. Pt with + BM. Assessed BP from toilet d/t reports of dizziness, 112/74( 81) HR 80           Discussed various options for toilteing hygiene, pt reports he uses a toileting aid at home that he uses from sitting therefore pt required total A for pericare and to don new brief as pt uses reacher at home to don brief or asks his wife to assist. Total A transport out of bathroom where pt completed seated groomign tasks from w/c MODI.   Ended session with pt seated in w/c with all needs within reach and nurse present.                 Session 2: Pt greeted seated in w/c, pt agreeable to OT intervention. Session focus on hand eye coordination tasks, BUE FMC,and  global endurance. Total A transport to gym in w/c for time mgmt and energy conservation.   Utilized BITS to work on hand eye coordination with pt first instructed to reach out of BOS to screen to tap stimulus with BUEs to assess motor planning and  coordination.               Accuracy score- 80%, 1.45 sec reaction time, 10 misses Pt reports baseline Double vision in L eye, pt reports no pattern to diplopia.   Pt then instructed to reach to screen to sequence letters A-Z using  RUE ( dominant UE).  Pt completed task with 96% accuracy ( seems to do better with RUE only)  2.66 sec reaction, 1 min 9 secs, only 1 miss   Utilized Geoboards to work on Lexicographer with RUE with pt shown pattern on geoboard on L side and pt instructed to duplicate pattern on R side, no score given for this task but pt did well with motor planning and sequencing of task, no deficits noted in coordination.   Graded task up and had pt Trace shapes with RUE with tp able to complete task with  83.9% coverage   Pt also completed seated Visual motor task where pt to sequence numbers 1-30 with numbers rotating in circular pattern, mild tremors noted during this task but able to complete task with 100% accuracy, 67min 52 secs, 3.75 sec reaction time  Additionally worked on seated [facility] task with pt engaging in [facility] task of playing "go fish" with 2 lb wrist weights donned, noted deficits in [facility] however  pt with neuropathy noted to have fair strategies for compensating for deficits. Pt able to don/doff weighted clothes pins from bar of all levels to facilitate improved grip strength for ADL participation from sitting.   Worked on general endurance with pt engaging in 3 mins of continuous UE therex with pt using 5 lb dowel rod to toss ball back and forth, pt needed 1 min break at 1 min mark d/t fatigue.   Ended session with pt seated in w/c with all needs within reach.             Therapy Documentation Precautions:  Precautions Precautions: Fall, Other (comment) Precaution Comments: abdominal wound, bilateral AFOs Restrictions Weight Bearing Restrictions: No  Pain: Session 1: 10/10 pain reported in buttock and stomach, pain meds provided during session  Session 2: 9/10  pain reported in buttock, pain meds already given, provided rest breaks as needed.   Therapy/Group: Individual Therapy  Corinne Ports [facility] 01/13/2023, 12:08 PM

## 2023-01-13 NOTE — Progress Notes (Signed)
PROGRESS NOTE   Subjective/Complaints:  No issues, tolerating therapy without SOB or fatigue, pain control is ok  Moved his bowels yesterday   ROS:   Pt denies SOB, abd pain, CP, N/V/C/D, and vision changes Except for HPI Objective:   No results found. No results for input(s): "WBC", "HGB", "HCT", "PLT" in the last 72 hours.  No results for input(s): "NA", "K", "CL", "CO2", "GLUCOSE", "BUN", "CREATININE", "CALCIUM" in the last 72 hours.   Intake/Output Summary (Last 24 hours) at 01/13/2023 1141 Last data filed at 01/13/2023 0930 Gross per 24 hour  Intake 720 ml  Output 1290 ml  Net -570 ml         Physical Exam: Vital Signs Blood pressure 112/74, pulse 70, temperature 98.2 F (36.8 C), temperature source Oral, resp. rate 17, height 5\' 8"  (1.727 m), weight (!) 161.3 kg, SpO2 95 %.  Blood pressure 134/78, pulse 76, temperature 98 F (36.7 C), temperature source Oral, resp. rate (!) 25, height 5\' 8"  (1.727 m), weight (!) 167.7 kg, SpO2 95 %. Physical Exam  General: No acute distress Mood and affect are appropriate Heart: Regular rate and rhythm no rubs murmurs or extra sounds Lungs: Clear to auscultation, breathing unlabored, no rales or wheezes Abdomen: Positive bowel sounds, soft nontender to palpation, nondistended  Skin: wound VAC on abd- vertical - looks stable- has good suction  Musculoskeletal:     Cervical back: Neck supple. No tenderness.     Comments: Ues 4th/5th digit of L hand flexed at MCP and PIP Ue's 5-/5 in arms except FA 4-/5 B/L   LE's HF 4-/5; KE 4+/5 B/L, DF 2-/5 and PF 2-/5 B/L B/L foot drop  Skin:    General: Skin is warm and dry.     Comments: trace bogginess of B/L heels- dressing on heels for prevention Needs PRAFO's. Healed stage 2 on right heel Has mild MASD between legs, skin folds and groin  Neurological:     Mental Status: He is alert and oriented to person, place, and time.      Sensory: Sensory deficit present.     Motor: Weakness present.     Comments: Severe neuropathy in legs B/L, finger tips also with reduced sensation  Has absent sensation to light touch to knees B/L- with foot atrophy B/L   Psychiatric:        Mood and Affect: Mood normal.        Behavior: Behavior normal. Bright and motivated        Assessment/Plan: 1. Functional deficits which require 3+ hours per day of interdisciplinary therapy in a comprehensive inpatient rehab setting. Physiatrist is providing close team supervision and 24 hour management of active medical problems listed below. Physiatrist and rehab team continue to assess barriers to discharge/monitor patient progress toward functional and medical goals  Care Tool:  Bathing    Body parts bathed by patient: Right arm, Left arm, Chest, Abdomen, Face   Body parts bathed by helper: Front perineal area, Buttocks, Right lower leg, Left lower leg     Bathing assist Assist Level: Maximal Assistance - Patient 24 - 49%     Upper Body Dressing/Undressing Upper body dressing  What is the patient wearing?: Pull over shirt    Upper body assist Assist Level: Minimal Assistance - Patient > 75%    Lower Body Dressing/Undressing Lower body dressing      What is the patient wearing?: Underwear/pull up     Lower body assist Assist for lower body dressing: Maximal Assistance - Patient 25 - 49%     Toileting Toileting    Toileting assist Assist for toileting: Maximal Assistance - Patient 25 - 49%     Transfers Chair/bed transfer  Transfers assist     Chair/bed transfer assist level: Contact Guard/Touching assist     Locomotion Ambulation   Ambulation assist   Ambulation activity did not occur: Safety/medical concerns (fatigue, dizziness, weakness, decreased balance)  Assist level: Minimal Assistance - Patient > 75% Assistive device: Walker-rolling Max distance: 18   Walk 10 feet activity   Assist   Walk 10 feet activity did not occur: Safety/medical concerns (fatigue, dizziness, weakness, decreased balance)  Assist level: Minimal Assistance - Patient > 75% Assistive device: Walker-rolling   Walk 50 feet activity   Assist Walk 50 feet with 2 turns activity did not occur: Safety/medical concerns (fatigue, dizziness, weakness, decreased balance)         Walk 150 feet activity   Assist Walk 150 feet activity did not occur: Safety/medical concerns (fatigue, dizziness, weakness, decreased balance)         Walk 10 feet on uneven surface  activity   Assist Walk 10 feet on uneven surfaces activity did not occur: Safety/medical concerns (fatigue, dizziness, weakness, decreased balance)         Wheelchair     Assist Is the patient using a wheelchair?: Yes Type of Wheelchair: Manual Wheelchair activity did not occur: Safety/medical concerns (fatigue, weakness)         Wheelchair 50 feet with 2 turns activity    Assist    Wheelchair 50 feet with 2 turns activity did not occur: Safety/medical concerns (fatigue, weakness)       Wheelchair 150 feet activity     Assist  Wheelchair 150 feet activity did not occur: Safety/medical concerns (fatigue, weakness)       Blood pressure 112/74, pulse 70, temperature 98.2 F (36.8 C), temperature source Oral, resp. rate 17, height 5\' 8"  (1.727 m), weight (!) 161.3 kg, SpO2 95 %.  Medical Problem List and Plan: 1. Functional deficits secondary to debility from CHF exacerbation             -patient may not shower until wound VAC removed             -ELOS/Goals: 12-16 days -supervision to min A  Grounds pass ordered  Con't CIR- PT and OT 2.  Antithrombotics: -DVT/anticoagulation:  Pharmaceutical: Heparin             -antiplatelet therapy: none   3. Pain Management: Severe lumbar spinal stenosis  Tylenol, oxycodone 60 mg TID as needed- is home dose             -continue pregablin 100 mg daily- just  restarted -1/20- will change Oxycodone to 60 mg TID- since is home regimen-  4. Mood/Behavior/Sleep: LCSW to evaluate and provide emotional support             -continue Cymbalta 60 mg BID             -antipsychotic agents: n/a   5. Neuropsych/cognition: This patient is capable of making decisions on his own behalf.   6.  Skin/Wound Care: Routine skin care checks             -maintain wound VAC to midline incision; change M/W/F             -consult [place] RN   7. Fluids/Electrolytes/Nutrition: Strict Is and Os and follow-up chemistries             -continue Prosource, Boost, iron supplements   8: HFmidrangeEF:             -daily weight             -weight up>>resuming Lasix 80 mg daily on 1/16             -losartan 12.5 mg daily resumed 1/16   1/20- weight stable- con't to monitor trend 9: OSA on CPAP- uses home CPAP                10: Atrial fibrillation: persistent but alternating SR; s/p DCCV             -Eliquis held; general surgery to approve restarting- they said would determine 1/17?             -continue Pacerone 400 mg daily, metoprolol 100 mg daily   11: Class 3 obesity: BMI 56.21 1/16   12: Hypertension: monitor TID and prn             -continue Lasix 80 mg daily             -continue losartan 12.5 mg daily   1/20- BP a little on low side- /soft- doesn't feel orthostatic- con't to monitor- might need to decrease BP meds.  13: Peripheral neuropathy/lumbar spinal stenosis/chronic pain             -neurologist Dr. Metta Clines -oxycodone 60 mg TID, lorazepam 0.5 mg, pregabalin 100 mg at home per Dr. Gar Ponto   14: GERD/dyspepsia: Protonix 40 mg q HS             -give Tums before meals   15: BPH: increase Flomax to home dose>> 0.8 mg daily   16: COPD (previous tobacco use): continue Yupelri nebs daily- wheezed at rest with exam exertion- pt refuses Albuterol per chart.    17: Leukocytosis: CT abdomen/pelvis ordered   18: Loose stool: continue probiotics   1/20-  will give Sorbitol- it's been 3 days since Last BM.    19. B/L Foot drop due to severe peripheral neuropathy- has AFO's but needs PRAFOs at bedtime- will order.    20. MASD- at groin and skin folds/between legs- continue Nystatin, add Gerrhard's butt cream  21. Left shoulder pain: XR ordered and shows severe glenohumeral arthritis. Kpad ordered.   22. Dry skin: Eucerin ordered  23. Urinary urgency: purewick ordered for night, discussed with patient that he can stop using this when he feels ready.   24. Mild ileus: nausea resolved, advance to regular diet  BM on 1/21     LOS: 5 days A FACE TO FACE EVALUATION WAS PERFORMED  Charlett Blake 01/13/2023, 11:41 AM

## 2023-01-13 NOTE — Progress Notes (Signed)
Physical Therapy Session Note  Patient Details  Name: Timothy Macias MRN: [id number] Date of Birth: 03-31-1961  Today's Date: 01/13/2023 PT Individual Time: 5102-5852 and 7782-4235 PT Individual Time Calculation (min): 45 min and 43 min  Short Term Goals: Week 1:  PT Short Term Goal 1 (Week 1): pt will perform bed mobility with min A PT Short Term Goal 2 (Week 1): pt will transfer sit<>stand with LRAD and min A PT Short Term Goal 3 (Week 1): pt will ambulate 40ft with LRAD and min A  Skilled Therapeutic Interventions/Progress Updates:   First session:  Pt presents propped up in bed and agreeable to therapy.  Pt lifted LES for threading pull-up over feet and up to posterior thighs.  Pt transferred sup to sit via log roll w/ min A but cues for breathing.  Pt states no dizziness w/ activity, although does state "some all the time."  Pt transfers sit to stand from elevated bed and min A.  Pt required total A to complete pull up.  Pt amb x 3' to w/c w/ min A.  Pt wheeled to dayroom for time conservation, pt c/o pain/weakness to B wrists.  Pt amb x 10' and 18' w/ sit to stand from w/c w/ min A (split hand set-up) and min A from elevated mat table, B hands on bari RW.  Pulled up pull-up and noted smear of blood, RN notified.  Pt c/o dizziness,  BP measured at 122/77 after amb.  Pt returned to room and remained sitting in w/c w/ all needs in reach.     Second session:  Pt presents sitting in w/c and agrees to therapy, but very fatigued and unable to perform standing/gait activities.  Pt transfers sit to stand w/ CGA and then steps to mat table w/ RW and CGA.  Pt performed sitting reaching and throwing bean bags including crossing midline.  Pt shooting baskets w/ small basketball w/o LE support.  Pt returned to room and remained sitting in w/c w/ all needs in reach.     Therapy Documentation Precautions:  Precautions Precautions: Fall, Other (comment) Precaution Comments: abdominal wound, bilateral  AFOs Restrictions Weight Bearing Restrictions: No General:   Vital Signs:  Pain:8/10 generalizeed.      Therapy/Group: Individual Therapy  Ladoris Gene 01/13/2023, 8:46 AM

## 2023-01-14 DIAGNOSIS — R5381 Other malaise: Secondary | ICD-10-CM | POA: Diagnosis not present

## 2023-01-14 LAB — CBC
HCT: 41.5 % (ref 39.0–52.0)
Hemoglobin: 12.9 g/dL — ABNORMAL LOW (ref 13.0–17.0)
MCH: 26 pg (ref 26.0–34.0)
MCHC: 31.1 g/dL (ref 30.0–36.0)
MCV: 83.7 fL (ref 80.0–100.0)
Platelets: 346 10*3/uL (ref 150–400)
RBC: 4.96 MIL/uL (ref 4.22–5.81)
RDW: 18.6 % — ABNORMAL HIGH (ref 11.5–15.5)
WBC: 9.8 10*3/uL (ref 4.0–10.5)
nRBC: 0 % (ref 0.0–0.2)

## 2023-01-14 LAB — BASIC METABOLIC PANEL
Anion gap: 9 (ref 5–15)
BUN: 28 mg/dL — ABNORMAL HIGH (ref 8–23)
CO2: 29 mmol/L (ref 22–32)
Calcium: 8.4 mg/dL — ABNORMAL LOW (ref 8.9–10.3)
Chloride: 93 mmol/L — ABNORMAL LOW (ref 98–111)
Creatinine, Ser: 1.44 mg/dL — ABNORMAL HIGH (ref 0.61–1.24)
GFR, Estimated: 55 mL/min — ABNORMAL LOW (ref >60)
Glucose, Bld: 107 mg/dL — ABNORMAL HIGH (ref 70–99)
Potassium: 3.3 mmol/L — ABNORMAL LOW (ref 3.5–5.1)
Sodium: 131 mmol/L — ABNORMAL LOW (ref 135–145)

## 2023-01-14 MED ORDER — CYCLOBENZAPRINE HCL 5 MG PO TABS
5.0000 mg | ORAL_TABLET | Freq: Three times a day (TID) | ORAL | Status: DC
  Administered 2023-01-14 – 2023-01-19 (×16): 5 mg via ORAL
  Filled 2023-01-14 (×16): qty 1

## 2023-01-14 MED ORDER — POTASSIUM CHLORIDE 20 MEQ PO PACK
40.0000 meq | PACK | Freq: Once | ORAL | Status: AC
  Administered 2023-01-14: 40 meq via ORAL
  Filled 2023-01-14: qty 2

## 2023-01-14 MED ORDER — SENNA 8.6 MG PO TABS
2.0000 | ORAL_TABLET | Freq: Every day | ORAL | Status: DC
  Administered 2023-01-14 – 2023-01-18 (×5): 17.2 mg via ORAL
  Filled 2023-01-14 (×5): qty 2

## 2023-01-14 NOTE — Progress Notes (Signed)
Occupational Therapy Session Note  Patient Details  Name: Mahonri Seiden MRN: [id number] Date of Birth: [birth year]  Today's Date: 01/14/2023 OT Individual Time: 1030-1045 OT Individual Time Calculation (min): 15 min Missed 30 min d/t wound vac change     01/14/23 1124  OT Individual Time Calculation  OT Individual Start Time 1030  OT Individual Stop Time 1045  OT Individual Time Calculation (min) 15 min  OT Missed Time  OT Amount of Missed Time 30 Minutes  OT Missed Time Reason Nursing care;Other (comment) (wound vac change)     Short Term Goals: Week 1:  OT Short Term Goal 1 (Week 1): Pt will complete sit > stand in prep for ADL with Min A using LRAD OT Short Term Goal 2 (Week 1): Pt will complete toileting with min A using LRAD OT Short Term Goal 3 (Week 1): Pt will complete toilet transfer with CGA using LRAD  Skilled Therapeutic Interventions/Progress Updates:   Pt seen for brief skilled OT session as upon OT arrival for scheduled session pt receiving nursing procedure for wound vac change. OT checked back and procedure completed and pt willing to participate in short modified session. Pt with ROHO cushion in w.c without cover. Pt also has red (med) tband bed side but not aware of way to use for tricep press. OT training in B triceps press and completed 2 sets of 10 reps. OT obtained and implemented ROHO cushion for w/c when next OOB for added skin protection and infection prevention. OT issued and trained in heavy resistance foam grasp cube use for strength, activity tolerance and overall CDP mngt. Pt able to complete 20 reps bly. Left pt bed level with all needs and safety measures in place.   Therapy Documentation Precautions:  Precautions Precautions: Fall, Other (comment) Precaution Comments: abdominal wound, bilateral AFOs Restrictions Weight Bearing Restrictions: No    Therapy/Group: Individual Therapy  Barnabas Lister 01/14/2023, 7:58 AM

## 2023-01-14 NOTE — Progress Notes (Signed)
Physical Therapy Session Note  Patient Details  Name: Timothy Macias MRN: [id number] Date of Birth: 05-17-1961  Today's Date: 01/14/2023 PT Individual Time: 4818-5631 PT Individual Time Calculation (min): 70 min   Short Term Goals: Week 1:  PT Short Term Goal 1 (Week 1): pt will perform bed mobility with min A PT Short Term Goal 2 (Week 1): pt will transfer sit<>stand with LRAD and min A PT Short Term Goal 3 (Week 1): pt will ambulate 40ft with LRAD and min A  Skilled Therapeutic Interventions/Progress Updates:   Received pt semi-reclined in bed, pt agreeable to PT treatment, and reported pain >10/10 in abdomen, buttocks, and joints. Pt also reported new stage 2 wound to buttocks. RN notified and present to administer medications while therapist attempted to locate 24x18 Roho cushion; however one not available, therefore replaced current Roho cushion with different one and pt reported slight relief. Offered air mattress for pressure relief, however pt politely refused. Wound care RN then arrived and pt performed bed level SLR 2x10 bilaterally and hip abduction 2x10 bilaterally while waiting to determine if dressing would be changed now or later - plan to come back at 10am. Doffed PRAFOs and donned socks with total A. Pt transferred semi-reclined<>sitting R EOB with HOB elevated and use of bedrails using logroll technique and mod HHA. Donned bilateral AFOs with total A and stood from elevated EOB with bari RW and min A and transferred into WC with CGA. Pt sat in WC at sink and brushed teeth, washed face, and washed hair with set up assist. Pt then stood with bari RW and CGA and ambulated 68ft with bari RW and CGA with close WC follow - limited by increased dizziness/lightheadedness that decreased with seated rest break. Returned to room and concluded session with pt sitting in [facility] with all needs within reach. Provided pt with fresh drink.   Therapy Documentation Precautions:   Precautions Precautions: Fall, Other (comment) Precaution Comments: abdominal wound, bilateral AFOs Restrictions Weight Bearing Restrictions: No  Therapy/Group: Individual Therapy Alfonse Alpers PT, DPT  01/14/2023, 6:47 AM

## 2023-01-14 NOTE — Progress Notes (Signed)
Notified MD of low b/p. Blood pressure med not given and 30 mg oxy administered at 830 and the other 30 mg after b/p rechecked at 10.

## 2023-01-14 NOTE — Progress Notes (Signed)
PROGRESS NOTE   Subjective/Complaints: +abdominal pain at wound site Had 2BM yesterday but both were hard Reports pressure wound on sacrum Hypotensive this AM   ROS:   Pt denies SOB, CP, N/V/C/D, and vision changes, +abdominal pain Objective:   No results found. Recent Labs    01/14/23 0606  WBC 9.8  HGB 12.9*  HCT 41.5  PLT 346   Recent Labs    01/14/23 0606  NA 131*  K 3.3*  CL 93*  CO2 29  GLUCOSE 107*  BUN 28*  CREATININE 1.44*  CALCIUM 8.4*     Intake/Output Summary (Last 24 hours) at 01/14/2023 1042 Last data filed at 01/14/2023 0746 Gross per 24 hour  Intake 720 ml  Output 1200 ml  Net -480 ml        Physical Exam: Vital Signs Blood pressure (!) 95/58, pulse 68, temperature 98.1 F (36.7 C), resp. rate 18, height 5\' 8"  (1.727 m), weight (!) 161.3 kg, SpO2 94 %.  Blood pressure 134/78, pulse 76, temperature 98 F (36.7 C), temperature source Oral, resp. rate (!) 25, height 5\' 8"  (1.727 m), weight (!) 167.7 kg, SpO2 95 %. Physical Exam  General: No acute distress Mood and affect are appropriate Heart: Regular rate and rhythm no rubs murmurs or extra sounds Lungs: Clear to auscultation, breathing unlabored, no rales or wheezes Abdomen: Positive bowel sounds, soft nontender to palpation, nondistended  Skin: wound VAC on abd- vertical - looks stable- has good suction  Musculoskeletal:     Cervical back: Neck supple. No tenderness.     Comments: Ues 4th/5th digit of L hand flexed at MCP and PIP Ue's 5-/5 in arms except FA 4-/5 B/L   LE's HF 4-/5; KE 4+/5 B/L, DF 2-/5 and PF 2-/5 B/L B/L foot drop  Skin:    General: Skin is warm and dry.     Comments: trace bogginess of B/L heels- dressing on heels for prevention Needs PRAFO's. Healed stage 2 on right heel Has mild MASD between legs, skin folds and groin, wound with >80% granulation tissue Neurological:     Mental Status: He is alert and  oriented to person, place, and time.     Sensory: Sensory deficit present.     Motor: Weakness present.     Comments: Severe neuropathy in legs B/L, finger tips also with reduced sensation  Has absent sensation to light touch to knees B/L- with foot atrophy B/L   Psychiatric:        Mood and Affect: Mood normal.        Behavior: Behavior normal. Bright and motivated        Assessment/Plan: 1. Functional deficits which require 3+ hours per day of interdisciplinary therapy in a comprehensive inpatient rehab setting. Physiatrist is providing close team supervision and 24 hour management of active medical problems listed below. Physiatrist and rehab team continue to assess barriers to discharge/monitor patient progress toward functional and medical goals  Care Tool:  Bathing    Body parts bathed by patient: Right arm, Left arm, Chest, Abdomen, Face   Body parts bathed by helper: Front perineal area, Buttocks, Right lower leg, Left lower leg  Bathing assist Assist Level: Maximal Assistance - Patient 24 - 49%     Upper Body Dressing/Undressing Upper body dressing   What is the patient wearing?: Pull over shirt    Upper body assist Assist Level: Minimal Assistance - Patient > 75%    Lower Body Dressing/Undressing Lower body dressing      What is the patient wearing?: Underwear/pull up     Lower body assist Assist for lower body dressing: Maximal Assistance - Patient 25 - 49%     Toileting Toileting    Toileting assist Assist for toileting: Maximal Assistance - Patient 25 - 49%     Transfers Chair/bed transfer  Transfers assist     Chair/bed transfer assist level: Contact Guard/Touching assist     Locomotion Ambulation   Ambulation assist   Ambulation activity did not occur: Safety/medical concerns (fatigue, dizziness, weakness, decreased balance)  Assist level: Minimal Assistance - Patient > 75% Assistive device: Walker-rolling Max distance: 18    Walk 10 feet activity   Assist  Walk 10 feet activity did not occur: Safety/medical concerns (fatigue, dizziness, weakness, decreased balance)  Assist level: Minimal Assistance - Patient > 75% Assistive device: Walker-rolling   Walk 50 feet activity   Assist Walk 50 feet with 2 turns activity did not occur: Safety/medical concerns (fatigue, dizziness, weakness, decreased balance)         Walk 150 feet activity   Assist Walk 150 feet activity did not occur: Safety/medical concerns (fatigue, dizziness, weakness, decreased balance)         Walk 10 feet on uneven surface  activity   Assist Walk 10 feet on uneven surfaces activity did not occur: Safety/medical concerns (fatigue, dizziness, weakness, decreased balance)         Wheelchair     Assist Is the patient using a wheelchair?: Yes Type of Wheelchair: Manual Wheelchair activity did not occur: Safety/medical concerns (fatigue, weakness)         Wheelchair 50 feet with 2 turns activity    Assist    Wheelchair 50 feet with 2 turns activity did not occur: Safety/medical concerns (fatigue, weakness)       Wheelchair 150 feet activity     Assist  Wheelchair 150 feet activity did not occur: Safety/medical concerns (fatigue, weakness)       Blood pressure (!) 95/58, pulse 68, temperature 98.1 F (36.7 C), resp. rate 18, height 5\' 8"  (1.727 m), weight (!) 161.3 kg, SpO2 94 %.  Medical Problem List and Plan: 1. Functional deficits secondary to debility from CHF exacerbation             -patient may not shower until wound VAC removed             -ELOS/Goals: 12-16 days -supervision to min A  Grounds pass ordered  Con't CIR- PT and OT 2.  Antithrombotics: -DVT/anticoagulation:  Pharmaceutical: Heparin             -antiplatelet therapy: none   3. Pain Management: Severe lumbar spinal stenosis  Tylenol, oxycodone 60 mg TID as needed- is home dose             -continue pregablin 100 mg daily-  just restarted -1/20- will change Oxycodone to 60 mg TID- since is home regimen-  4. Mood/Behavior/Sleep: LCSW to evaluate and provide emotional support             -continue Cymbalta 60 mg BID             -  antipsychotic agents: n/a   5. Neuropsych/cognition: This patient is capable of making decisions on his own behalf.   6. Skin/Wound Care: Routine skin care checks             -maintain wound VAC to midline incision; change M/W/F             -consult [place] RN   7. Fluids/Electrolytes/Nutrition: Strict Is and Os and follow-up chemistries             -continue Prosource, Boost, iron supplements   8: HFmidrangeEF:             -daily weight             -weight up>>resuming Lasix 80 mg daily on 1/16             -losartan 12.5 mg daily resumed 1/16   1/20- weight stable- con't to monitor trend 9: OSA on CPAP- uses home CPAP                10: Atrial fibrillation: persistent but alternating SR; s/p DCCV             -Eliquis held; general surgery to approve restarting- they said would determine 1/17?             -continue Pacerone 400 mg daily, metoprolol 100 mg daily   11: Class 3 obesity: BMI 56.21 1/16   12: Hypertension: monitor TID and prn             -continue Lasix 80 mg daily             -d/c losartan since currently hypotensive 13: Peripheral neuropathy/lumbar spinal stenosis/chronic pain             -neurologist Dr. Metta Clines -oxycodone 60 mg TID, lorazepam 0.5 mg, pregabalin 100 mg at home per Dr. Gar Ponto. Provided pain relief journal   14: GERD/dyspepsia: Protonix 40 mg q HS             -give Tums before meals   15: BPH: increase Flomax to home dose>> 0.8 mg daily   16: COPD (previous tobacco use): continue Yupelri nebs daily- wheezed at rest with exam exertion- pt refuses Albuterol per chart.    17: Leukocytosis: CT abdomen/pelvis ordered   18: Loose stool: resolved   19. B/L Foot drop due to severe peripheral neuropathy- has AFO's but needs PRAFOs at  bedtime- will order.    20. MASD- at groin and skin folds/between legs- continue Nystatin, add Gerrhard's butt cream  21. Left shoulder pain: XR ordered and shows severe glenohumeral arthritis. Kpad ordered.   22. Dry skin: Eucerin ordered  23. Urinary urgency: purewick ordered for night, discussed with patient that he can stop using this when he feels ready.   24. Mild ileus: nausea resolved, advance to regular diet. Add senna 2 tablets HS.   25. Abdominal wound: continue surgical consultation, appreciate surgery opening up false bottom of wound.      LOS: 6 days A FACE TO FACE EVALUATION WAS PERFORMED  Clide Deutscher Meghan Tiemann 01/14/2023, 10:42 AM

## 2023-01-14 NOTE — Progress Notes (Signed)
Patient ID: Timothy Macias, male   DOB: 1961-10-17, 62 y.o.   MRN: [id number]  Sw informed pt will potentially discharge on Saturday. Family education tomorrow instead of 1/27.

## 2023-01-14 NOTE — Consult Note (Addendum)
[place] Nurse wound follow up Wound type: surgical  Seen with Clenton Pare, PA CCS today   Wound bed: clean, moist visible sutures at proximal aspect,  80% red, 20% yellow subcutaneous tissue  Drainage (amount, consistency, odor) some pooling serosanguinous at distal aspect  Periwound: intact  Dressing procedure/placement/frequency: Filled wound with  _1__ piece of black foam  Sealed NPWT dressing at 173mm HG ; cannister changed at this visit.  Patient received PO pain medication per bedside nurse prior to dressing change Patient tolerated procedure well.   [place] [place] nurse will continue to provide NPWT dressing changed due to the complexity of the dressing change.     Thank you,    Calel Pisarski MSN, RN-BC, [employer]

## 2023-01-14 NOTE — Progress Notes (Signed)
Patient has home CPAP at beside.  RT assistance not needed at this time.

## 2023-01-14 NOTE — Progress Notes (Signed)
Occupational Therapy Session Note  Patient Details  Name: Timothy Macias MRN: [id number] Date of Birth: 04-May-1961  Today's Date: 01/14/2023 OT Individual Time: 7902-4097 & 1450-1530 OT Individual Time Calculation (min): 70 min & 40 min   Short Term Goals: Week 1:  OT Short Term Goal 1 (Week 1): Pt will complete sit > stand in prep for ADL with Min A using LRAD OT Short Term Goal 2 (Week 1): Pt will complete toileting with min A using LRAD OT Short Term Goal 3 (Week 1): Pt will complete toilet transfer with CGA using LRAD  Skilled Therapeutic Interventions/Progress Updates:  Session 1 Skilled OT intervention completed with focus on functional transfers, [facility] vs [place] education, home/kitchen management education. Pt received semi-supine in bed, agreeable to session. Un-rated pain reported in buttocks, pre-medicated and nursing aware. OT offered rest breaks, repositioning and distraction throughout for pain reduction.  Pt expressed fatigue however agreeable to transition OOB. Supervision with increased time, HOB elevated and heavy use of bed rails (pt has these features at home). CGA sit > stand using bari walker, then supervision stand step transfer to w/c. Nursing in room to switch out wound vac cannister.  Transported pt dependently in w/c for time management <> ADL kitchen. Pt was able to report and accurately recall his kitchen set up and typical meal prep mobility routine. Pt would primarily use [place] using his feet to mobilize within the kitchen, but on good days would use his walker and walk. In kitchen, pt demonstrated ability to reach into various cabinet heights, gather items and transfer them from counter to table, open/close fridge door, all at the w/c level with use of bilateral feet and occasional hands for self-propulsion. Discussed adaptations to be made to the kitchen to enable accessibility of items from a WC level including bringing items down to counter level, as well as  modification of using plastic dinnerware and cups to prevent breakage as pt's primary complaint is frequent dropping and breaking of items due to BUE numbness from neuropathy. Discussed precautions of avoiding reaching (especially heavy) items over head.  Pt initiated discussion on potential qualification for [place] for mobility vs manual due to the complexities with his 4 extremities. OT provided education on the advantages of PWC including independence within the home and during community mobility however also discussed accessibility challenges with fitting it in through doorways etc and insurance coverage. Told him OT would discuss with primary PT. Back in room pt remained seated in w/c per preference, with all needs in reach at end of session.  Vitals Seated EOB- 109/70 (asymptomatic) Seated after transfer to w/c- 102/70 (symptomatic)  Session 2 Skilled OT intervention completed with focus on family education and DC planning with pt's wife present. Pt received seated in w/c, agreeable to session. No pain reported, however fatigue expressed.  Per earlier conversation, pt indicated his desire to DC sooner, with OT utilizing current time to discuss with wife her opinion and readiness for shortened LOS. Wife verbalized that pt was actually walking shorter/lesser distances than he has been here and feels pt is close to his baseline, though would like to see his energy improved, and wants all equipment and follow up care to be arranged prior to DC. From OT stand point, wife reports pt required up to min/mod A especially for LB and feels he is at baseline even with his dizziness and BP episodes. Pt already has adjustable bed, BSC, TTB (though discussed his inability to shower until wound vac removed) RW,  [place] (though new roho cushion being recommended for pressure relief on buttocks/sacrum. OT reviewed how [place] would be considered OOP due to receiving [place] through insurance less than 2 years ago, and also  functional abilities being greater currently than what PWC would entail. Reviewed [place] mobility and modifications that pt can utilize and has practiced/is used to at baseline.   Care team notified of pt's wife agreement to participate in family ed 1/23 with OT/PT to demo readiness/safety, then plan for DC at end of week with team also notified about home health nursing requested for wound care.   Pt completed 3 sit > stands for demo purposes and pressure relief technique with initial min A to power up using bari walker, fading to supervision on last 2 reps.   Pt remained seated in w/c, with family present and with all needs in reach at end of session.   Therapy Documentation Precautions:  Precautions Precautions: Fall, Other (comment) Precaution Comments: abdominal wound, bilateral AFOs Restrictions Weight Bearing Restrictions: No    Therapy/Group: Individual Therapy  [name], MS, OTR/L  01/14/2023, 3:41 PM

## 2023-01-14 NOTE — Progress Notes (Signed)
   Progress Note     Subjective: No new complaints. Ongoing abdominal and buttock discomfort - reports stage 2 pressure wound on sacrum. Tolerating Po and reports 2 Bms yesterday  Objective: Vital signs in last 24 hours: Temp:  [98 F (36.7 C)-98.3 F (36.8 C)] 98.1 F (36.7 C) (01/22 0454) Pulse Rate:  [68-79] 68 (01/22 0802) Resp:  [18] 18 (01/22 0745) BP: (92-104)/(54-71) 95/58 (01/22 0802) SpO2:  [94 %-96 %] 94 % (01/22 0745) Last BM Date : 01/13/23  Intake/Output from previous day: 01/21 0701 - 01/22 0700 In: 240 [P.O.:240] Out: 1200 [Urine:1200] Intake/Output this shift: Total I/O In: 720 [P.O.:720] Out: -   PE: General: pleasant, WD, obese male who is laying in bed in NAD Heart: regular, rate, and rhythm.  Lungs: Respiratory effort nonlabored Abd: soft, appropriately ttp, midline wound with VAC present and SS fluid in canister. Wound >80% granulation tissue. There was a false bottom centrally that I opened, there is a very small 2-3 cm tunnel left lower wound. Fascia in tact.  Wound with false bottom.    False bottom opened.   Psych: A&Ox3 with an appropriate affect.    Lab Results:  Recent Labs    01/14/23 0606  WBC 9.8  HGB 12.9*  HCT 41.5  PLT 346   BMET Recent Labs    01/14/23 0606  NA 131*  K 3.3*  CL 93*  CO2 29  GLUCOSE 107*  BUN 28*  CREATININE 1.44*  CALCIUM 8.4*   PT/INR No results for input(s): "LABPROT", "INR" in the last 72 hours. CMP     Component Value Date/Time   NA 131 (L) 01/14/2023 0606   K 3.3 (L) 01/14/2023 0606   CL 93 (L) 01/14/2023 0606   CO2 29 01/14/2023 0606   GLUCOSE 107 (H) 01/14/2023 0606   BUN 28 (H) 01/14/2023 0606   CREATININE 1.44 (H) 01/14/2023 0606   CALCIUM 8.4 (L) 01/14/2023 0606   PROT 6.5 01/09/2023 0846   ALBUMIN 3.1 (L) 01/09/2023 0846   AST 36 01/09/2023 0846   ALT 36 01/09/2023 0846   ALKPHOS 63 01/09/2023 0846   BILITOT 1.0 01/09/2023 0846   GFRNONAA 55 (L) 01/14/2023 0606    Lipase  No results found for: "LIPASE"     Studies/Results: No results found.  Anti-infectives: Anti-infectives (From admission, onward)    None        Assessment/Plan  POD10, s/p ex lap with LOA and SBR for SBO, Dr. Donne Hazel 1/10 - reg diet - continue VAC and change M/W/F - will see again Friday, and PRN on Wednesday if [facility] RN concerned. - mobilize with nursing and PT - IS, pulm toilet - continue chronic home pain meds  - CT 1/17 without significant post-op complication  - bowel regimen    FEN - soft diet  VTE - SQH, eliquis ID - completed 24 hrs post op abx   - below per rehab MD -  Afib T. CHF HTN OSA NASH Acute on CKD  Morbid obesity  Dysuria - UCx pending  Heel wounds   LOS: 6 days    Jill Alexanders, [facility] Surgery 01/14/2023, 10:36 AM Please see Amion for pager number during day hours 7:00am-4:30pm

## 2023-01-15 DIAGNOSIS — R5381 Other malaise: Secondary | ICD-10-CM | POA: Diagnosis not present

## 2023-01-15 DIAGNOSIS — L304 Erythema intertrigo: Secondary | ICD-10-CM | POA: Insufficient documentation

## 2023-01-15 MED ORDER — TOLNAFTATE 1 % EX POWD
Freq: Two times a day (BID) | CUTANEOUS | Status: DC
  Filled 2023-01-15: qty 45

## 2023-01-15 MED ORDER — METOPROLOL SUCCINATE ER 50 MG PO TB24
50.0000 mg | ORAL_TABLET | Freq: Every day | ORAL | Status: DC
  Administered 2023-01-16 – 2023-01-19 (×4): 50 mg via ORAL
  Filled 2023-01-15 (×4): qty 1

## 2023-01-15 MED ORDER — FUROSEMIDE 20 MG PO TABS
20.0000 mg | ORAL_TABLET | Freq: Once | ORAL | Status: AC
  Administered 2023-01-15: 20 mg via ORAL
  Filled 2023-01-15: qty 1

## 2023-01-15 NOTE — Progress Notes (Signed)
Occupational Therapy Session Note  Patient Details  Name: Hyatt Capobianco MRN: [id number] Date of Birth: 1961/10/15  Today's Date: 01/15/2023 OT Individual Time: 0962-8366 & 1303-1400 OT Individual Time Calculation (min): 42 min & 57 min   Short Term Goals: Week 1:  OT Short Term Goal 1 (Week 1): Pt will complete sit > stand in prep for ADL with Min A using LRAD OT Short Term Goal 2 (Week 1): Pt will complete toileting with min A using LRAD OT Short Term Goal 3 (Week 1): Pt will complete toilet transfer with CGA using LRAD  Skilled Therapeutic Interventions/Progress Updates:  Session 1 Skilled OT intervention completed with focus on ADL retraining, functional transfers. Pt received upright, agreeable to session. Un-rated pain reported in abdomen and buttocks; pre-medicated. OT offered rest breaks and repositioning throughout for pain reduction.  Agreeable to self-care. Attempted to acces peri-regions at bed level due to no-seam shorts on however due to body habitus pt was unable to reach (and wife does waist down bathing/dressing at baseline). Upon cleaning of skin folds, pt's skin was noted to have a yeasty build up with peeling skin. MD notified of request for skin antifungal powder as pt reports he uses this at home to prevent this occurrence. Required min A for peri-areas then pt able to use bed features to access upper thigh. Donned bilateral AFOs with total A (baseline assist), then supervision transition to EOB with use of bed features. No dizziness reported.  Able to doff shirt with mod I, bathe UB with mod I, then donn shirt with mod I, and un-seamed shorts already donned. 2 attempts needed to power up into stance but able to do with increased time with supervision using bari walker. Short ambulatory transfer to w/c with supervision with assist only needed for wound vac cord. No dizziness reported therefore BP not assessed.  Pt remained seated in w/c, with with all needs in reach at  end of session.  Session 2 Skilled OT intervention completed with focus on family education with wife present regarding ADL and functional transfer recommendations. Pt received seated in w/c with wife present, agreeable to session. No pain reported.  Reviewed with wife avoidance of showers due to wound vac (per pt comment this AM about planning to get in the shower for bathing in which this OT advised against) and wife in agreement. Discussed plan for OP wound vac to be delivered Friday prior to his return home. Discussed recommendations for bed level peri bathing for ease of access due to body habitus as well as long handled sponge, and EOB or sink side for UB.   OT assessed vitals due to pt report of low BP while walking with PT earlier.   Vitals 107/77 BP (seated, at rest) 103/74 BP (in stance)  With wife assisting with supervision, pt completed sit > stand using bari walker, then stand pivot to [facility]. Cues needed for wife's body positioning to be beside and closer to pt in case of LOB as wife was positioned in front of pt as well as education on placement of BSC up against wall to increase sturdiness.  Seated in w/c, pt then participated in putty exercises for gross grasping and fine motor pinching during rolling putty out with PVC pipe, cutting shapes out with cookie cutter then using wrist flex/ext with cutting pieces with PVC pipe.   Pt remained seated in w/c, with family present for upcoming PT education session and with all needs in reach at end of session.  Therapy Documentation Precautions:  Precautions Precautions: Fall, Other (comment) Precaution Comments: abdominal wound, bilateral AFOs Restrictions Weight Bearing Restrictions: No    Therapy/Group: Individual Therapy  [name], MS, OTR/L  01/15/2023, 3:09 PM

## 2023-01-15 NOTE — Progress Notes (Addendum)
Patient ID: Timothy Macias, male   DOB: 07/11/1961, 62 y.o.   MRN: [id number]  [facility] referral sent to:  -[facility]: Patient declined due to no nursing staff in the area. -[facility]:  Patient declined due to insurance. [facility]: Patient declined due to service area and frequency of wound changes. -[facility]: Patient declined due to insurance.  -[facility]: Patient decline due to staffing.  -[facility]: Patient declined due to insurance. -[facility]: Declined due to no staff in the area.   01/16/2023:  [facility] referral sent to:  -[facility]: Office has closed. -[facility]: -[facility]: -[facility]:

## 2023-01-15 NOTE — Progress Notes (Addendum)
PROGRESS NOTE   Subjective/Complaints: +pain at sacrum, oxycodone helps Nursing reports that BP soft again today, metoprolol dose decreased Happy with d/c on saturday  ROS:   Pt denies SOB, CP, N/V/C/D, and vision changes, +abdominal pain, +pain from sacral pressure injury Objective:   No results found. Recent Labs    01/14/23 0606  WBC 9.8  HGB 12.9*  HCT 41.5  PLT 346   Recent Labs    01/14/23 0606  NA 131*  K 3.3*  CL 93*  CO2 29  GLUCOSE 107*  BUN 28*  CREATININE 1.44*  CALCIUM 8.4*     Intake/Output Summary (Last 24 hours) at 01/15/2023 1309 Last data filed at 01/15/2023 0814 Gross per 24 hour  Intake 600 ml  Output 500 ml  Net 100 ml        Physical Exam: Vital Signs Blood pressure (!) 90/59, pulse 84, temperature 97.7 F (36.5 C), temperature source Oral, resp. rate 18, height 5\' 8"  (1.727 m), weight (!) 163.6 kg, SpO2 90 %.  Blood pressure 134/78, pulse 76, temperature 98 F (36.7 C), temperature source Oral, resp. rate (!) 25, height 5\' 8"  (1.727 m), weight (!) 167.7 kg, SpO2 95 %. Physical Exam  General: No acute distress Mood and affect are appropriate Heart: Regular rate and rhythm no rubs murmurs or extra sounds Lungs: Clear to auscultation, breathing unlabored, no rales or wheezes Abdomen: Positive bowel sounds, soft nontender to palpation, nondistended  Skin: wound VAC on abd- vertical - looks stable- has good suction Musculoskeletal:     Cervical back: Neck supple. No tenderness.     Comments: Ues 4th/5th digit of L hand flexed at MCP and PIP Ue's 5-/5 in arms except FA 4-/5 B/L   LE's HF 4-/5; KE 4+/5 B/L, DF 2-/5 and PF 2-/5 B/L B/L foot drop  Skin:    General: Skin is warm and dry.     Comments: trace bogginess of B/L heels- dressing on heels for prevention Needs PRAFO's. Healed stage 2 on right heel Has mild MASD between legs, skin folds and groin, wound with >80%  granulation tissue Neurological:     Mental Status: He is alert and oriented to person, place, and time.     Sensory: Sensory deficit present.     Motor: Weakness present.     Comments: Severe neuropathy in legs B/L, finger tips also with reduced sensation  Has absent sensation to light touch to knees B/L- with foot atrophy B/L   Psychiatric:        Mood and Affect: Mood normal.        Behavior: Behavior normal. Bright and motivated        Assessment/Plan: 1. Functional deficits which require 3+ hours per day of interdisciplinary therapy in a comprehensive inpatient rehab setting. Physiatrist is providing close team supervision and 24 hour management of active medical problems listed below. Physiatrist and rehab team continue to assess barriers to discharge/monitor patient progress toward functional and medical goals  Care Tool:  Bathing    Body parts bathed by patient: Right arm, Left arm, Chest, Abdomen, Right upper leg, Left upper leg, Face   Body parts bathed by helper: Right  lower leg, Left lower leg, Front perineal area     Bathing assist Assist Level: Moderate Assistance - Patient 50 - 74%     Upper Body Dressing/Undressing Upper body dressing   What is the patient wearing?: Pull over shirt    Upper body assist Assist Level: Set up assist    Lower Body Dressing/Undressing Lower body dressing      What is the patient wearing?: Pants     Lower body assist Assist for lower body dressing: Minimal Assistance - Patient > 75%     Toileting Toileting    Toileting assist Assist for toileting: Maximal Assistance - Patient 25 - 49%     Transfers Chair/bed transfer  Transfers assist     Chair/bed transfer assist level: Contact Guard/Touching assist     Locomotion Ambulation   Ambulation assist   Ambulation activity did not occur: Safety/medical concerns (fatigue, dizziness, weakness, decreased balance)  Assist level: Contact Guard/Touching  assist Assistive device: Walker-rolling Max distance: 32ft   Walk 10 feet activity   Assist  Walk 10 feet activity did not occur: Safety/medical concerns (fatigue, dizziness, weakness, decreased balance)  Assist level: Contact Guard/Touching assist Assistive device: Walker-rolling   Walk 50 feet activity   Assist Walk 50 feet with 2 turns activity did not occur: Safety/medical concerns (fatigue, dizziness, weakness, decreased balance)         Walk 150 feet activity   Assist Walk 150 feet activity did not occur: Safety/medical concerns (fatigue, dizziness, weakness, decreased balance)         Walk 10 feet on uneven surface  activity   Assist Walk 10 feet on uneven surfaces activity did not occur: Safety/medical concerns (fatigue, dizziness, weakness, decreased balance)         Wheelchair     Assist Is the patient using a wheelchair?: Yes Type of Wheelchair: Manual Wheelchair activity did not occur: Safety/medical concerns (fatigue, weakness)         Wheelchair 50 feet with 2 turns activity    Assist    Wheelchair 50 feet with 2 turns activity did not occur: Safety/medical concerns (fatigue, weakness)       Wheelchair 150 feet activity     Assist  Wheelchair 150 feet activity did not occur: Safety/medical concerns (fatigue, weakness)       Blood pressure (!) 90/59, pulse 84, temperature 97.7 F (36.5 C), temperature source Oral, resp. rate 18, height 5\' 8"  (1.727 m), weight (!) 163.6 kg, SpO2 90 %.  Medical Problem List and Plan: 1. Functional deficits secondary to debility from CHF exacerbation             -patient may not shower due to wound vac. Outpatient wound ac ordered.              -ELOS/Goals: 12-16 days -supervision to min A  Grounds pass ordered  Con't CIR- PT and OT 2.  Impaired mobility: continue Heparin. Add SCDs.  3. Severe lumbar spinal stenosis: Provided pain relief journal. Tylenol, oxycodone 60 mg TID as needed- is  home dose             -continue pregablin 100 mg daily- just restarted -1/20- will change Oxycodone to 60 mg TID- since is home regimen-  4. Mood/Behavior/Sleep: LCSW to evaluate and provide emotional support             -continue Cymbalta 60 mg BID             -antipsychotic agents: n/a  5. Neuropsych/cognition: This patient is capable of making decisions on his own behalf.   6. Abdominal incision: maintain wound vac.    7. Fluids/Electrolytes/Nutrition: Strict Is and Os and follow-up chemistries             -continue Prosource, Boost, iron supplements   8: HFmidrangeEF:             -daily weight             -weight up>>resuming Lasix 80 mg daily on 1/16             -losartan 12.5 mg daily resumed 1/16   Add additional 20mg  Lasix today 9: OSA on CPAP- uses home CPAP                10: Atrial fibrillation: persistent but alternating SR; s/p DCCV             -Eliquis held; general surgery to approve restarting- they said would determine 1/17?             -continue Pacerone 400 mg daily, metoprolol 100 mg daily   11: Class 3 obesity: BMI 56.21 1/16   12: Hypertension: monitor TID and prn             -continue Lasix 80 mg daily             -d/c losartan since currently hypotensive 13: Peripheral neuropathy/lumbar spinal stenosis/chronic pain             -neurologist Dr. Metta Clines -oxycodone 60 mg TID, lorazepam 0.5 mg, pregabalin 100 mg at home per Dr. Gar Ponto. Provided pain relief journal   14: GERD/dyspepsia: Protonix 40 mg q HS             -give Tums before meals   15: BPH: increase Flomax to home dose>> 0.8 mg daily   16: COPD (previous tobacco use): continue Yupelri nebs daily- wheezed at rest with exam exertion- pt refuses Albuterol per chart.    17: Leukocytosis: CT abdomen/pelvis ordered   18: Loose stool: resolved   19. B/L Foot drop due to severe peripheral neuropathy- has AFO's but needs PRAFOs at bedtime- will order.    20. MASD- at groin and skin  folds/between legs- continue Nystatin, add Gerrhard's butt cream  21. Left shoulder pain: XR ordered and shows severe glenohumeral arthritis. Kpad ordered.   22. Dry skin: Eucerin ordered  23. Urinary urgency: purewick ordered for night, discussed with patient that he can stop using this when he feels ready.   24. Mild ileus: nausea resolved, advance to regular diet. Add senna 2 tablets HS.   25. Abdominal wound: continue surgical consultation, appreciate surgery opening up false bottom of wound.      LOS: 7 days A FACE TO FACE EVALUATION WAS PERFORMED  Timothy Macias 01/15/2023, 1:09 PM

## 2023-01-15 NOTE — Progress Notes (Signed)
Patient has home CPAP machine at bedside.

## 2023-01-15 NOTE — Progress Notes (Signed)
Physical Therapy Session Note  Patient Details  Name: Timothy Macias MRN: [id number] Date of Birth: 01-14-1961  Today's Date: 01/15/2023 PT Individual Time:  Session 1: 0175-1025    Session 2: 1400-1455 PT Individual Time Calculation (min):  Session 1: 45 min Session 2: 55 min   Short Term Goals: Week 1:  PT Short Term Goal 1 (Week 1): pt will perform bed mobility with min A PT Short Term Goal 2 (Week 1): pt will transfer sit<>stand with LRAD and min A PT Short Term Goal 3 (Week 1): pt will ambulate 54ft with LRAD and min A  Skilled Therapeutic Interventions/Progress Updates:    Session 1: Chart reviewed and pt agreeable to therapy. Pt received seated in WC with c/o pain in buttocks at wound site that was not quantified. Session focused on functional transfers and amb to prepare for family education necessary for home access. Pt initiated session with transfer to therapy gym for time conservation. Pt then completed 3 sit to stands with CGA + BRW. Pt then amb 83ft to mat table using CGA + RW. Pt then c/o dizziness. BP assessed and found to be 92/67mmHg (67). Pt then transferred to [place] with CGA + RW + 2 for caution. Pt transferred back to room. In room, PT and pt discussed home access plan to confirm family education plan. Pt stated dizziness subsiding after rest in WC. At end of session, pt was left seated in [facility] with alarm engaged, nurse call bell and all needs in reach.  Session 2: Chart reviewed and pt agreeable to therapy. Pt received seated in WC with c/o pain in buttocks at wound site that was not quantified. Session focused on family education necessary for home access and balance training to improve independent mobility. For family education, PT demonstrated sit to stand and amb with gait belt to wife. PT also gave VC and demonstration on safe body positioning while guarding. Wife then taught back and demonstrated successful sit to stand and amb of 90ft with CGA + BRW. Pt then transfer to  therapy gym for time conservation. Pt then completed 2x30sec standing with one hand hold on BRW. Pt c/o dizziness and required rest break between activities. Pt then returned to room. At end of session, pt was left seated in [facility] with alarm engaged, nurse call bell and all needs in reach.     Therapy Documentation Precautions:  Precautions Precautions: Fall, Other (comment) Precaution Comments: abdominal wound, bilateral AFOs Restrictions Weight Bearing Restrictions: No   Therapy/Group: Individual Therapy  Marquette Old, PT, DPT 01/15/2023, 3:40 PM

## 2023-01-16 DIAGNOSIS — S31109A Unspecified open wound of abdominal wall, unspecified quadrant without penetration into peritoneal cavity, initial encounter: Secondary | ICD-10-CM | POA: Insufficient documentation

## 2023-01-16 LAB — BASIC METABOLIC PANEL
Anion gap: 9 (ref 5–15)
BUN: 32 mg/dL — ABNORMAL HIGH (ref 8–23)
CO2: 27 mmol/L (ref 22–32)
Calcium: 8.2 mg/dL — ABNORMAL LOW (ref 8.9–10.3)
Chloride: 94 mmol/L — ABNORMAL LOW (ref 98–111)
Creatinine, Ser: 1.45 mg/dL — ABNORMAL HIGH (ref 0.61–1.24)
GFR, Estimated: 55 mL/min — ABNORMAL LOW (ref >60)
Glucose, Bld: 116 mg/dL — ABNORMAL HIGH (ref 70–99)
Potassium: 3 mmol/L — ABNORMAL LOW (ref 3.5–5.1)
Sodium: 130 mmol/L — ABNORMAL LOW (ref 135–145)

## 2023-01-16 MED ORDER — JUVEN PO PACK
1.0000 | PACK | Freq: Two times a day (BID) | ORAL | Status: DC
  Administered 2023-01-16 – 2023-01-19 (×6): 1 via ORAL
  Filled 2023-01-16 (×6): qty 1

## 2023-01-16 MED ORDER — POTASSIUM CHLORIDE 20 MEQ PO PACK
40.0000 meq | PACK | Freq: Two times a day (BID) | ORAL | Status: AC
  Administered 2023-01-16 – 2023-01-17 (×2): 40 meq via ORAL
  Filled 2023-01-16 (×2): qty 2

## 2023-01-16 NOTE — Consult Note (Signed)
[place] Nurse wound follow up Wound type: surgical  Measurement: not obtained  Wound bed: clean, moist, 80% red, 15% yellow fibrin, 5% tan non-viable tissue proximal portion of wound where sutures previously noted  Drainage (amount, consistency, odor) small amount of serosanguinous distal aspect of wound  Periwound: intact  Dressing procedure/placement/frequency: Removed old NPWT dressing Cleansed wound with normal saline Filled wound with  _1__ piece of black foam  Sealed NPWT dressing at 138mm HG  Patient had not received morning pain medication yet but was ok proceeding and tolerated procedure well  WOC nurse will continue to provide NPWT dressing changed due to the complexity of the dressing change. WOC will change NPWT Friday 01/18/2023 with CCS PA.    Thank you,    Jakobe Blau MSN, RN-BC, [employer]

## 2023-01-16 NOTE — Progress Notes (Signed)
Occupational Therapy Session Note  Patient Details  Name: Timothy Macias MRN: [id number] Date of Birth: 08/05/1961  Today's Date: 01/16/2023 OT Individual Time: 1432-1530 OT Individual Time Calculation (min): 58 min    Short Term Goals: Week 1:  OT Short Term Goal 1 (Week 1): Pt will complete sit > stand in prep for ADL with Min A using LRAD OT Short Term Goal 2 (Week 1): Pt will complete toileting with min A using LRAD OT Short Term Goal 3 (Week 1): Pt will complete toilet transfer with CGA using LRAD  Skilled Therapeutic Interventions/Progress Updates:  Pt greeted seated in w/c, pt agreeable to OT intervention. Session focus on BUE [facility] tasks. Pt required total A to place urinal for toileting, pt able to void 300 mL from sitting, NT aware.   Total A transport to gym where pt continues to endorse that his biggest challenge is using his hands during [facility] d/t neuropathy. Education provided on combination of using compensatory methods ( with examples provided)  with graded tasks to facilitate improved functional coordination.   Pt first completed seated peg board task where pt instructed to use thumb tacks to outline shape on cork board to facilitate improved [place]. Pt compensating well by using vision as pt continues to report impaired sensation in bilateral hands. Pt completed task with + time and overall supervision.   Next had pt use ~ 2 inch blocks to duplicate shape structure from visual aid provided, pt did really well with this task as the items being used were easier to manipulate. Finished with seated [place] task where pt using paper to cut/fold various images/shapes. Pt reports increased challenge with this task but did well compensating as needed.   Reports dizziness from sitting in w/c, BP assessed: 99/64 ( 77) HR 97  Ended session with pt seated in w/c with all needs within reach.   Of note wound vac noted to alarm during session stating a message that vac had been off, turned vac  on and alerted nurse to assess for proper seal.   Therapy Documentation Precautions:  Precautions Precautions: Fall, Other (comment) Precaution Comments: abdominal wound, bilateral AFOs Restrictions Weight Bearing Restrictions: No  Pain: unrated pain reported in buttock from sitting, repositioning provided as needed.     Therapy/Group: Individual Therapy  Precious Haws 01/16/2023, 3:52 PM

## 2023-01-16 NOTE — Progress Notes (Signed)
PROGRESS NOTE   Subjective/Complaints: Complains of continued sacral pain, discussed we are trying to get him a roho cushion.  Had a large BM yesterday  ROS:   Pt denies SOB, CP, and vision changes, +abdominal pain, +pain from sacral pressure injury, denies constipation Objective:   No results found. Recent Labs    01/14/23 0606  WBC 9.8  HGB 12.9*  HCT 41.5  PLT 346   Recent Labs    01/14/23 0606 01/16/23 0532  NA 131* 130*  K 3.3* 3.0*  CL 93* 94*  CO2 29 27  GLUCOSE 107* 116*  BUN 28* 32*  CREATININE 1.44* 1.45*  CALCIUM 8.4* 8.2*     Intake/Output Summary (Last 24 hours) at 01/16/2023 1114 Last data filed at 01/16/2023 0930 Gross per 24 hour  Intake 557 ml  Output 2525 ml  Net -1968 ml        Physical Exam: Vital Signs Blood pressure 109/61, pulse 80, temperature 97.9 F (36.6 C), resp. rate 18, height 5\' 8"  (1.727 m), weight 130.8 kg, SpO2 93 %.  Blood pressure 134/78, pulse 76, temperature 98 F (36.7 C), temperature source Oral, resp. rate (!) 25, height 5\' 8"  (1.727 m), weight (!) 167.7 kg, SpO2 95 %. Physical Exam  General: No acute distress, BMI 43.85 Mood and affect are appropriate Heart: Regular rate and rhythm no rubs murmurs or extra sounds Lungs: Clear to auscultation, breathing unlabored, no rales or wheezes Abdomen: Positive bowel sounds, soft nontender to palpation, nondistended  Skin: wound VAC on abd- vertical - looks stable- has good suction Musculoskeletal:     Cervical back: Neck supple. No tenderness.     Comments: Ues 4th/5th digit of L hand flexed at MCP and PIP Ue's 5-/5 in arms except FA 4-/5 B/L   LE's HF 4-/5; KE 4+/5 B/L, DF 2-/5 and PF 2-/5 B/L B/L foot drop  Skin:    General: Skin is warm and dry.     Comments: trace bogginess of B/L heels- dressing on heels for prevention Needs PRAFO's. Healed stage 2 on right heel Has mild MASD between legs, skin folds and  groin, wound with >80% granulation tissue Neurological:     Mental Status: He is alert and oriented to person, place, and time.     Sensory: Sensory deficit present.     Motor: Weakness present.     Comments: Severe neuropathy in legs B/L, finger tips also with reduced sensation  Has absent sensation to light touch to knees B/L- with foot atrophy B/L   Psychiatric:        Mood and Affect: Mood normal.        Behavior: Behavior normal. Bright and motivated        Assessment/Plan: 1. Functional deficits which require 3+ hours per day of interdisciplinary therapy in a comprehensive inpatient rehab setting. Physiatrist is providing close team supervision and 24 hour management of active medical problems listed below. Physiatrist and rehab team continue to assess barriers to discharge/monitor patient progress toward functional and medical goals  Care Tool:  Bathing    Body parts bathed by patient: Right arm, Left arm, Chest, Abdomen, Right upper leg, Left upper leg,  Face   Body parts bathed by helper: Right lower leg, Left lower leg, Front perineal area     Bathing assist Assist Level: Moderate Assistance - Patient 50 - 74%     Upper Body Dressing/Undressing Upper body dressing   What is the patient wearing?: Pull over shirt    Upper body assist Assist Level: Set up assist    Lower Body Dressing/Undressing Lower body dressing      What is the patient wearing?: Pants     Lower body assist Assist for lower body dressing: Minimal Assistance - Patient > 75%     Toileting Toileting    Toileting assist Assist for toileting: Maximal Assistance - Patient 25 - 49%     Transfers Chair/bed transfer  Transfers assist     Chair/bed transfer assist level: Supervision/Verbal cueing     Locomotion Ambulation   Ambulation assist   Ambulation activity did not occur: Safety/medical concerns (fatigue, dizziness, weakness, decreased balance)  Assist level:  Supervision/Verbal cueing Assistive device: Walker-rolling Max distance: 51ft   Walk 10 feet activity   Assist  Walk 10 feet activity did not occur: Safety/medical concerns (fatigue, dizziness, weakness, decreased balance)  Assist level: Supervision/Verbal cueing Assistive device: Walker-rolling   Walk 50 feet activity   Assist Walk 50 feet with 2 turns activity did not occur: Safety/medical concerns (fatigue, dizziness, weakness, decreased balance)         Walk 150 feet activity   Assist Walk 150 feet activity did not occur: Safety/medical concerns (fatigue, dizziness, weakness, decreased balance)         Walk 10 feet on uneven surface  activity   Assist Walk 10 feet on uneven surfaces activity did not occur: Safety/medical concerns (fatigue, dizziness, weakness, decreased balance)         Wheelchair     Assist Is the patient using a wheelchair?: Yes Type of Wheelchair: Manual Wheelchair activity did not occur: Safety/medical concerns (fatigue, weakness)  Wheelchair assist level: Supervision/Verbal cueing Max wheelchair distance: 34ft    Wheelchair 50 feet with 2 turns activity    Assist    Wheelchair 50 feet with 2 turns activity did not occur: Safety/medical concerns (fatigue, weakness)       Wheelchair 150 feet activity     Assist  Wheelchair 150 feet activity did not occur: Safety/medical concerns (fatigue, weakness)       Blood pressure 109/61, pulse 80, temperature 97.9 F (36.6 C), resp. rate 18, height 5\' 8"  (1.727 m), weight 130.8 kg, SpO2 93 %.  Medical Problem List and Plan: 1. Functional deficits secondary to debility from CHF exacerbation             -patient may not shower due to wound vac. Outpatient wound ac ordered.              -ELOS/Goals: 12-16 days -supervision to min A  Grounds pass ordered  Continue CIR- PT and OT  -Interdisciplinary Team Conference today   2.  Impaired mobility: continue Heparin. Add SCDs.   3. Severe lumbar spinal stenosis: Provided pain relief journal. Tylenol, oxycodone 60 mg TID as needed- is home dose             -continue pregablin 100 mg daily- just restarted -1/20- will change Oxycodone to 60 mg TID- since is home regimen-  4. Mood/Behavior/Sleep: LCSW to evaluate and provide emotional support             -continue Cymbalta 60 mg BID             -  antipsychotic agents: n/a   5. Neuropsych/cognition: This patient is capable of making decisions on his own behalf.   6. Abdominal incision: maintain wound vac.    7. Fluids/Electrolytes/Nutrition: Strict Is and Os and follow-up chemistries             -continue Prosource, Boost, iron supplements   8: HFmidrangeEF:             -daily weight             -weight up>>resuming Lasix 80 mg daily on 1/16             -losartan 12.5 mg daily resumed 1/16   Add additional 20mg  Lasix today 9: OSA on CPAP- uses home CPAP                10: Atrial fibrillation: persistent but alternating SR; s/p DCCV             -Eliquis held; general surgery to approve restarting- they said would determine 1/17?             -continue Pacerone 400 mg daily, metoprolol 100 mg daily   11: Class 3 obesity: BMI 56.21 1/16   12: Hypertension: monitor TID and prn             -continue Lasix 80 mg daily             -d/c losartan since currently hypotensive 13: Peripheral neuropathy/lumbar spinal stenosis/chronic pain             -neurologist Dr. Metta Clines -oxycodone 60 mg TID, lorazepam 0.5 mg, pregabalin 100 mg at home per Dr. Gar Ponto. Provided pain relief journal   14: GERD/dyspepsia: Protonix 40 mg q HS             -give Tums before meals   15: BPH: increase Flomax to home dose>> 0.8 mg daily   16: COPD (previous tobacco use): continue Yupelri nebs daily- wheezed at rest with exam exertion- pt refuses Albuterol per chart.    17: Leukocytosis: CT abdomen/pelvis ordered   18: Loose stool: resolved   19. B/L Foot drop due to severe  peripheral neuropathy- has AFO's but needs PRAFOs at bedtime- will order.    20. MASD- at groin and skin folds/between legs- continue Nystatin, add Gerrhard's butt cream  21. Left shoulder pain: XR ordered and shows severe glenohumeral arthritis. Kpad ordered.   22. Dry skin: Eucerin ordered  23. Urinary urgency: purewick ordered for night, discussed with patient that he can stop using this when he feels ready.   24. Mild ileus: nausea resolved, advance to regular diet. continue senna 2 tablets HS.   25. Abdominal wound: continue surgical consultation, appreciate surgery opening up false bottom of wound. Continue wound vac  26. Intertriginous dermatitis: roho cushion ordered.      LOS: 8 days A FACE TO FACE EVALUATION WAS PERFORMED  Martha Clan P Caylie Sandquist 01/16/2023, 11:14 AM

## 2023-01-16 NOTE — Patient Care Conference (Signed)
Inpatient RehabilitationTeam Conference and Plan of Care Update Date: 01/16/2023   Time: 11:44 AM   Patient Name: Timothy Macias      Medical Record Number: [id number]  Date of Birth: 07-17-1961 Sex: Male         Room/Bed: 4W21C/4W21C-01 Payor Info: Payor: [place] / Plan: [employer] COMM PPO / Product Type: *No Product type* /    Admit Date/Time:  01/08/2023  1:09 PM  Primary Diagnosis:  [facility] Problems: Principal Problem:   Debility Active Problems:   Small bowel obstruction ([place])   Acute on chronic congestive heart failure ([place])   Intertriginous dermatitis associated with moisture    Expected Discharge Date: Expected Discharge Date: 01/19/23  Team Members Present: Physician leading conference: Dr. Leeroy Cha Social Worker Present: Erlene Quan, BSW Nurse Present: Dorien Chihuahua, RN PT Present: Becky Sax, PT OT Present: Jennefer Bravo, OT PPS Coordinator present : Gunnar Fusi, SLP     Current Status/Progress Goal Weekly Team Focus  Bowel/Bladder   Pt is continent of bowel/bladder   Pt will continue to be continent of bowel/bladder   Will assess qshift and PRN    Swallow/Nutrition/ Hydration               ADL's   Set up A UB, Min A LB, Min A toileting (uses toileting wand and seamless pants at baseline)   Supervision, min A   DC planning, family ed, functional endurance/strengthening    Mobility   supine<>sit min A, transfers with bari RW CGA, gait 85ft bari RW and CGA   supervision  pain management, functional mobility/transfers, generalized strengthening and endurance, dynamic standing balance/coordination, and gait training.    Communication                Safety/Cognition/ Behavioral Observations               Pain   Pt pain is 6/10   Pt pain is controlled with medication   Will assess qshift and PRN    Skin   Pt has MASD to bottom   Pt's bottom will heal  Will assess qshift and PRN      Discharge  Planning:  Discharging home on Saturday. Wound vac to deliver Friday. SW waiting on [facility] pending insurance establishment.   Team Discussion: Patient continues with wound vac managed by WOC, dermatitis on buttocks healing and mild ileus improving.  Continues use of I.S. Patient on target to meet rehab goals: yes, currently needs supervision -  min assist for sit - stand and close supervision using a RW. Needs set up for upper body care and min assist for lower body care and toileting. Goals for discharge set for supervision overall.   *See Care Plan and progress notes for long and short-term goals.   Revisions to Treatment Plan:  N/a  Teaching Needs: Safety, skin care/wound care, medications, transfers, toileting, etc.   Current Barriers to Discharge: Decreased caregiver support and Wound care  Possible Resolutions to Barriers: Family education HH follow up services including nursing for wound vac DME: Roho cushion     Medical Summary Current Status: wound vac for abdominal wound, sacral dermatitis, morbid obesity, uncontrolled pain, mild ileus  Barriers to Discharge: Medical stability;Morbid Obesity;Complicated Wound;Uncontrolled Pain  Barriers to Discharge Comments: wound vac for abdominal wound, sacral dermatitis, morbid obesity, uncontrolled pain, mild ileus Possible Resolutions to [employer] Focus: continue wound vac with WOC dressing changes, antifungal ordered, Roho cushion ordered, provide dietary education, continue laxatives  Continued Need for Acute Rehabilitation Level of Care: The patient requires daily medical management by a physician with specialized training in physical medicine and rehabilitation for the following reasons: Direction of a multidisciplinary physical rehabilitation program to maximize functional independence : Yes Medical management of patient stability for increased activity during participation in an intensive rehabilitation regime.: Yes Analysis  of laboratory values and/or radiology reports with any subsequent need for medication adjustment and/or medical intervention. : Yes   I attest that I was present, lead the team conference, and concur with the assessment and plan of the team.   Dorien Chihuahua B 01/16/2023, 3:50 PM

## 2023-01-16 NOTE — Progress Notes (Signed)
Physical Therapy Session Note  Patient Details  Name: Timothy Macias MRN: [id number] Date of Birth: 1961/10/17  Today's Date: 01/16/2023 PT Individual Time: 0730-0810 and 1300-1341 PT Individual Time Calculation (min): 40 min and 41 min  Short Term Goals: Week 1:  PT Short Term Goal 1 (Week 1): pt will perform bed mobility with min A PT Short Term Goal 2 (Week 1): pt will transfer sit<>stand with LRAD and min A PT Short Term Goal 3 (Week 1): pt will ambulate 92ft with LRAD and min A  Skilled Therapeutic Interventions/Progress Updates:   Treatment Session 1  Received pt semi-reclined in bed, pt agreeable to PT treatment, and reported pain 9/10 in buttocks and 8/10 in stomach - RN aware and repositioning done to reduce pain. Session with emphasis on functional mobility/transfers, WC mobility, and gait training. Pt transferred semi-reclined<>sitting R EOB with HOB elevated and use of bedrails with mod HHA. Donned bilateral AFO's with total A and pt doffed dirty shirt and donned clean one with supervision. Stood with bari RW and close supervision from elevated EOB and ambulated 41ft with bari RW and CGA fading to close supervision with total A to manage wound vac and WC. Pt limited by fatigue and dizziness and requested to be done with ambulation - BP: 126/90.   Pt then performed WC mobility 56ft x 1 and 65ft x 1 using BUE and BLE with supervision to fatigue with emphasis on UE/LE strength and cardiovasculare endurance. Wound care arrived and requesting pt return to bed at end of session. Transported remainder of way back to room and pt stood with bari RW and CGA and transferred back to bed with close supervision. Transferred sit<>supine with min A for LLE management and concluded session with pt semi-reclined in bed, needs within reach, and bed alarm on.    Treatment Session 2  Received pt sitting in WC, pt agreeable to PT treatment, and reported pain 9/10 in buttocks in abdomen (premedicated).  Session with emphasis on functional mobility/transfers, generalized strengthening and endurance, and gait training. Pt transported to dayroom in [facility] dependently for time management purposes. Pt required momentum, but able to stand from [facility] with bari RW and CGA x 2 trials with therapist stabilizing RW and ambulated 40ft x 1 and 56ft x 1 with bari RW and CGA/close supervision - limited by increased dizziness and fatigue. BP: 139/79 after trial 1 and BP: 115/94 after trial 2. Pt then performed WC mobility 58ft using BUE and BLE to fatigue and transported remainder of way back to room in [facility] dependently. Pt performed 3x10 LAQ, 3x10 hip flexion, and 3x15 hip abduction with red TB with emphasis on LE strength. Concluded session with pt sitting in [facility] with all needs within reach awaiting upcoming OT session.    Therapy Documentation Precautions:  Precautions Precautions: Fall, Other (comment) Precaution Comments: abdominal wound, bilateral AFOs Restrictions Weight Bearing Restrictions: No  Therapy/Group: Individual Therapy Alfonse Alpers PT, DPT  01/16/2023, 7:02 AM

## 2023-01-16 NOTE — Discharge Summary (Signed)
Physician Discharge Summary  Patient ID: Timothy Macias MRN: [id number] DOB/AGE: [birth year] 62 y.o.  Admit date: 01/08/2023 Discharge date: 01/19/2023  Discharge Diagnoses:  Principal Problem:   Debility Active Problems:   Small bowel obstruction (HCC)   Acute on chronic congestive heart failure (HCC)   Intertriginous dermatitis associated with moisture   Open wound anterior abdominal wall Bilateral foot drop Leukocytosis COPD GERD Peripheral neuropathy Lumbar spinal stenosis Hypertension Class 3 obesity Atrial fibrillation OSA Heart failure with midrange EF Acute on CKD   Discharged Condition: stable  Significant Diagnostic Studies:  Labs:  Basic Metabolic Panel: Recent Labs  Lab 01/11/23 0623 01/14/23 0606 01/16/23 0532  NA  --  131* 130*  K  --  3.3* 3.0*  CL  --  93* 94*  CO2  --  29 27  GLUCOSE  --  107* 116*  BUN  --  28* 32*  CREATININE  --  1.44* 1.45*  CALCIUM  --  8.4* 8.2*  MG 1.7  --   --     CBC: Recent Labs  Lab 01/14/23 0606  WBC 9.8  HGB 12.9*  HCT 41.5  MCV 83.7  PLT 346   Family history.  Mother with bone cancer.  Denies any colon cancer esophageal cancer or rectal cancer  Brief HPI:   Timothy Macias is a 62 y.o. male who presented to his PCP on 12/29/2023 complaining of SOB and rapid weight gain with history of HFrEF and atrial fibrillation on Eliquis. Transported via EMS to [facility] ED. Found to be in rapid atrial fibrillation admitted to hospitalist service. Mild hypoxia supplemented with 3L O2 via [place]. Amiodarone infusion started. Labs consistent with AKI, elevated hemoglobin. Episode of emesis with nausea and lack of appetite. Transferred to [facility]. Lethargic and RR called on 1/06. A fib given dig, Lasix and BB. Cardiology consulted. HFrEF 45% secondary to NICM. Notes indicated alcohol intake is 6 beers daily for ~45 years.   Noted abdominal distention and wife stated the patient had undergone incarcerated open umbilical hernia  repair at [facility] in 2023. KUB with dilated SB loops and general surgery consulted. NGT place with 3 liters of output. Eliquis held and CT abdomen and pelvis obtained. RR called 1/07 due to decreased LOC. Transferred to ICU and PCCM consulted. Eliquis held and heparin infusion started. Required cardioversion 1/09. No improvement in SBO with small bowel protocol and underwent exploratory laparotomy with lysis of adhesions and small bowel resection by Dr. Donne Hazel on 1/10. Fascia was closed and wound VAC placed. Remained on vent post-op. Skin edge bleeding vessel required suturing at bedside on 1/12. Heparin infusion stopped and heparin [place] started for DVT prophylaxis. NGT removed and given ice chips on 1/12. Diet advanced to soft. Restarted Lasix 80 mg and losartan. VAC dressing to be changed every M/W/F. To restart Eliquis when approved by surgical team.   Hospital Course: Timothy Macias was admitted to rehab 01/08/2023 for inpatient therapies to consist of PT, ST and OT at least three hours five days a week. Past admission physiatrist, therapy team and rehab RN have worked together to provide customized collaborative inpatient rehab. On 1/17, he reported worsening pain and nausea. Surgery team removed and replaced midline VAC dressing. CT of abdomen and pelvis obtained. No abscess or leak. Surgical team followed-up. Ok to resume Bilateral foot drop noted and PRAFOs ordered. On 1/18, therapy team noted left eye ptosis and CT head obtained without acute findings. Surgery team continued to evaluate at time of [facility]  changes. Flomax increased to 0.8 mg daily. Nausea resolved and advanced to regular diet on 1/19. Changed oxycodone to 60 mg TID as this was his home regimen. Metoprolol held 1/21 then decreased to 50 mg BID on 1/24 due to soft BP. Additional dose of Lasix 20 mg given on 1/23. Potassium supplementation given due to drift in K+ to 3.0 on 1/24. Has had increase in BUN and creatinine to 32/1.45.   Blood  pressures were monitored on TID basis and controlled on Lasix 80 mg daily and losartan 12.5 mg continued. Metoprolol held 1/21 then decreased to 50 mg BID on 1/24 due to soft BP.  Rehab course: During patient's stay in rehab weekly team conferences were held to monitor patient's progress, set goals and discuss barriers to discharge. At admission, patient required max assist with basic self-care skills and mod assist with mobility.  Physical exam.  Blood pressure 134/78 pulse 76 temperature 98 respirations 25 oxygen saturation 95% room air Constitutional.  No acute distress HEENT Head.  Normocephalic and atraumatic Eyes.  Pupils round and reactive to light no discharge without nystagmus Neck.  Supple nontender no JVD without thyromegaly Cardiac regular rate and rhythm without any extra sounds or murmur heard Abdomen.  Wound VAC in place.  Positive bowel sounds Respiratory effort normal no respiratory distress without wheeze Musculoskeletal Upper extremities 5 -/5 in arms except FA 4 -/5B/L Lower extremities hip flexors 4 -/5, knee extension 4+/5B/L, dorsi flexion to minus/5 in plantarflexion to minus/5B/L Skin.  Dry patch on right heel.  Trace bogginess of B/L heels dressing on heels for prevention Neurologic.  Alert oriented.  Severe neuropathy in legs B/L.  Has absent sensation to light touch in knees B/L-with foot atrophy B/L  He has had improvement in activity tolerance, balance, postural control as well as ability to compensate for deficits. He has had improvement in functional use RUE/LUE  and RLE/LLE as well as improvement in awareness. CGA/min A needed to lower to EOB due to scale sliding. Stood again with RW with CGA then supervision stand step transfer to w/c using RW.    Disposition: Discharge to home    Diet: Heart healthy  Special Instructions: No driving, alcohol consumption or tobacco use.  Continue wound VAC changes as directed  Medications at discharge. 1.  Tylenol as  needed 2.  Amiodarone 400 mg p.o. daily 3.  Eliquis 5 mg p.o. twice daily 4.  Vitamin C 1000 mg p.o. daily 5.  Flexeril 5 mg p.o. 3 times daily 6.  Colace 100 mg p.o. 3 times daily 7.  Cymbalta 60 mg p.o. twice daily 8.  Ferrous sulfate 325 mg p.o. daily 9.  Lasix 80 mg p.o. daily 10.  Robaxin 500 mg every 6 hours as needed muscle spasms 11.  Toprol-XL 50 mg p.o. daily 12.  Oxycodone 60 mg p.o. 3 times daily 13.  Protonix 40 mg nightly 14.  MiraLAX daily 15.  Lyrica 100 mg p.o. daily 16.Xopenex nebulizer treatment every 6 hours as needed 17.  Florastor 250 mg p.o. twice daily 18.  Senokot 2 tablets nightly 19.  Flomax 0.8 mg p.o. daily 20.  Vitamin D 50,000 units every 7 days 21.  Potassium chloride 20 meq 2 tabs daily 22.  Folic acid 1 mg daily 23.  Ativan 0.5 mg every 8 hours as needed anxiety 24.  Imitrex 100 mg every 2 hours as needed migraine 25.  Melatonin 5 to 10 mg nightly as needed  30-35 minutes were spent completing  discharge summary and discharge planning      Follow-up Information     Raulkar, Drema Pry, MD Follow up.   Specialty: Physical Medicine and Rehabilitation Why: As needed Contact information: [address] [place] [address] [phone]         Emelia Loron, MD Follow up.   Specialty: General Surgery Why: Call the office in 1-2 days to make arrangements for hospital/post-op follow-up appointment. Contact information: [address] [place] [address] [phone]         Richardean Chimera, MD Follow up.   Specialty: Family Medicine Why: Call the office in 1-2 days to make arrangements for hospital follow-up appointment. Contact information: [address] [place] [address] [phone]         Assar, Rayetta Pigg, DO Follow up.   Specialty: Cardiology Why: Call the office in 1-2 days to make arrangements for hospital follow-up appointment. Contact information: [address] [place]  [address] [phone]                 Signed: Mcarthur Rossetti Dymon Summerhill 01/18/2023, 5:17 AM

## 2023-01-16 NOTE — Progress Notes (Signed)
Occupational Therapy Session Note  Patient Details  Name: Timothy Macias MRN: [id number] Date of Birth: [birth year]  Today's Date: 01/16/2023 OT Individual Time: 1050-1130 OT Individual Time Calculation (min): 40 min    Short Term Goals: Week 1:  OT Short Term Goal 1 (Week 1): Pt will complete sit > stand in prep for ADL with Min A using LRAD OT Short Term Goal 2 (Week 1): Pt will complete toileting with min A using LRAD OT Short Term Goal 3 (Week 1): Pt will complete toilet transfer with CGA using LRAD  Skilled Therapeutic Interventions/Progress Updates:  Skilled OT intervention completed with focus on activity tolerance, functional transfers, BUE endurance. Pt received upright in bed, asleep, requiring increased stimuli to waken. Pt then agreeable to session. 8/10 pain reported in abdomen after wound vac replacement. Pt pre-medicated. OT offered rest breaks and repositioning throughout for pain reduction.  Agreeable to transfer into w/c for light exercises vs ambulation as pt also indicated fatigue. Min A needed for trunk elevation to EOB using HHA for pain management. Nursing requested OT to weigh pt in stance. Using grab bar on standing scale pt was able to stand with CGA and remain standing with supervision; no dizziness reported. Scale indicated 352.7 lbs. CGA/min A needed to lower to EOB due to scale sliding. Stood again with RW with CGA then supervision stand step transfer to w/c using RW.   Did indicate dizziness after seated rest however resolved without intervention and education was provided on avoiding breath holding during transfers and below exercises: (With red theraband) x12 reps Horizontal abduction Self-anchored shoulder flexion each arm (AROM only on LUE 2/2 < ROM) Self-anchored bicep flexion each arm Self-anchored tricep extension each arm Shoulder external rotation -cues needed for form and technique throughout  Nursing made aware of suspected wound vac abnormality  due to lack of "suction" at abdomen and no "sucking" noises per pt. Pt remained seated in w/c, with all needs in reach at end of session.   Therapy Documentation Precautions:  Precautions Precautions: Fall, Other (comment) Precaution Comments: abdominal wound, bilateral AFOs Restrictions Weight Bearing Restrictions: No   Therapy/Group: Individual Therapy  [name], MS, OTR/L  01/16/2023, 11:34 AM

## 2023-01-16 NOTE — Progress Notes (Signed)
Recreational Therapy Session Note  Patient Details  Name: Timothy Macias MRN: [id number] Date of Birth: [birth year] Today's Date: 01/16/2023 No c/o pain  Pt participated in animal assisted activity seated w/c level with supervision.  Pt appreciative of this visit.   [place] 01/16/2023, 1:38 PM

## 2023-01-16 NOTE — Progress Notes (Signed)
Patient ID: Timothy Macias, male   DOB: [birth year], 62 y.o.   MRN: [id number]  Team Conference Report to Patient/Family  Team Conference discussion was reviewed with the patient and caregiver, including goals, any changes in plan of care and target discharge date.  Patient and caregiver express understanding and are in agreement.  The patient has a target discharge date of 01/19/23.  SW met with patient and provided conference updates. Patient has all DME. SW in process of ordering at [facility] cushion for d/c. Sw informed patient of the barrier of establishing nursing for [facility] due to service area frequency, and insurance. SW will continue efforts. If unable to establish [facility], spouse will need to be educated by [facility] or [facility] nursing, which will be determined on Friday. No additional questions or concerns. [name] 01/16/2023, 2:23 PM

## 2023-01-16 NOTE — Discharge Instructions (Addendum)
Inpatient Rehab Discharge Instructions  Timothy Macias Discharge date and time: 01/19/2023   Activities/Precautions/ Functional Status: Activity: no lifting, driving, or strenuous exercise until cleared by MD Diet: cardiac diet Wound Care:  wound VAC care as per general surgery Functional status:  ___ No restrictions     ___ Walk up steps independently ___ 24/7 supervision/assistance   ___ Walk up steps with assistance _x__ Intermittent supervision/assistance  ___ Bathe/dress independently ___ Walk with walker     ___ Bathe/dress with assistance ___ Walk Independently    ___ Shower independently ___ Walk with assistance    __x_ Shower with assistance __x_ No alcohol     ___ Return to work/school ________   COMMUNITY REFERRALS UPON DISCHARGE:    Home Health:   PT     OT     RN                  Agency:[facility]  Phone: [phone]    Medical Equipment/Items Ordered: Roho Cushion (To be delivered to the home)                                                 Agency/Supplier: [employer] [phone]   Special Instructions: No driving, alcohol consumption or tobacco use.          My questions have been answered and I understand these instructions. I will adhere to these goals and the provided educational materials after my discharge from the hospital.  Patient/Caregiver Signature _______________________________ Date __________  Clinician Signature _______________________________________ Date __________  Please bring this form and your medication list with you to all your follow-up doctor's appointments.     [facility], [place] [phone]  OPEN ABDOMINAL SURGERY: POST OP INSTRUCTIONS  Always review your discharge instruction sheet given to you by the facility where your surgery was performed.  IF YOU HAVE DISABILITY OR FAMILY LEAVE FORMS, YOU MUST BRING THEM TO THE OFFICE FOR PROCESSING.  PLEASE DO NOT GIVE THEM TO YOUR DOCTOR.  A prescription for  pain medication may be given to you upon discharge.  Take your pain medication as prescribed, if needed.  If narcotic pain medicine is not needed, then you may take acetaminophen (Tylenol) or ibuprofen (Advil) as needed. Take your usually prescribed medications unless otherwise directed. If you need a refill on your pain medication, please contact your pharmacy. They will contact our office to request authorization.  Prescriptions will not be filled after 5pm or on week-ends. You should follow a light diet the first few days after arrival home, such as soup and crackers, pudding, etc.unless your doctor has advised otherwise. A high-fiber, low fat diet can be resumed as tolerated.   Be sure to include lots of fluids daily. Most patients will experience some swelling and bruising on the chest and neck area.  Ice packs will help.  Swelling and bruising can take several days to resolve Most patients will experience some swelling and bruising in the area of the incision. Ice pack will help. Swelling and bruising can take several days to resolve..  It is common to experience some constipation if taking pain medication after surgery.  Increasing fluid intake and taking a stool softener will usually help or prevent this problem from occurring.  A mild laxative (Milk of Magnesia or Miralax) should be taken according to  package directions if there are no bowel movements after 48 hours.  You may have steri-strips (small skin tapes) in place directly over the incision.  These strips should be left on the skin for 7-10 days.  If your surgeon used skin glue on the incision, you may shower in 24 hours.  The glue will flake off over the next 2-3 weeks.  Any sutures or staples will be removed at the office during your follow-up visit. You may find that a light gauze bandage over your incision may keep your staples from being rubbed or pulled. You may shower and replace the bandage daily. ACTIVITIES:  You may resume regular  (light) daily activities beginning the next day--such as daily self-care, walking, climbing stairs--gradually increasing activities as tolerated.  You may have sexual intercourse when it is comfortable.  Refrain from any heavy lifting or straining until approved by your doctor. You may drive when you no longer are taking prescription pain medication, you can comfortably wear a seatbelt, and you can safely maneuver your car and apply brakes Return to Work: ___________________________________ [name] should see your doctor in the office for a follow-up appointment approximately two weeks after your surgery.  Make sure that you call for this appointment within a day or two after you arrive home to insure a convenient appointment time. OTHER INSTRUCTIONS:  _____________________________________________________________ _____________________________________________________________  WHEN TO CALL YOUR DOCTOR: Fever over 101.0 Inability to urinate Nausea and/or vomiting Extreme swelling or bruising Continued bleeding from incision. Increased pain, redness, or drainage from the incision. Difficulty swallowing or breathing Muscle cramping or spasms. Numbness or tingling in hands or feet or around lips.  The clinic staff is available to answer your questions during regular business hours.  Please don't hesitate to call and ask to speak to one of the nurses if you have concerns.  For further questions, please visit [email or web address]

## 2023-01-16 NOTE — Progress Notes (Signed)
Pt has home CPAP at bedside.  

## 2023-01-17 MED ORDER — SENNA 8.6 MG PO TABS: 2.0000 | ORAL_TABLET | Freq: Every day | ORAL | 0 refills | Status: AC

## 2023-01-17 MED ORDER — CYCLOBENZAPRINE HCL 5 MG PO TABS: 5.0000 mg | ORAL_TABLET | Freq: Three times a day (TID) | ORAL | 0 refills | Status: AC

## 2023-01-17 MED ORDER — VITAMIN D (ERGOCALCIFEROL) 1.25 MG (50000 UNIT) PO CAPS: 50000.0000 [IU] | ORAL_CAPSULE | ORAL | 0 refills | Status: AC

## 2023-01-17 MED ORDER — OXYCODONE HCL 30 MG PO TABS: 60.0000 mg | ORAL_TABLET | Freq: Three times a day (TID) | ORAL | 0 refills | Status: AC

## 2023-01-17 MED ORDER — METOPROLOL SUCCINATE ER 50 MG PO TB24: 50.0000 mg | ORAL_TABLET | Freq: Every day | ORAL | 0 refills | Status: AC

## 2023-01-17 MED ORDER — ACETAMINOPHEN 325 MG PO TABS: 325.0000 mg | ORAL_TABLET | ORAL | Status: AC | PRN

## 2023-01-17 MED ORDER — SACCHAROMYCES BOULARDII 250 MG PO CAPS: 250.0000 mg | ORAL_CAPSULE | Freq: Two times a day (BID) | ORAL | 0 refills | Status: AC

## 2023-01-17 MED ORDER — POLYETHYLENE GLYCOL 3350 17 G PO PACK: 17.0000 g | PACK | Freq: Every day | ORAL | 0 refills | Status: AC

## 2023-01-17 MED ORDER — POTASSIUM CHLORIDE CRYS ER 20 MEQ PO TBCR: 20.0000 meq | EXTENDED_RELEASE_TABLET | Freq: Two times a day (BID) | ORAL | 0 refills | Status: AC

## 2023-01-17 MED ORDER — METHOCARBAMOL 500 MG PO TABS: 500.0000 mg | ORAL_TABLET | Freq: Four times a day (QID) | ORAL | 0 refills | Status: AC | PRN

## 2023-01-17 MED ORDER — APIXABAN 5 MG PO TABS: 5.0000 mg | ORAL_TABLET | Freq: Two times a day (BID) | ORAL | 0 refills | Status: AC

## 2023-01-17 MED ORDER — ASCORBIC ACID 1000 MG PO TABS: 1000.0000 mg | ORAL_TABLET | Freq: Every day | ORAL | Status: AC

## 2023-01-17 MED ORDER — TAMSULOSIN HCL 0.4 MG PO CAPS: 0.8000 mg | ORAL_CAPSULE | Freq: Every day | ORAL | 0 refills | Status: AC

## 2023-01-17 NOTE — Progress Notes (Signed)
Physical Therapy Session Note  Patient Details  Name: Timothy Macias MRN: [id number] Date of Birth: 1961-01-03  Today's Date: 01/17/2023 PT Individual Time: 1330-1430 PT Individual Time Calculation (min): 60 min   Short Term Goals: Week 1:  PT Short Term Goal 1 (Week 1): pt will perform bed mobility with min A PT Short Term Goal 2 (Week 1): pt will transfer sit<>stand with LRAD and min A PT Short Term Goal 3 (Week 1): pt will ambulate 23ft with LRAD and min A  Skilled Therapeutic Interventions/Progress Updates:    Pt seated in w/c on arrival and agreeable to therapy. No complaint of pain. Pt propelled w/c with BUE/BLE for "warm up" prior to gait training. Pt ambulated with  bari RW and CGA, w/c follow in case of dizziness or fatigue. Pt ambulated x 25 ft, x 30 ft, x 35 ft. [place] hunched posture and short step length. Dizziness brought on after first bout, but did not worsen after following bouts. Vitals stable. Pt required extended seated rest breaks for fatigue and dizziness management. Pt also participated in retrieving object off the floor with reacher and RW. Pt was able to so when attempting to retrieve appropriate object for reacher. Pt returned to room and performed Stand pivot transfer to bed with CGA and mod A for BLE management sit>supine. Pt remained in bed and was left with all needs in reach and alarm active.   Therapy Documentation Precautions:  Precautions Precautions: Fall, Other (comment) Precaution Comments: abdominal wound, bilateral AFOs Restrictions Weight Bearing Restrictions: No General:      Therapy/Group: Individual Therapy  Mickel Fuchs 01/17/2023, 3:37 PM

## 2023-01-17 NOTE — Progress Notes (Signed)
Patient ID: Timothy Macias, male   DOB: 1961-10-09, 62 y.o.   MRN: [id number]  Wound Vac delivered to patients room by writer.

## 2023-01-17 NOTE — Progress Notes (Signed)
PROGRESS NOTE   Subjective/Complaints: Now having loose stool, discussed stopping colace and he is agreeable. He would like to keep senna Weight up again today, BP soft  ROS:   Pt denies SOB, CP, and vision changes, +abdominal pain, +pain from sacral pressure injury, denies constipation, +loose stool Objective:   No results found. No results for input(s): "WBC", "HGB", "HCT", "PLT" in the last 72 hours.  Recent Labs    01/16/23 0532  NA 130*  K 3.0*  CL 94*  CO2 27  GLUCOSE 116*  BUN 32*  CREATININE 1.45*  CALCIUM 8.2*     Intake/Output Summary (Last 24 hours) at 01/17/2023 1401 Last data filed at 01/17/2023 1300 Gross per 24 hour  Intake 1120 ml  Output 3750 ml  Net -2630 ml        Physical Exam: Vital Signs Blood pressure 99/67, pulse 94, temperature 97.9 F (36.6 C), temperature source Oral, resp. rate 18, height 5\' 8"  (1.727 m), weight (!) 163.8 kg, SpO2 97 %.  . Physical Exam  General: No acute distress, BMI 43.85 Mood and affect are appropriate Heart: Regular rate and rhythm no rubs murmurs or extra sounds Lungs: Clear to auscultation, breathing unlabored, no rales or wheezes Abdomen: Positive bowel sounds, soft nontender to palpation, nondistended  Skin: intertriginous dermatitis of sacrum, wound VAC on abd- vertical - looks stable- has good suction Musculoskeletal:     Cervical back: Neck supple. No tenderness.     Comments: Ues 4th/5th digit of L hand flexed at MCP and PIP Ue's 5-/5 in arms except FA 4-/5 B/L   LE's HF 4-/5; KE 4+/5 B/L, DF 2-/5 and PF 2-/5 B/L B/L foot drop  Skin:    General: Skin is warm and dry.     Comments: trace bogginess of B/L heels- dressing on heels for prevention Needs PRAFO's. Healed stage 2 on right heel Has mild MASD between legs, skin folds and groin, wound with >80% granulation tissue Neurological:     Mental Status: He is alert and oriented to person,  place, and time.     Sensory: Sensory deficit present.     Motor: Weakness present.     Comments: Severe neuropathy in legs B/L, finger tips also with reduced sensation  Has absent sensation to light touch to knees B/L- with foot atrophy B/L   Psychiatric:        Mood and Affect: Mood normal.        Behavior: Behavior normal. Bright and motivated        Assessment/Plan: 1. Functional deficits which require 3+ hours per day of interdisciplinary therapy in a comprehensive inpatient rehab setting. Physiatrist is providing close team supervision and 24 hour management of active medical problems listed below. Physiatrist and rehab team continue to assess barriers to discharge/monitor patient progress toward functional and medical goals  Care Tool:  Bathing    Body parts bathed by patient: Right arm, Left arm, Chest, Abdomen, Right upper leg, Left upper leg, Face   Body parts bathed by helper: Front perineal area, Buttocks, Right lower leg, Left lower leg     Bathing assist Assist Level: Moderate Assistance - Patient 50 - 74%  Upper Body Dressing/Undressing Upper body dressing   What is the patient wearing?: Pull over shirt    Upper body assist Assist Level: Set up assist    Lower Body Dressing/Undressing Lower body dressing      What is the patient wearing?: Pants (dons over head)     Lower body assist Assist for lower body dressing: Supervision/Verbal cueing     Toileting Toileting    Toileting assist Assist for toileting: Maximal Assistance - Patient 25 - 49%     Transfers Chair/bed transfer  Transfers assist     Chair/bed transfer assist level: Supervision/Verbal cueing     Locomotion Ambulation   Ambulation assist   Ambulation activity did not occur: Safety/medical concerns (fatigue, dizziness, weakness, decreased balance)  Assist level: Supervision/Verbal cueing Assistive device: Walker-rolling Max distance: 82ft   Walk 10 feet  activity   Assist  Walk 10 feet activity did not occur: Safety/medical concerns (fatigue, dizziness, weakness, decreased balance)  Assist level: Supervision/Verbal cueing Assistive device: Walker-rolling   Walk 50 feet activity   Assist Walk 50 feet with 2 turns activity did not occur: Safety/medical concerns (fatigue, dizziness, weakness, decreased balance)         Walk 150 feet activity   Assist Walk 150 feet activity did not occur: Safety/medical concerns (fatigue, dizziness, weakness, decreased balance)         Walk 10 feet on uneven surface  activity   Assist Walk 10 feet on uneven surfaces activity did not occur: Safety/medical concerns (fatigue, dizziness, weakness, decreased balance)         Wheelchair     Assist Is the patient using a wheelchair?: Yes Type of Wheelchair: Manual Wheelchair activity did not occur: Safety/medical concerns (fatigue, weakness)  Wheelchair assist level: Supervision/Verbal cueing Max wheelchair distance: 62ft    Wheelchair 50 feet with 2 turns activity    Assist    Wheelchair 50 feet with 2 turns activity did not occur: Safety/medical concerns (fatigue, weakness)   Assist Level: Supervision/Verbal cueing   Wheelchair 150 feet activity     Assist  Wheelchair 150 feet activity did not occur: Safety/medical concerns (fatigue, weakness)       Blood pressure 99/67, pulse 94, temperature 97.9 F (36.6 C), temperature source Oral, resp. rate 18, height 5\' 8"  (1.727 m), weight (!) 163.8 kg, SpO2 97 %.  Medical Problem List and Plan: 1. Functional deficits secondary to debility from CHF exacerbation             -patient may not shower due to wound vac. Outpatient wound ac ordered.              -ELOS/Goals: 12-16 days -supervision to min A  Grounds pass ordered  Continue CIR- PT and OT  -Interdisciplinary Team Conference today   2.  Impaired mobility: continue Heparin. Add SCDs.  3. Severe lumbar spinal  stenosis: Provided pain relief journal. Tylenol, oxycodone 60 mg TID as needed- is home dose             -continue pregablin 100 mg daily- just restarted -1/20- will change Oxycodone to 60 mg TID- since is home regimen-  4. Mood/Behavior/Sleep: LCSW to evaluate and provide emotional support             -continue Cymbalta 60 mg BID             -antipsychotic agents: n/a   5. Neuropsych/cognition: This patient is capable of making decisions on his own behalf.   6. Abdominal incision:  maintain wound vac.    7. Fluids/Electrolytes/Nutrition: Strict Is and Os and follow-up chemistries             -continue Prosource, Boost, iron supplements   8: HFmidrangeEF:             -daily weight             -weight up>>resuming Lasix 80 mg daily on 1/16             -losartan 12.5 mg daily resumed 1/16   Add additional 20mg  Lasix today 9: OSA on CPAP- uses home CPAP                10: Atrial fibrillation: persistent but alternating SR; s/p DCCV             -Eliquis held; general surgery to approve restarting- they said would determine 1/17?             -continue Pacerone 400 mg daily, metoprolol 100 mg daily   11: Class 3 obesity: BMI 56.21 1/16   12: Hypertension: monitor TID and prn             -continue Lasix 80 mg daily             -d/c losartan since currently hypotensive 13: Peripheral neuropathy/lumbar spinal stenosis/chronic pain             -neurologist Dr. 2/16 -oxycodone 60 mg TID, lorazepam 0.5 mg, pregabalin 100 mg at home per Dr. Shon Millet. Provided pain relief journal   14: GERD/dyspepsia: Protonix 40 mg q HS             -give Tums before meals   15: BPH: increase Flomax to home dose>> 0.8 mg daily   16: COPD (previous tobacco use): continue Yupelri nebs daily- wheezed at rest with exam exertion- pt refuses Albuterol per chart.    17: Leukocytosis: CT abdomen/pelvis ordered   18: Loose stool: d/c colace.   19. B/L Foot drop due to severe peripheral neuropathy- has  AFO's but needs PRAFOs at bedtime- ordered   20. MASD- at groin and skin folds/between legs- continue Nystatin, add Gerrhard's butt cream  21. Left shoulder pain: XR ordered and shows severe glenohumeral arthritis. Kpad ordered.   22. Dry skin: Eucerin ordered  23. Urinary urgency: purewick ordered for night, discussed with patient that he can stop using this when he feels ready.   24. Mild ileus: nausea resolved, advance to regular diet. continue senna 2 tablets HS.   25. Abdominal wound: continue surgical consultation, appreciate surgery opening up false bottom of wound. continue wound vac  26. Intertriginous dermatitis: roho cushion ordered.   27. Hypokalemia: supplement K+.     LOS: 9 days A FACE TO FACE EVALUATION WAS PERFORMED  Donzetta Sprung P Akeema Broder 01/17/2023, 2:01 PM

## 2023-01-17 NOTE — Progress Notes (Signed)
Physical Therapy Session Note  Patient Details  Name: Timothy Macias MRN: [id number] Date of Birth: Mar 20, 1961  Today's Date: 01/17/2023 PT Individual Time: 1000-1056 PT Individual Time Calculation (min): 56 min   Short Term Goals: Week 1:  PT Short Term Goal 1 (Week 1): pt will perform bed mobility with min A PT Short Term Goal 2 (Week 1): pt will transfer sit<>stand with LRAD and min A PT Short Term Goal 3 (Week 1): pt will ambulate 18ft with LRAD and min A  Skilled Therapeutic Interventions/Progress Updates:   Received pt sitting in WC, pt agreeable to PT treatment, and reported pain 10/10 in buttocks and abdomen - encouraged returning to bed at end of session and laying on side for pressure relief, however pt insisted on staying up for lunch. Recreational therapist present during session. Session with emphasis on functional mobility/transfers, generalized strengthening and endurance, and simulated car transfers. Went through pain interference questionnaire, sensation, and MMT. Pt performed WC mobility 3ft using BUE/LE and supervision to fatigue, then transported remainder of way to ortho gym dependently - emphasis on strength and cardiovascular endurance.  Discussed car transfer and pt reports having a low car seat height but verbalized steps he takes to safely get in/out of car. Pt stood with bari RW and close supervision and transferred to car seat with CGA (to hold brief up) but otherwise supervision. Pt unable to get LEs into car due to lack of space on floorboard on rehab car but reports he will have much more leg room in his car and that is wife and papa will be able to assist getting his legs in. Increased time spent discussing power WC options. Explained that since pt received new manual WC 3 years ago, he currently does not qualify for a new chair until the 5 year mark - will provide pt with contact information for [employer].  Transported back to room in [facility] dependently and  briefly reviewed pressure relief strategies (lateral/anterior leans and WC push ups). Concluded session with pt sitting in [facility] with all needs within reach and PA present at bedside.   Therapy Documentation Precautions:  Precautions Precautions: Fall, Other (comment) Precaution Comments: abdominal wound, bilateral AFOs Restrictions Weight Bearing Restrictions: No  Therapy/Group: Individual Therapy Alfonse Alpers PT, DPT  01/17/2023, 7:09 AM

## 2023-01-17 NOTE — Progress Notes (Addendum)
Patient ID: Timothy Macias, male   DOB: 05-28-1961, 62 y.o.   MRN: [id number]  Patient approved by [facility] [place] on Monday 1/29. Orders sent.

## 2023-01-17 NOTE — Progress Notes (Signed)
Occupational Therapy Session Note  Patient Details  Name: Timothy Macias MRN: [id number] Date of Birth: February 23, 1961  Today's Date: 01/17/2023 OT Individual Time: 0800-0910 OT Individual Time Calculation (min): 70 min    Short Term Goals: Week 1:  OT Short Term Goal 1 (Week 1): Pt will complete sit > stand in prep for ADL with Min A using LRAD OT Short Term Goal 2 (Week 1): Pt will complete toileting with min A using LRAD OT Short Term Goal 3 (Week 1): Pt will complete toilet transfer with CGA using LRAD Week 2:     Skilled Therapeutic Interventions/Progress Updates:    1:1 Pt received in bed and reported his night was good but his morning was rough with washing his buttocks after BM due to having raw skin. Pt able to come to EOB with contact guard with HOB elevated and bed rail with A to bring over left shoulder to be able to reach rail- difficulty due to buttock pain this am. Sitting at EOB Pt assisted with total A to don AFO/ shoes. Pt donned cut out shorts over head with setup. Pt able to perform sit to stand with heavy duty RW with supervision with bed slightly elevated (like home) for OT to apply cream to buttocks. Pt transferred with RW to w/c with supervision and finished washing at the sink- A for washing under arm thoroughly. Pt performed oral care I. Pt reports frustration with hands and overall declining strength in bilateral hands.  Pt presented with functional tasks to work on hands; ie crumpling paper, stapling paper and added beads to yellow theraputty  and throwing paper - more gross movement.   Left sitting up in chair with activities.   Therapy Documentation Precautions:  Precautions Precautions: Fall, Other (comment) Precaution Comments: abdominal wound, bilateral AFOs Restrictions Weight Bearing Restrictions: No  Pain: Pt reports pain on buttocks from cleansing him this am from incontinent bowel movement; apply Gerharts butt cream and new foam dressing and pt  reported that helped   Therapy/Group: Individual Therapy  [name] [facility] 01/17/2023, 9:19 AM

## 2023-01-18 LAB — BASIC METABOLIC PANEL
Anion gap: 9 (ref 5–15)
BUN: 35 mg/dL — ABNORMAL HIGH (ref 8–23)
CO2: 28 mmol/L (ref 22–32)
Calcium: 8.8 mg/dL — ABNORMAL LOW (ref 8.9–10.3)
Chloride: 95 mmol/L — ABNORMAL LOW (ref 98–111)
Creatinine, Ser: 1.3 mg/dL — ABNORMAL HIGH (ref 0.61–1.24)
GFR, Estimated: >60 mL/min (ref >60)
Glucose, Bld: 109 mg/dL — ABNORMAL HIGH (ref 70–99)
Potassium: 3.7 mmol/L (ref 3.5–5.1)
Sodium: 132 mmol/L — ABNORMAL LOW (ref 135–145)

## 2023-01-18 NOTE — Progress Notes (Signed)
Inpatient Rehabilitation Discharge Medication Review by a Pharmacist  A complete drug regimen review was completed for this patient to identify any potential clinically significant medication issues.  High Risk Drug Classes Is patient taking? Indication by Medication  Antipsychotic No   Anticoagulant Yes Apixaban - atrial fibrillation/stroke ppx  Antibiotic No   Opioid Yes Oxycodone - PRN pain  Antiplatelet No   Hypoglycemics/insulin No   Vasoactive Medication Yes Metoprolol, amiodarone - atrial fibrillation Furosemide - edema/CHF  Chemotherapy No   Other Yes Vit C, Kcl, Vit D, Folic acid, MVI - supplement Methocarbamol, cyclobenzaprine - PRN muscle spasms Miralax, senokot - constipation Florastor - probiotic Tamsulosin - urinary retention Duloxetine - mood/neuropathy Levalbuterol - SOB Lorazepam - mood/anxiety Melatonin - PRN sleep Pantoprazole - GERD ppx Pregabalin - neuropathy Sumatriptan - migraine tx     Type of Medication Issue Identified Description of Issue Recommendation(s)  Drug Interaction(s) (clinically significant)     Duplicate Therapy     Allergy     No Medication Administration End Date     Incorrect Dose     Additional Drug Therapy Needed     Significant med changes from prior encounter (inform family/care partners about these prior to discharge). Losartan, promethazine discontinued  Tamsulosin increased 0.4mg  > 0.8mg  Communicate medication changes with patient/family at discharge  Other       Clinically significant medication issues were identified that warrant physician communication and completion of prescribed/recommended actions by midnight of the next day:  No  Pharmacist comments: n/a  Time spent performing this drug regimen review (minutes): 20  Thank you for allowing pharmacy to be a part of this patient's care.  Ardyth Harps, PharmD Clinical Pharmacist

## 2023-01-18 NOTE — Progress Notes (Signed)
Recreational Therapy Session Note  Patient Details  Name: Timothy Macias MRN: [id number] Date of Birth: 01/17/1961 Today's Date: 01/18/2023  Pain: c/o of buttocks pain, repositioning himself in w/c, declined getting back to bed Skilled Therapeutic Interventions/Progress Updates: Session focused on leisure education and discharge planning.  Reviewed activity analysis, importance of social, emotional and spiritual health and its role in continued physical health.  Pt is anxious to return home. [place] 01/18/2023, 1:46 PM

## 2023-01-18 NOTE — Plan of Care (Signed)
  Problem: RH Bathing Goal: LTG Patient will bathe all body parts with assist levels (OT) Description: LTG: Patient will bathe all body parts with assist levels (OT) Outcome: Not Met (add Reason) Flowsheets (Taken 01/18/2023 1531) LTG: Pt will perform bathing with assistance level/cueing: (not met due to pt dependence on wife's prior level of assist, body habitus, and general endurance deficits) --   Problem: RH Toileting Goal: LTG Patient will perform toileting task (3/3 steps) with assistance level (OT) Description: LTG: Patient will perform toileting task (3/3 steps) with assistance level (OT)  Outcome: Not Met (add Reason) Flowsheets (Taken 01/18/2023 1531) LTG: Pt will perform toileting task (3/3 steps) with assistance level: (not met due to pt dependence on wife's prior level of assist, body habitus, and general endurance deficits) --   Problem: RH Balance Goal: LTG Patient will maintain dynamic standing with ADLs (OT) Description: LTG:  Patient will maintain dynamic standing balance with assist during activities of daily living (OT)  Outcome: Completed/Met   Problem: Sit to Stand Goal: LTG:  Patient will perform sit to stand in prep for activites of daily living with assistance level (OT) Description: LTG:  Patient will perform sit to stand in prep for activites of daily living with assistance level (OT) Outcome: Completed/Met   Problem: RH Dressing Goal: LTG Patient will perform upper body dressing (OT) Description: LTG Patient will perform upper body dressing with assist, with/without cues (OT). Outcome: Completed/Met Goal: LTG Patient will perform lower body dressing w/assist (OT) Description: LTG: Patient will perform lower body dressing with assist, with/without cues in positioning using equipment (OT) Outcome: Completed/Met   Problem: RH Functional Use of Upper Extremity Goal: LTG Patient will use RT/LT upper extremity as a (OT) Description: LTG: Patient will use  right/left upper extremity as a stabilizer/gross assist/diminished/nondominant/dominant level with assist, with/without cues during functional activity (OT) Outcome: Completed/Met   Problem: RH Simple Meal Prep Goal: LTG Patient will perform simple meal prep w/assist (OT) Description: LTG: Patient will perform simple meal prep with assistance, with/without cues (OT). Outcome: Completed/Met   Problem: RH Toilet Transfers Goal: LTG Patient will perform toilet transfers w/assist (OT) Description: LTG: Patient will perform toilet transfers with assist, with/without cues using equipment (OT) Outcome: Completed/Met

## 2023-01-18 NOTE — Progress Notes (Signed)
Occupational Therapy Session Note  Patient Details  Name: Timothy Macias MRN: [id number] Date of Birth: [birth year]  Today's Date: 01/18/2023 OT Individual Time: 9379-0240 & 9735-3299 OT Individual Time Calculation (min): 58 min & 70 min   Short Term Goals: Week 1:  OT Short Term Goal 1 (Week 1): Pt will complete sit > stand in prep for ADL with Min A using LRAD OT Short Term Goal 2 (Week 1): Pt will complete toileting with min A using LRAD OT Short Term Goal 3 (Week 1): Pt will complete toilet transfer with CGA using LRAD  Skilled Therapeutic Interventions/Progress Updates:  Session 1 Skilled OT intervention completed with focus on activity tolerance, toileting needs. Pt received upright in bed with NT assisting with urinal placement, agreeable to session. 8/10 generalized pain reported; pre-medicated. OT offered rest breaks, repositioning throughout for pain reduction.  Pt expressed fatigue with polite request to modify as much as possible this session and then attempt in PM session for more activity. Pt transitioned to EOB with heavy use of bed rails and HOB elevated with overall min A due to pain but normally does with supervision. Frequent rest breaks needed throughout prior/during transitions. CGA sit > stand using bari walker, then CGA short ambulatory transfer to w/c.  Transported in w/c <> gym for time. Seated in w/c, pt completed shoulder horizontal abduction and bilateral bicep flexion with red theraband for BUE endurance, then abruptly reported urge for BM and rectal pain.   Back in room, OT dependently transported w/c into bathroom for urgency, supervision sit > stand and stand pivot with bari walker and only assist provided for wound vac management to [facility] over toilet. Assist needed for doffing brief down and seamless pants up, then supervision descent to commode. Continent of void and large BM (charted) with increased time. Pt was able to reposition himself with a mini squat for  directing his penis down into commode hole for void vs having to use "bucket method like at home." Mod A needed for posterior peri-hygiene and donning of brief over hips. CGA stand pivot to w/c with increased fatigue.  Pt remained seated in w/c, with all needs in reach at end of session.  Session 2 Skilled OT intervention completed with focus on BUE endurance, cognitive problem solving and visual perceptual skills. Pt received seated in w/c, agreeable to session. 8/10 generalized pain reported; pre-medicated. OT offered rest breaks, repositioning.  Pt declined self-care needs. Transported dependently in w/c > gym for energy conservation. Pt requesting to review BUE exercises due to toileting interruption in AM session. Completed the following with HEP issued for continued strengthening of BUE needed for UE support on RW during functional transfers and BADLs: (With red theraband) x15 reps Horizontal abduction Self-anchored shoulder flexion each arm (without band due to pain/ROM limit) Self-anchored bicep flexion each arm Self-anchored tricep extension each arm Therapist anchored scapular retraction each arm Alternating chest presses Shoulder external rotation Shoulder extension Shoulder diagonal pulls  Transported to BITS, then completed the following assessments to address visual perceptual skills, anterior weight shifting needed for powering up into stance and higher level cog/problem solving: -Bells cancellation assessment- 5 misses (all on the R side in the middle), 2:55 sec to complete -Trail making A- 1.20 sec, 1 error, self-corrected -Trail making B- 3.01, 2 errors with min A needed to recognize mistake -Sequence memory task- 66.67% accuracy   Back in room, OT assisted pt with urinal placement for seated void. Then remained seated in w/c with family present  for nursing care education on wound vac, with all needs in reach at end of session.   Therapy Documentation Precautions:   Precautions Precautions: Fall, Other (comment) Precaution Comments: abdominal wound, bilateral AFOs Required Braces or Orthoses: Other Brace Other Brace: bilat AFO's Restrictions Weight Bearing Restrictions: No    Therapy/Group: Individual Therapy  [name], MS, OTR/L  01/18/2023, 3:31 PM

## 2023-01-18 NOTE — Progress Notes (Signed)
Patient ID: Timothy Macias, male   DOB: [birth year], 62 y.o.   MRN: [id number]  Sw met with pt, spouse and father in room to discuss d/c and address questions or concerns. Patient spouse requesting edu on wound vac, nursing informed and completed. Spouse informed HH established with [facility] and Wound Vac will be delivered to the home. No additional questions or concerns, ready for d/c.

## 2023-01-18 NOTE — Progress Notes (Addendum)
PROGRESS NOTE   Subjective/Complaints: +pain all over, feels like it is muscle soreness from working hard with therapy yesterday Loose stools resolved  ROS:   Pt denies SOB, CP, and vision changes, +abdominal pain, +pain from sacral pressure injury, denies constipation, loose stools resolved  Objective:   No results found. No results for input(s): "WBC", "HGB", "HCT", "PLT" in the last 72 hours.  Recent Labs    01/16/23 0532  NA 130*  K 3.0*  CL 94*  CO2 27  GLUCOSE 116*  BUN 32*  CREATININE 1.45*  CALCIUM 8.2*     Intake/Output Summary (Last 24 hours) at 01/18/2023 1039 Last data filed at 01/18/2023 1022 Gross per 24 hour  Intake 877 ml  Output 4025 ml  Net -3148 ml        Physical Exam: Vital Signs Blood pressure (!) 101/55, pulse 74, temperature 98.1 F (36.7 C), temperature source Oral, resp. rate 17, height 5\' 8"  (1.727 m), weight (!) 162.9 kg, SpO2 92 %.  . Physical Exam  General: No acute distress, BMI 54.61 Mood and affect are appropriate Heart: Regular rate and rhythm no rubs murmurs or extra sounds Lungs: Clear to auscultation, breathing unlabored, no rales or wheezes Abdomen: Positive bowel sounds, soft nontender to palpation, nondistended  Skin: intertriginous dermatitis of sacrum, wound VAC on abd- vertical - looks stable- has good suction Musculoskeletal:     Cervical back: Neck supple. No tenderness.     Comments: Ues 4th/5th digit of L hand flexed at MCP and PIP Ue's 5-/5 in arms except FA 4-/5 B/L   LE's HF 4-/5; KE 4+/5 B/L, DF 2-/5 and PF 2-/5 B/L B/L foot drop  Skin:    General: Skin is warm and dry.     Comments: trace bogginess of B/L heels- dressing on heels for prevention Needs PRAFO's. Healed stage 2 on right heel Has mild MASD between legs, skin folds and groin, wound with >80% granulation tissue Neurological:     Mental Status: He is alert and oriented to person,  place, and time.     Sensory: Sensory deficit present.     Motor: Weakness present.     Comments: Severe neuropathy in legs B/L, finger tips also with reduced sensation  Has absent sensation to light touch to knees B/L- with foot atrophy B/L   Psychiatric:        Mood and Affect: Mood normal.        Behavior: Behavior normal. Bright and motivated        Assessment/Plan: 1. Functional deficits which require 3+ hours per day of interdisciplinary therapy in a comprehensive inpatient rehab setting. Physiatrist is providing close team supervision and 24 hour management of active medical problems listed below. Physiatrist and rehab team continue to assess barriers to discharge/monitor patient progress toward functional and medical goals  Care Tool:  Bathing    Body parts bathed by patient: Right arm, Left arm, Chest, Abdomen, Right upper leg, Left upper leg, Face   Body parts bathed by helper: Front perineal area, Buttocks, Right lower leg, Left lower leg     Bathing assist Assist Level: Moderate Assistance - Patient 50 - 74%  Upper Body Dressing/Undressing Upper body dressing   What is the patient wearing?: Pull over shirt    Upper body assist Assist Level: Set up assist    Lower Body Dressing/Undressing Lower body dressing      What is the patient wearing?: Pants (dons over head)     Lower body assist Assist for lower body dressing: Supervision/Verbal cueing     Toileting Toileting    Toileting assist Assist for toileting: Maximal Assistance - Patient 25 - 49%     Transfers Chair/bed transfer  Transfers assist     Chair/bed transfer assist level: Supervision/Verbal cueing     Locomotion Ambulation   Ambulation assist   Ambulation activity did not occur: Safety/medical concerns (fatigue, dizziness, weakness, decreased balance)  Assist level: Supervision/Verbal cueing Assistive device: Walker-rolling Max distance: 41ft   Walk 10 feet  activity   Assist  Walk 10 feet activity did not occur: Safety/medical concerns (fatigue, dizziness, weakness, decreased balance)  Assist level: Supervision/Verbal cueing Assistive device: Walker-rolling   Walk 50 feet activity   Assist Walk 50 feet with 2 turns activity did not occur: Safety/medical concerns (pain, weakness, dizziness)         Walk 150 feet activity   Assist Walk 150 feet activity did not occur: Safety/medical concerns (pain, weakness, dizziness)         Walk 10 feet on uneven surface  activity   Assist Walk 10 feet on uneven surfaces activity did not occur: Safety/medical concerns (pain, weakness, dizziness)         Wheelchair     Assist Is the patient using a wheelchair?: Yes Type of Wheelchair: Manual Wheelchair activity did not occur: Safety/medical concerns (fatigue, weakness)  Wheelchair assist level: Supervision/Verbal cueing Max wheelchair distance: 55ft    Wheelchair 50 feet with 2 turns activity    Assist    Wheelchair 50 feet with 2 turns activity did not occur: Safety/medical concerns (fatigue, weakness)   Assist Level: Supervision/Verbal cueing   Wheelchair 150 feet activity     Assist  Wheelchair 150 feet activity did not occur: Safety/medical concerns (fatigue, weakness)   Assist Level: Moderate Assistance - Patient 50 - 74%   Blood pressure (!) 101/55, pulse 74, temperature 98.1 F (36.7 C), temperature source Oral, resp. rate 17, height 5\' 8"  (1.727 m), weight (!) 162.9 kg, SpO2 92 %.  Medical Problem List and Plan: 1. Functional deficits secondary to debility from CHF exacerbation             -patient may not shower due to wound vac. Outpatient wound ac ordered.              -ELOS/Goals: 12-16 days -supervision to min A  Grounds pass ordered  Continue CIR- PT and OT 2.  Impaired mobility: continue Heparin. Add SCDs.  3. Severe lumbar spinal stenosis: Provided pain relief journal. Tylenol, oxycodone  60 mg TID as needed- is home dose             -continue pregablin 100 mg daily- just restarted -1/20- will change Oxycodone to 60 mg TID- since is home regimen-  4. Mood/Behavior/Sleep: LCSW to evaluate and provide emotional support             -continue Cymbalta 60 mg BID             -antipsychotic agents: n/a   5. Neuropsych/cognition: This patient is capable of making decisions on his own behalf.   6. Abdominal incision: maintain wound vac.  7. Fluids/Electrolytes/Nutrition: Strict Is and Os and follow-up chemistries             -continue Prosource, Boost, iron supplements   8: HFmidrangeEF:             -daily weight             -weight up>>resuming Lasix 80 mg daily on 1/16             -losartan 12.5 mg daily resumed 1/16   Add additional 20mg  Lasix today 9: OSA on CPAP- uses home CPAP                10: Atrial fibrillation: persistent but alternating SR; s/p DCCV             -Eliquis held; general surgery to approve restarting- they said would determine 1/17?             -continue Pacerone 400 mg daily, metoprolol 100 mg daily   11: Class 3 obesity: BMI 56.21 1/16   12: Hypertension: monitor TID and prn             -continue Lasix 80 mg daily             -d/c losartan since currently hypotensive 13: Peripheral neuropathy/lumbar spinal stenosis/chronic pain             -neurologist Dr. Metta Clines -oxycodone 60 mg TID, lorazepam 0.5 mg, pregabalin 100 mg at home per Dr. Gar Ponto. Provided pain relief journal   14: GERD/dyspepsia: Protonix 40 mg q HS             -give Tums before meals   15: BPH: increase Flomax to home dose>> 0.8 mg daily   16: COPD (previous tobacco use): continue Yupelri nebs daily- wheezed at rest with exam exertion- pt refuses Albuterol per chart.    17: Leukocytosis: CT abdomen/pelvis ordered   18: Loose stool: d/c colace.   19. B/L Foot drop due to severe peripheral neuropathy- has AFO's but needs PRAFOs at bedtime- ordered   20. MASD-  at groin and skin folds/between legs- continue Nystatin, add Gerrhard's butt cream  21. Left shoulder pain: XR ordered and shows severe glenohumeral arthritis. Kpad ordered.   22. Dry skin: Eucerin ordered  23. Urinary urgency: purewick ordered for night, discussed with patient that he can stop using this when he feels ready.   24. Mild ileus: nausea resolved, advance to regular diet. Continue senna 2 tablets HS.   25. Abdominal wound: continue surgical consultation, appreciate surgery opening up false bottom of wound. continue wound vac. Training for wife to dressing changes at home will take place today. Home vac has been ordered  26. Intertriginous dermatitis: roho cushion ordered and as been approved, continue Desitin prn.   27. Hypokalemia: supplement K+. Repeat BMP on 1/26 is improved.      LOS: 10 days A FACE TO FACE EVALUATION WAS PERFORMED  Clide Deutscher Katlin Ciszewski 01/18/2023, 10:39 AM

## 2023-01-18 NOTE — Progress Notes (Signed)
Occupational Therapy Discharge Summary  Patient Details  Name: Timothy Macias MRN: [id number] Date of Birth: 1961-05-13  Date of Discharge from [place] service:January 18, 2023  Patient has met 7 of 9 long term goals due to improved activity tolerance, improved balance, ability to compensate for deficits, functional use of  LEFT upper extremity, and improved coordination.  Patient to discharge at overall Supervision to mod A level for self-care tasks.  Patient's care partner is independent to provide the necessary physical assistance at discharge. The wife (who is a Marine scientist) was assisting pt at the mod A level prior to rehab, and has completed family ed and has verbalized her readiness to assist pt at his CLOF.  Reasons goals not met: Bathing and toileting goals not met at supervision/min A level vs mod A due to high levels of pain, body habitus, pt reliance on wife for assist at baseline, and general strength and endurance deficits, requiring increased assist  Recommendation:  Patient will benefit from ongoing skilled OT services in home health setting to continue to advance functional skills in the area of BADL, iADL, and Reduce care partner burden.  Equipment: Already has all needed DME  Reasons for discharge: treatment goals met and discharge from hospital  Patient/family agrees with progress made and goals achieved: Yes  OT Discharge Precautions/Restrictions  Precautions Precautions: Fall;Other (comment) Precaution Comments: abdominal wound, bilateral AFOs Required Braces or Orthoses: Other Brace Other Brace: bilat AFO's Restrictions Weight Bearing Restrictions: No ADL ADL Eating: Set up Where Assessed-Eating: Wheelchair Grooming: Modified independent Where Assessed-Grooming: Sitting at sink Upper Body Bathing: Modified independent Where Assessed-Upper Body Bathing: Sitting at sink Lower Body Bathing: Moderate assistance Where Assessed-Lower Body Bathing: Bed level, Edge of  bed, Standing at sink Upper Body Dressing: Modified independent (Device) Where Assessed-Upper Body Dressing: Edge of bed Lower Body Dressing: Moderate assistance (seamless pants with overhead donning method) Where Assessed-Lower Body Dressing: Edge of bed Toileting: Minimal assistance Where Assessed-Toileting: Bedside Commode Toilet Transfer: Close supervision Toilet Transfer Method: Counselling psychologist: Extra wide Radiographer, therapeutic: Not assessed Tub/Shower Transfer Method: Unable to assess Social research officer, government: Not assessed Social research officer, government Method: Unable to assess ADL Comments: tub/shower and walk in shower transfers not assessed due to pt going home with wound vac and showers prohibited for several weeks Vision Baseline Vision/History: 1 Wears glasses Patient Visual Report: No change from baseline Vision Assessment?: No apparent visual deficits Perception  Perception: Within Functional Limits Praxis Praxis: Intact Cognition Cognition Overall Cognitive Status: Within Functional Limits for tasks assessed Arousal/Alertness: Awake/alert Orientation Level: Person;Place;Situation Person: Oriented Place: Oriented Situation: Oriented Memory: Appears intact Awareness: Appears intact Problem Solving: Appears intact Safety/Judgment: Appears intact Brief Interview for Mental Status (BIMS) Repetition of Three Words (First Attempt): 3 Temporal Orientation: Year: Correct Temporal Orientation: Month: Accurate within 5 days Temporal Orientation: Day: Correct Recall: "Sock": Yes, no cue required Recall: "Blue": Yes, no cue required Recall: "Bed": Yes, no cue required BIMS Summary Score: 15 Sensation Sensation Light Touch: Impaired Detail Proprioception: Impaired by gross assessment Proprioception Impaired Details: Impaired RLE;Impaired LLE Additional Comments: decreased sensation along L L3 dermatome >R; then absent sensation from L3  dermatone and below bilaterally Coordination Gross Motor Movements are Fluid and Coordinated: No Fine Motor Movements are Fluid and Coordinated: Yes Coordination and Movement Description: grossly uncoordinated due to neuropathy, decreased balance/coordination, generalized weakness, and poor endurance Finger Nose Finger Test: [facility] bilaterally Heel Shin Test: unable to perform bilaterally and decreased ROM Motor  Motor Motor: Other (  comment) Motor - Skilled Clinical Observations: uncoordinated due to neuropathy affecting sensation of LEs, decreased balance/coordination, generalized weakness, and poor endurance Mobility  Bed Mobility Bed Mobility: Rolling Right;Rolling Left;Supine to Sit;Sit to Supine Rolling Right: Supervision/verbal cueing (bedrails) Rolling Left: Supervision/Verbal cueing (bedrails) Supine to Sit: Supervision/Verbal cueing (HOB elevated and use of bedrails) Sit to Supine: Supervision/Verbal cueing (bedrails) Transfers Sit to Stand: Supervision/Verbal cueing Stand to Sit: Supervision/Verbal cueing  Trunk/Postural Assessment  Cervical Assessment Cervical Assessment: Exceptions to [facility] (forward head) Thoracic Assessment Thoracic Assessment: Exceptions to [facility] (thoracic rounding) Lumbar Assessment Lumbar Assessment: Exceptions to [facility] (anterior pelvic tilt) Postural Control Postural Control: Deficits on evaluation Protective Responses: altered due to neuropathy and hx of dizziness with prolonged standing at baseline  Balance Balance Balance Assessed: Yes Static Sitting Balance Static Sitting - Balance Support: Feet supported;Bilateral upper extremity supported Static Sitting - Level of Assistance: 6: Modified independent (Device/Increase time) Dynamic Sitting Balance Dynamic Sitting - Balance Support: Feet supported;No upper extremity supported Dynamic Sitting - Level of Assistance: 5: Stand by assistance (supervision) Static Standing Balance Static Standing -  Balance Support: Bilateral upper extremity supported;During functional activity (bari RW) Static Standing - Level of Assistance: 5: Stand by assistance (supervision) Dynamic Standing Balance Dynamic Standing - Balance Support: Bilateral upper extremity supported;During functional activity (bari RW) Dynamic Standing - Level of Assistance: 5: Stand by assistance (supervision) Dynamic Standing - Comments: with transfers and gait Extremity/Trunk Assessment RUE Assessment RUE Assessment: Within Functional Limits LUE Assessment LUE Assessment: Within Functional Limits Active Range of Motion (AROM) Comments: Improved to 120 degrees shoulder flexion General Strength Comments: 4-/5 grossly   Andrick Rust E Ethie Curless, MS, OTR/L  01/18/2023, 3:33 PM

## 2023-01-18 NOTE — Progress Notes (Signed)
   Progress Note     Subjective: VAC changed at bedside with [place] RN and patient tolerated well. Discharge planned for tomorrow. Tolerating diet and having bowel function.   Objective: Vital signs in last 24 hours: Temp:  [97.9 F (36.6 C)-98.1 F (36.7 C)] 98.1 F (36.7 C) (01/26 0508) Pulse Rate:  [64-94] 74 (01/26 0508) Resp:  [17-18] 17 (01/26 0508) BP: (91-117)/(55-67) 101/55 (01/26 0508) SpO2:  [92 %-97 %] 92 % (01/26 0849) Weight:  [162.9 kg] 162.9 kg (01/26 0500) Last BM Date : 01/17/23  Intake/Output from previous day: 01/25 0701 - 01/26 0700 In: 877 [P.O.:877] Out: 4900 [Urine:4550; Drains:350] Intake/Output this shift: Total I/O In: 240 [P.O.:240] Out: 425 [Urine:425]  PE: General: pleasant, WD, obese male who is laying in bed in NAD Heart: regular, rate, and rhythm.  Lungs: Respiratory effort nonlabored Abd: soft, appropriately ttp, midline wound with VAC present and SS fluid in canister. Wound >90% granulation tissue. There was a false bottom centrally that I opened, small tunnel inferiorly closing down. Fascia intact Psych: A&Ox3 with an appropriate affect.    Lab Results:  No results for input(s): "WBC", "HGB", "HCT", "PLT" in the last 72 hours.  BMET Recent Labs    01/16/23 0532  NA 130*  K 3.0*  CL 94*  CO2 27  GLUCOSE 116*  BUN 32*  CREATININE 1.45*  CALCIUM 8.2*    PT/INR No results for input(s): "LABPROT", "INR" in the last 72 hours. CMP     Component Value Date/Time   NA 130 (L) 01/16/2023 0532   K 3.0 (L) 01/16/2023 0532   CL 94 (L) 01/16/2023 0532   CO2 27 01/16/2023 0532   GLUCOSE 116 (H) 01/16/2023 0532   BUN 32 (H) 01/16/2023 0532   CREATININE 1.45 (H) 01/16/2023 0532   CALCIUM 8.2 (L) 01/16/2023 0532   PROT 6.5 01/09/2023 0846   ALBUMIN 3.1 (L) 01/09/2023 0846   AST 36 01/09/2023 0846   ALT 36 01/09/2023 0846   ALKPHOS 63 01/09/2023 0846   BILITOT 1.0 01/09/2023 0846   GFRNONAA 55 (L) 01/16/2023 0532   Lipase  No  results found for: "LIPASE"     Studies/Results: No results found.  Anti-infectives: Anti-infectives (From admission, onward)    None        Assessment/Plan  POD16, s/p ex lap with LOA and SBR for SBO, Dr. Donne Hazel 1/10 - reg diet - continue VAC and change M/W/F - mobilize with nursing and PT - IS, pulm toilet - continue chronic home pain meds  - CT 1/17 without significant post-op complication  - bowel regimen  - putting follow up and surgical post-op instructions in AVS, agree with discharge    FEN - soft diet  VTE - SQH, eliquis ID - completed 24 hrs post op abx   - below per rehab MD -  Afib T. CHF HTN OSA NASH Acute on CKD  Morbid obesity  Heel wounds   LOS: 10 days    Norm Parcel, [facility] 01/18/2023, 11:17 AM Please see Amion for pager number during day hours 7:00am-4:30pm

## 2023-01-18 NOTE — Consult Note (Signed)
[place] Nurse wound follow up Patient seen and NPWT changed with Barkley Boards, PA-C [facility] Wound type: surgical  Measurement:15 cms x 7.5 cms x 6 cms  Wound bed: 80% red, 15% yellow fibrin, 5% tan non-viable tissue superior portion of wound  Drainage (amount, consistency, odor) small amount of serosanguinous distal aspect of wound  Periwound: intact  Dressing procedure/placement/frequency: Removed old NPWT dressing Cleansed wound with normal saline Filled wound with  _1__ piece of black foam  Sealed NPWT dressing at 170mm HG Patient received PO pain medication per bedside nurse prior to dressing change Patient tolerated procedure well  [place] nurse will continue to provide NPWT dressing changed due to the complexity of the dressing change. Patient plans to be discharged 01/19/2023.  If not discharged Mina will plan to change NPWT Monday 01/21/2023.   Thanks,   [employer] MSN, RN-BC, [employer]

## 2023-01-18 NOTE — Progress Notes (Signed)
Inpatient Rehabilitation Care Coordinator Discharge Note   Patient Details  Name: Timothy Macias MRN: [id number] Date of Birth: 1961/08/12   Discharge location: Home  Length of Stay: 11 Days  Discharge activity level: MOD I/Sup  Home/community participation: Spouse  Patient response [id number] Literacy - How often do you need to have someone help you when you read instructions, pamphlets, or other written material from your doctor or pharmacy?: Never  Patient response [id number] Isolation - How often do you feel lonely or isolated from those around you?: Never  Services provided included: MD, RD, PT, OT, SLP, RN, CM, TR, Pharmacy, Neuropsych, SW  Financial Services:  Charity fundraiser Utilized: [place] offered to/list presented to: Patient  Follow-up services arranged:  [place]: [place]         Patient response to transportation need: Is the patient able to respond to transportation needs?: Yes In the past 12 months, has lack of transportation kept you from medical appointments or from getting medications?: No In the past 12 months, has lack of transportation kept you from meetings, work, or from getting things needed for daily living?: No    Comments (or additional information):  Patient/Family verbalized understanding of follow-up arrangements:  Yes  Individual responsible for coordination of the follow-up plan: spouse, [name]  Confirmed correct DME delivered: [name] 01/18/2023    [name]

## 2023-01-18 NOTE — Progress Notes (Signed)
Patient ID: Timothy Macias, male   DOB: August 29, 1961, 62 y.o.   MRN: [id number]  New dx accepted by [facility] to [place]. Patient order accept successfully. Cushion set to be delivered to pt's home.

## 2023-01-18 NOTE — Progress Notes (Signed)
Physical Therapy Discharge Summary  Patient Details  Name: Timothy Macias MRN: [id number] Date of Birth: 03-07-1961  Date of Discharge from PT service:January 18, 2023  Today's Date: 01/18/2023 PT Individual Time: 0730-0825 PT Individual Time Calculation (min): 55 min   Patient has met 6 of 9 long term goals due to improved activity tolerance, improved balance, improved postural control, increased strength, ability to compensate for deficits, improved awareness, and improved coordination. Patient to discharge at a wheelchair level Supervision.  Patient's care partner is independent to provide the necessary physical assistance at discharge. Pt's wife attended family education training on 1/23 and verbalized and demonstrated confidence with all tasks to ensure safe discharge home. Pt requesting to D/C sooner than original D/C date.   Reasons goals not met: Pt did not meet car transfer goal of CGA, as pt unable to get LEs into simulated car due to lack of space and pain in buttocks and abdomen - however, pt reports his wife's car will have more leg room and verbally talked through technique he will use of get in car with help from his wife for LE management. Pt did not meet gait goal of 28ft with supervision as pt is currently only able to ambulate up to 23ft with supervision due to fatigue, weakness, pain, and dizziness. Pt also did not meet WC mobility goal of 32ft as pt currently only able to propel up to 25ft due to fatigue and UE weakness.   Recommendation:  Patient will benefit from ongoing skilled PT services in home health setting to continue to advance safe functional mobility, address ongoing impairments in transfers, generalized strengthening and endurance, dynamic standing balance/coordination, gait training, and to minimize fall risk.  Equipment: 24x18 Roho cushion - has all other equipment  Reasons for discharge: treatment goals met and discharge from hospital  Patient/family  agrees with progress made and goals achieved: Yes  Today's Interventions: Received pt semi-reclined in bed, pt agreeable to PT treatment, and reported pain 8/10 in buttocks and abdomen - RN notified to administer pain medication. Session with emphasis on functional mobility/transfers, generalized strengthening and endurance, and gait training. Pt transferred semi-reclined<>sitting EOB with HOB elevated and use of bedrails with min A to roll onto R side but supervision to transfer R sidelying<>sitting EOB  - pt reports at baseline his wife was assisting with bed mobility. Per OT, pt has bed able to sit upright with as little as supervision. Donned bilateral AFO's with total A and pt performed all transfers with bari RW and supervision throughout session (assist to hold brief up). Pt ambulated 93ft with bari RW and supervision (CGA to hold brief up) but limited by pain and soreness in BLEs and BUEs from ambulating so much yesterday and politely requested to be done with ambulation today. Pt sat in WC at sink and brushed teeth, washed face, and washed hair with set up assist and increased time. Pt then performed seated hamstring curls with red TB 2x12 bilaterally and seated hip abduction with red TB 2x20 and RN arrived to reassess vitals and administer medications - BP: 114/86. Pt transferred back to bed with bari RW and supervision and transferred sit<>supine with supervision and increased time/effort with use of bedrails. Concluded session with pt semi-reclined in bed, needs within reach, and bed alarm on. [place] RN present at bedside for wound vac education.   PT Discharge Precautions/Restrictions Precautions Precautions: Fall;Other (comment) Precaution Comments: abdominal wound, bilateral AFOs Required Braces or Orthoses: Other Brace Other Brace: bilat AFO's Restrictions  Weight Bearing Restrictions: No Pain Interference Pain Interference Pain Effect on Sleep: 1. Rarely or not at all Pain Interference  with Therapy Activities: 2. Occasionally Pain Interference with Day-to-Day Activities: 1. Rarely or not at all Cognition Overall Cognitive Status: Within Functional Limits for tasks assessed Arousal/Alertness: Awake/alert Orientation Level: Oriented X4 Memory: Appears intact Awareness: Appears intact Problem Solving: Appears intact Safety/Judgment: Appears intact Sensation Sensation Light Touch: Impaired Detail Proprioception: Impaired by gross assessment Proprioception Impaired Details: Impaired RLE;Impaired LLE Additional Comments: decreased sensation along L L3 dermatome >R; then absent sensation from L3 dermatone and below bilaterally Coordination Gross Motor Movements are Fluid and Coordinated: No Fine Motor Movements are Fluid and Coordinated: Yes Coordination and Movement Description: grossly uncoordinated due to neuropathy, decreased balance/coordination, generalized weakness, and poor endurance Finger Nose Finger Test: [employer] bilaterally Heel Shin Test: unable to perform bilaterally and decreased ROM Motor  Motor Motor: Other (comment) Motor - Skilled Clinical Observations: uncoordinated due to neuropathy affecting sensation of LEs, decreased balance/coordination, generalized weakness, and poor endurance  Mobility Bed Mobility Bed Mobility: Rolling Right;Rolling Left;Supine to Sit;Sit to Supine Rolling Right: Supervision/verbal cueing (bedrails) Rolling Left: Supervision/Verbal cueing (bedrails) Supine to Sit: Supervision/Verbal cueing (HOB elevated and use of bedrails) Sit to Supine: Supervision/Verbal cueing (bedrails) Transfers Transfers: Sit to Stand;Stand to Sit;Stand Pivot Transfers Sit to Stand: Supervision/Verbal cueing Stand to Sit: Supervision/Verbal cueing Stand Pivot Transfers: Supervision/Verbal cueing Transfer (Assistive device): Rolling walker (bari) Locomotion  Gait Ambulation: Yes Gait Assistance: Supervision/Verbal cueing Gait Distance (Feet): 35  Feet Assistive device: Rolling walker Gait Assistance Details: Verbal cues for precautions/safety Gait Assistance Details: verbal cues for energy conservation Gait Gait: Yes Gait Pattern: Impaired Gait Pattern: Step-to pattern;Step-through pattern;Decreased step length - right;Decreased step length - left;Decreased stride length;Poor foot clearance - right;Poor foot clearance - left;Trunk flexed;Antalgic Gait velocity: decreased Stairs / Additional Locomotion Stairs: No Corporate treasurer: Yes Wheelchair Assistance: Doctor, general practice: Both upper extremities;Both lower extermities Wheelchair Parts Management: Needs assistance Distance: 39ft  Trunk/Postural Assessment  Cervical Assessment Cervical Assessment: Exceptions to [facility] (forward head) Thoracic Assessment Thoracic Assessment: Exceptions to [facility] (thoracic rounding) Lumbar Assessment Lumbar Assessment: Exceptions to [facility] (anterior pelvic tilt) Postural Control Postural Control: Deficits on evaluation Protective Responses: altered due to neuropathy and hx of dizziness with prolonged standing at baseline  Balance Balance Balance Assessed: Yes Static Sitting Balance Static Sitting - Balance Support: Feet supported;Bilateral upper extremity supported Static Sitting - Level of Assistance: 6: Modified independent (Device/Increase time) Dynamic Sitting Balance Dynamic Sitting - Balance Support: Feet supported;No upper extremity supported Dynamic Sitting - Level of Assistance: 5: Stand by assistance (supervision) Static Standing Balance Static Standing - Balance Support: Bilateral upper extremity supported;During functional activity (bari RW) Static Standing - Level of Assistance: 5: Stand by assistance (supervision) Dynamic Standing Balance Dynamic Standing - Balance Support: Bilateral upper extremity supported;During functional activity (bari RW) Dynamic Standing - Level of  Assistance: 5: Stand by assistance (supervision) Dynamic Standing - Comments: with transfers and gait Extremity Assessment  RLE Assessment RLE Assessment: Exceptions to [facility] General Strength Comments: tested in sitting RLE Strength Right Hip Flexion: 3+/5 Right Hip ABduction: 3+/5 Right Hip ADduction: 3+/5 Right Knee Flexion: 4-/5 Right Knee Extension: 4-/5 Right Ankle Dorsiflexion: 0/5 Right Ankle Plantar Flexion: 0/5 LLE Assessment LLE Assessment: Exceptions to [facility] General Strength Comments: tested in sitting LLE Strength Left Hip Flexion: 3+/5 Left Hip ABduction: 3+/5 Left Hip ADduction: 3+/5 Left Knee Flexion: 3+/5 Left Knee Extension: 4-/5 Left Ankle Dorsiflexion: 0/5 Left  Ankle Plantar Flexion: 0/5   Halsey, DPT  01/18/2023, 7:14 AM

## 2023-01-18 NOTE — Progress Notes (Signed)
Recreational Therapy Discharge Summary Patient Details  Name: Timothy Macias MRN: [id number] Date of Birth: 01-29-1961 Today's Date: 01/18/2023 Comments on progress toward goals: pt has made good progress during LOS and is discharging home with family to provide the needed supervision/assistance.  TR sessions focused on animal assisted activities, leisure education, activity analysis with potential modifications, & coping/stress management.  Pt remains optimistic about continued recovery and is anxious to return home. Reasons for discharge: discharge from hospital  Follow-up: [place] agrees with progress made and goals achieved: Yes  Darrell Leonhardt 01/18/2023, 1:49 PM

## 2023-01-19 DIAGNOSIS — M21371 Foot drop, right foot: Secondary | ICD-10-CM

## 2023-01-19 DIAGNOSIS — M21372 Foot drop, left foot: Secondary | ICD-10-CM

## 2023-01-19 DIAGNOSIS — E876 Hypokalemia: Secondary | ICD-10-CM

## 2023-01-19 NOTE — Progress Notes (Signed)
Patient discharged this shift by staff with spouse by side. Wound vac on and intact. Patient and spouse voiced understanding of discharge instruction.

## 2023-01-19 NOTE — Progress Notes (Signed)
PROGRESS NOTE   Subjective/Complaints: Pain controlled. Feels prepared for dc today. Reports dc instructions reviewed and understood.   ROS: Patient denies fever, rash, sore throat, blurred vision, dizziness, nausea, vomiting, diarrhea, cough, shortness of breath or chest pain,   headache, or mood change.   Objective:   No results found. No results for input(s): "WBC", "HGB", "HCT", "PLT" in the last 72 hours.  Recent Labs    01/18/23 1123  NA 132*  K 3.7  CL 95*  CO2 28  GLUCOSE 109*  BUN 35*  CREATININE 1.30*  CALCIUM 8.8*     Intake/Output Summary (Last 24 hours) at 01/19/2023 1610 Last data filed at 01/19/2023 0800 Gross per 24 hour  Intake 720 ml  Output 1275 ml  Net -555 ml        Physical Exam: Vital Signs Blood pressure (!) 104/59, pulse 90, temperature 98 F (36.7 C), temperature source Oral, resp. rate 17, height 5\' 8"  (1.727 m), weight (!) 164.7 kg, SpO2 (!) 87 %.  . Physical Exam  Constitutional: No distress . Vital signs reviewed. HEENT: NCAT, EOMI, oral membranes moist Neck: supple Cardiovascular: RRR without murmur. No JVD    Respiratory/Chest: CTA Bilaterally without wheezes or rales. Normal effort    GI/Abdomen: BS +, non-tender, non-distended Ext: no clubbing, cyanosis, or edema Psych: pleasant and cooperative  Skin: intertriginous dermatitis of sacrum, wound VAC on abd- vertical - looks stable- has good suction Musculoskeletal:     Cervical back: Neck supple. No tenderness.     Comments: Ues 4th/5th digit of L hand flexed at MCP and PIP Ue's 5-/5 in arms except FA 4-/5 B/L   LE's HF 4-/5; KE 4+/5 B/L, DF 2-/5 and PF 2-/5 B/L B/L foot drop  Skin:    General: Skin is warm and dry.     Comments: Heels not viewed.  Has mild MASD between legs, skin folds and groin, wound with >80% granulation tissue Neurological:     Mental Status: He is alert and oriented to person, place, and time.      Sensory: Sensory deficit present.     Comments: Severe neuropathy in legs B/L, finger tips also with reduced sensation  Has absent sensation to light touch to knees B/L- with foot atrophy B/L. Wearing prafo's.           Assessment/Plan: 1. Functional deficits which require 3+ hours per day of interdisciplinary therapy in a comprehensive inpatient rehab setting. Physiatrist is providing close team supervision and 24 hour management of active medical problems listed below. Physiatrist and rehab team continue to assess barriers to discharge/monitor patient progress toward functional and medical goals  Care Tool:  Bathing    Body parts bathed by patient: Right arm, Left arm, Chest, Abdomen, Right upper leg, Left upper leg, Face   Body parts bathed by helper: Front perineal area, Buttocks, Right lower leg, Left lower leg     Bathing assist Assist Level: Moderate Assistance - Patient 50 - 74%     Upper Body Dressing/Undressing Upper body dressing   What is the patient wearing?: Pull over shirt    Upper body assist Assist Level: Independent with assistive device  Lower Body Dressing/Undressing Lower body dressing      What is the patient wearing?: Pants (donned overhead)     Lower body assist Assist for lower body dressing: Supervision/Verbal cueing     Toileting Toileting    Toileting assist Assist for toileting: Moderate Assistance - Patient 50 - 74%     Transfers Chair/bed transfer  Transfers assist     Chair/bed transfer assist level: Supervision/Verbal cueing     Locomotion Ambulation   Ambulation assist   Ambulation activity did not occur: Safety/medical concerns (fatigue, dizziness, weakness, decreased balance)  Assist level: Supervision/Verbal cueing Assistive device: Walker-rolling Max distance: 46ft   Walk 10 feet activity   Assist  Walk 10 feet activity did not occur: Safety/medical concerns (fatigue, dizziness, weakness, decreased  balance)  Assist level: Supervision/Verbal cueing Assistive device: Walker-rolling   Walk 50 feet activity   Assist Walk 50 feet with 2 turns activity did not occur: Safety/medical concerns (pain, weakness, dizziness)         Walk 150 feet activity   Assist Walk 150 feet activity did not occur: Safety/medical concerns (pain, weakness, dizziness)         Walk 10 feet on uneven surface  activity   Assist Walk 10 feet on uneven surfaces activity did not occur: Safety/medical concerns (pain, weakness, dizziness)         Wheelchair     Assist Is the patient using a wheelchair?: Yes Type of Wheelchair: Manual Wheelchair activity did not occur: Safety/medical concerns (fatigue, weakness)  Wheelchair assist level: Supervision/Verbal cueing Max wheelchair distance: 64ft    Wheelchair 50 feet with 2 turns activity    Assist    Wheelchair 50 feet with 2 turns activity did not occur: Safety/medical concerns (fatigue, weakness)   Assist Level: Supervision/Verbal cueing   Wheelchair 150 feet activity     Assist  Wheelchair 150 feet activity did not occur: Safety/medical concerns (fatigue, weakness)   Assist Level: Moderate Assistance - Patient 50 - 74%   Blood pressure (!) 104/59, pulse 90, temperature 98 F (36.7 C), temperature source Oral, resp. rate 17, height 5\' 8"  (1.727 m), weight (!) 164.7 kg, SpO2 (!) 87 %.  Medical Problem List and Plan: 1. Functional deficits secondary to debility from CHF exacerbation             -patient may not shower due to wound vac. Outpatient wound ac ordered.              -dc home today. F/u with [facility] 2.  Impaired mobility: continue Heparin. Add SCDs.  3. Severe lumbar spinal stenosis: Provided pain relief journal. Tylenol, oxycodone 60 mg TID as needed- is home dose             -continue pregablin 100 mg daily- just restarted -1/20- changed Oxycodone to 60 mg TID- since is home regimen-  4. Mood/Behavior/Sleep:  LCSW to evaluate and provide emotional support             -continue Cymbalta 60 mg BID             -antipsychotic agents: n/a   5. Neuropsych/cognition: This patient is capable of making decisions on his own behalf.   6. Abdominal incision: maintain wound vac.    7. Fluids/Electrolytes/Nutrition: Strict Is and Os and follow-up chemistries             -continue Prosource, Boost, iron supplements   8: HFmidrangeEF:             -  daily weight             -weight up>>resuming Lasix 80 mg daily on 1/16             -losartan 12.5 mg daily resumed 1/16   Added additional 20mg  Lasix   9: OSA on CPAP- uses home CPAP                10: Atrial fibrillation: persistent but alternating SR; s/p DCCV             -Eliquis held; general surgery to approve restarting- they said would determine 1/17?             -continue Pacerone 400 mg daily, metoprolol 100 mg daily   11: Class 3 obesity: BMI 56.21 1/16   12: Hypertension: monitor TID and prn             -continue Lasix 80 mg daily             -d/c losartan since currently hypotensive 13: Peripheral neuropathy/lumbar spinal stenosis/chronic pain             -neurologist Dr. Metta Clines -oxycodone 60 mg TID, lorazepam 0.5 mg, pregabalin 100 mg at home per Dr. Gar Ponto. Provided pain relief journal   14: GERD/dyspepsia: Protonix 40 mg q HS             -give Tums before meals   15: BPH: increase Flomax to home dose>> 0.8 mg daily   16: COPD (previous tobacco use): continue Yupelri nebs daily- wheezed at rest with exam exertion- pt refuses Albuterol per chart.    17: Leukocytosis: CT abdomen/pelvis ordered   18: Loose stool: d/c colace.   19. B/L Foot drop due to severe peripheral neuropathy- has AFO's but needs PRAFOs at bedtime- pt is wearing   20. MASD- at groin and skin folds/between legs- continue Nystatin, add Gerrhard's butt cream  21. Left shoulder pain: XR ordered and shows severe glenohumeral arthritis. Kpad ordered.   22.  Dry skin: Eucerin ordered  23. Urinary urgency: purewick ordered for night, discussed with patient that he can stop using this when he feels ready.   24. Mild ileus: nausea resolved, advance to regular diet. Continue senna 2 tablets HS.   25. Abdominal wound: continue surgical consultation, appreciate surgery opening up false bottom of wound. continue wound vac. Training for wife to dressing changes at home will take place today. Home vac has been ordered  26. Intertriginous dermatitis: roho cushion ordered and as been approved, continue Desitin prn.   27. Hypokalemia: supplement K+. Repeat BMP on 1/26 better     LOS: 11 days A FACE TO [place] 01/19/2023, 8:22 AM

## 2023-02-01 DIAGNOSIS — Z79899 Other long term (current) drug therapy: Secondary | ICD-10-CM | POA: Diagnosis not present

## 2023-02-01 DIAGNOSIS — R000 Tachycardia, unspecified: Secondary | ICD-10-CM | POA: Diagnosis not present

## 2023-02-01 DIAGNOSIS — T782XXA Anaphylactic shock, unspecified, initial encounter: Secondary | ICD-10-CM | POA: Diagnosis not present

## 2023-02-01 DIAGNOSIS — Z20822 Contact with and (suspected) exposure to covid-19: Secondary | ICD-10-CM | POA: Diagnosis not present

## 2023-02-01 DIAGNOSIS — F32A Depression, unspecified: Secondary | ICD-10-CM | POA: Diagnosis not present

## 2023-02-01 DIAGNOSIS — I4891 Unspecified atrial fibrillation: Secondary | ICD-10-CM | POA: Diagnosis not present

## 2023-02-01 DIAGNOSIS — Z885 Allergy status to narcotic agent status: Secondary | ICD-10-CM | POA: Diagnosis not present

## 2023-02-01 DIAGNOSIS — K2100 Gastro-esophageal reflux disease with esophagitis, without bleeding: Secondary | ICD-10-CM | POA: Diagnosis not present

## 2023-02-01 DIAGNOSIS — Z880 Allergy status to penicillin: Secondary | ICD-10-CM | POA: Diagnosis not present

## 2023-02-01 DIAGNOSIS — R059 Cough, unspecified: Secondary | ICD-10-CM | POA: Diagnosis not present

## 2023-02-01 DIAGNOSIS — Z7901 Long term (current) use of anticoagulants: Secondary | ICD-10-CM | POA: Diagnosis not present

## 2023-02-01 DIAGNOSIS — Z87891 Personal history of nicotine dependence: Secondary | ICD-10-CM | POA: Diagnosis not present

## 2023-02-01 DIAGNOSIS — U071 COVID-19: Secondary | ICD-10-CM | POA: Diagnosis not present

## 2023-02-01 DIAGNOSIS — I509 Heart failure, unspecified: Secondary | ICD-10-CM | POA: Diagnosis not present

## 2023-02-01 DIAGNOSIS — T7840XA Allergy, unspecified, initial encounter: Secondary | ICD-10-CM | POA: Diagnosis not present

## 2023-02-01 DIAGNOSIS — Z888 Allergy status to other drugs, medicaments and biological substances status: Secondary | ICD-10-CM | POA: Diagnosis not present

## 2023-02-01 DIAGNOSIS — R0689 Other abnormalities of breathing: Secondary | ICD-10-CM | POA: Diagnosis not present

## 2023-02-01 DIAGNOSIS — R0902 Hypoxemia: Secondary | ICD-10-CM | POA: Diagnosis not present

## 2023-02-01 DIAGNOSIS — I110 Hypertensive heart disease with heart failure: Secondary | ICD-10-CM | POA: Diagnosis not present

## 2023-02-02 DIAGNOSIS — R059 Cough, unspecified: Secondary | ICD-10-CM | POA: Diagnosis not present

## 2023-02-18 DIAGNOSIS — Z6841 Body Mass Index (BMI) 40.0 and over, adult: Secondary | ICD-10-CM | POA: Diagnosis not present

## 2023-02-18 DIAGNOSIS — I4891 Unspecified atrial fibrillation: Secondary | ICD-10-CM | POA: Diagnosis not present

## 2023-02-18 DIAGNOSIS — I5030 Unspecified diastolic (congestive) heart failure: Secondary | ICD-10-CM | POA: Diagnosis not present

## 2023-02-22 DIAGNOSIS — F410 Panic disorder [episodic paroxysmal anxiety] without agoraphobia: Secondary | ICD-10-CM | POA: Diagnosis not present

## 2023-02-26 DIAGNOSIS — I130 Hypertensive heart and chronic kidney disease with heart failure and stage 1 through stage 4 chronic kidney disease, or unspecified chronic kidney disease: Secondary | ICD-10-CM | POA: Diagnosis not present

## 2023-02-26 DIAGNOSIS — J449 Chronic obstructive pulmonary disease, unspecified: Secondary | ICD-10-CM | POA: Diagnosis not present

## 2023-02-26 DIAGNOSIS — I4811 Longstanding persistent atrial fibrillation: Secondary | ICD-10-CM | POA: Diagnosis not present

## 2023-02-26 DIAGNOSIS — I5020 Unspecified systolic (congestive) heart failure: Secondary | ICD-10-CM | POA: Diagnosis not present

## 2023-02-26 DIAGNOSIS — Z48815 Encounter for surgical aftercare following surgery on the digestive system: Secondary | ICD-10-CM | POA: Diagnosis not present

## 2023-02-26 DIAGNOSIS — N189 Chronic kidney disease, unspecified: Secondary | ICD-10-CM | POA: Diagnosis not present

## 2023-02-26 DIAGNOSIS — M48061 Spinal stenosis, lumbar region without neurogenic claudication: Secondary | ICD-10-CM | POA: Diagnosis not present

## 2023-02-26 DIAGNOSIS — G629 Polyneuropathy, unspecified: Secondary | ICD-10-CM | POA: Diagnosis not present

## 2023-05-25 DEATH — deceased
